# Patient Record
Sex: Male | Born: 1935 | Race: White | Hispanic: No | Marital: Married | Smoking: Former smoker
Health system: Southern US, Community
[De-identification: ages and names within clinical notes are randomized; demographics above are authoritative.]

## PROBLEM LIST (undated history)

## (undated) DIAGNOSIS — N189 Chronic kidney disease, unspecified: Secondary | ICD-10-CM

## (undated) DIAGNOSIS — F17201 Nicotine dependence, unspecified, in remission: Secondary | ICD-10-CM

## (undated) DIAGNOSIS — Z794 Long term (current) use of insulin: Secondary | ICD-10-CM

## (undated) DIAGNOSIS — I10 Essential (primary) hypertension: Secondary | ICD-10-CM

## (undated) DIAGNOSIS — E785 Hyperlipidemia, unspecified: Secondary | ICD-10-CM

## (undated) DIAGNOSIS — E119 Type 2 diabetes mellitus without complications: Secondary | ICD-10-CM

## (undated) DIAGNOSIS — I2510 Atherosclerotic heart disease of native coronary artery without angina pectoris: Secondary | ICD-10-CM

## (undated) DIAGNOSIS — N400 Enlarged prostate without lower urinary tract symptoms: Secondary | ICD-10-CM

## (undated) DIAGNOSIS — I480 Paroxysmal atrial fibrillation: Secondary | ICD-10-CM

## (undated) DIAGNOSIS — N200 Calculus of kidney: Secondary | ICD-10-CM

## (undated) HISTORY — DX: Paroxysmal atrial fibrillation: I48.0

## (undated) HISTORY — DX: Hyperlipidemia, unspecified: E78.5

## (undated) HISTORY — DX: Type 2 diabetes mellitus without complications: E11.9

## (undated) HISTORY — PX: OTHER SURGICAL HISTORY: SHX169

## (undated) HISTORY — DX: Essential (primary) hypertension: I10

## (undated) HISTORY — DX: Type 2 diabetes mellitus without complications: Z79.4

## (undated) HISTORY — DX: Nicotine dependence, unspecified, in remission: F17.201

## (undated) HISTORY — DX: Benign prostatic hyperplasia without lower urinary tract symptoms: N40.0

## (undated) HISTORY — DX: Atherosclerotic heart disease of native coronary artery without angina pectoris: I25.10

## (undated) HISTORY — DX: Calculus of kidney: N20.0

## (undated) HISTORY — DX: Chronic kidney disease, unspecified: N18.9

## (undated) HISTORY — PX: TRANSURETHRAL RESECTION OF PROSTATE: SHX73

---

## 2001-04-01 ENCOUNTER — Encounter: Payer: Self-pay | Admitting: Cardiology

## 2001-05-13 ENCOUNTER — Ambulatory Visit (HOSPITAL_COMMUNITY): Admission: RE | Admit: 2001-05-13 | Discharge: 2001-05-13 | Payer: Self-pay | Admitting: Cardiology

## 2002-01-20 ENCOUNTER — Ambulatory Visit (HOSPITAL_COMMUNITY): Admission: RE | Admit: 2002-01-20 | Discharge: 2002-01-20 | Payer: Self-pay | Admitting: Cardiology

## 2004-02-03 ENCOUNTER — Ambulatory Visit: Payer: Self-pay | Admitting: Cardiology

## 2004-02-04 ENCOUNTER — Ambulatory Visit (HOSPITAL_COMMUNITY): Admission: RE | Admit: 2004-02-04 | Discharge: 2004-02-04 | Payer: Self-pay | Admitting: Cardiology

## 2004-03-24 ENCOUNTER — Inpatient Hospital Stay (HOSPITAL_COMMUNITY): Admission: AD | Admit: 2004-03-24 | Discharge: 2004-03-30 | Payer: Self-pay | Admitting: Family Medicine

## 2004-04-20 ENCOUNTER — Ambulatory Visit (HOSPITAL_COMMUNITY): Admission: RE | Admit: 2004-04-20 | Discharge: 2004-04-20 | Payer: Self-pay | Admitting: Family Medicine

## 2004-05-01 ENCOUNTER — Ambulatory Visit (HOSPITAL_COMMUNITY): Admission: RE | Admit: 2004-05-01 | Discharge: 2004-05-01 | Payer: Self-pay | Admitting: Family Medicine

## 2005-02-08 ENCOUNTER — Ambulatory Visit: Payer: Self-pay | Admitting: Cardiology

## 2005-08-12 IMAGING — CT CT CHEST W/ CM
1 of 2 series · 14 of 30 positions shown, 18 images · IV contrast (CONTRAST)
Comparison: Prior chest radiograph on 03/27/2004.

CLINICAL DATA: Recent pneumonia.  Right hilar mass seen on recent chest radiograph. 
 CHEST CT WITH CONTRAST:
TECHNIQUE: Multidetector CT imaging of the chest was performed during administration of 100 cc Omnipaque 300 intravenous contrast.

[Series 298: — · axial · 0.76mm/px · z∈[+1638,+1893]mm · 14 of 61 slices shown, 18 images]
[im 5/61  mediastinal]
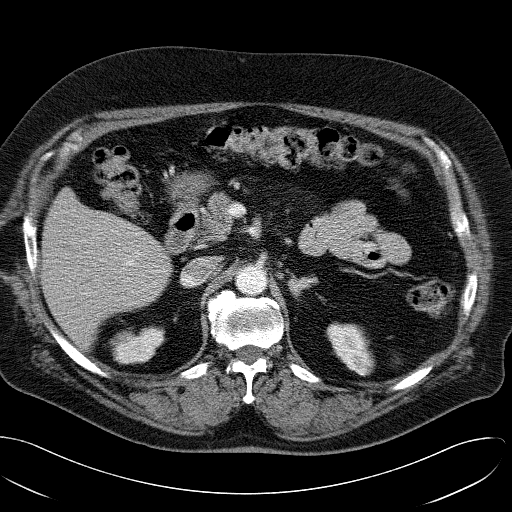
[im 5/61  lung]
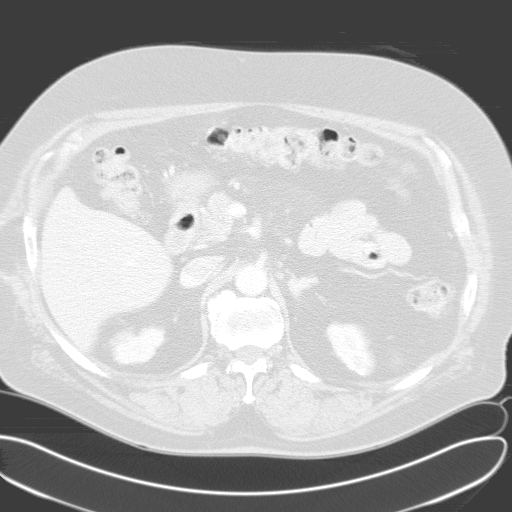
[im 9/61  lung]
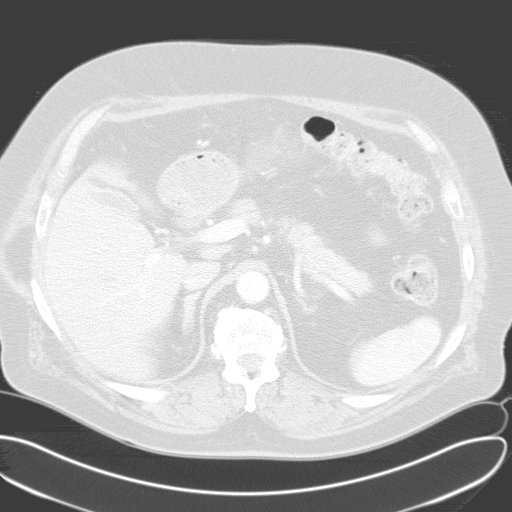
[im 13/61  lung]
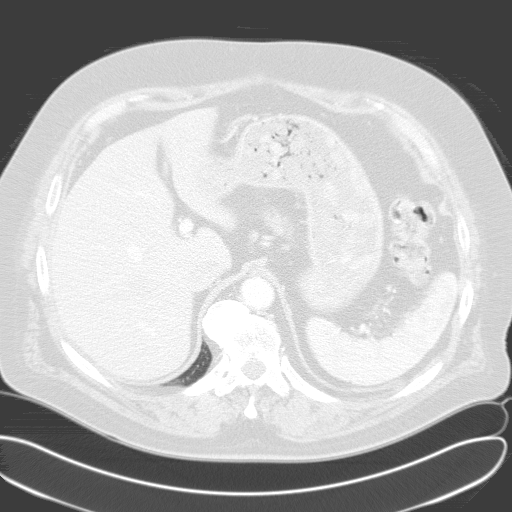
[im 18/61  lung]
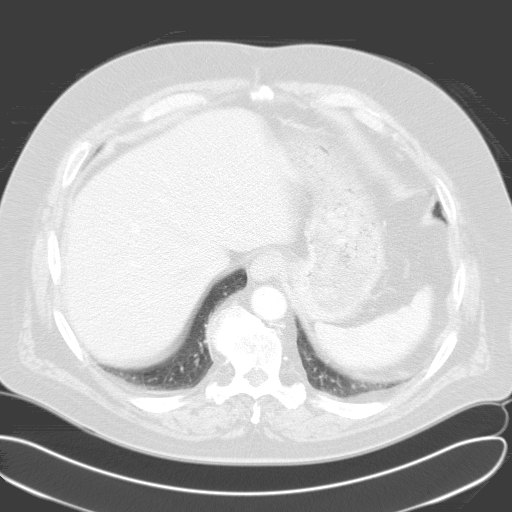
[im 22/61  mediastinal]
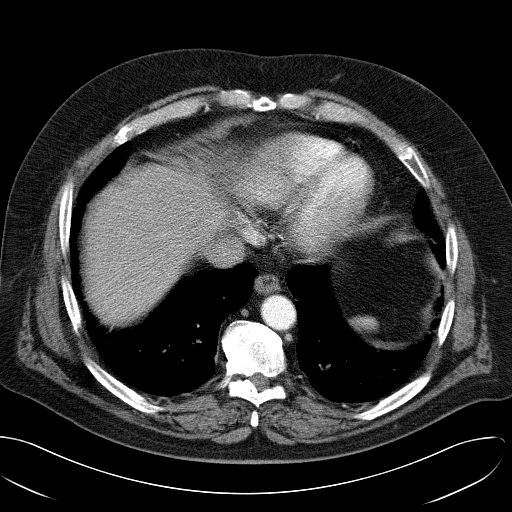
[im 22/61  lung]
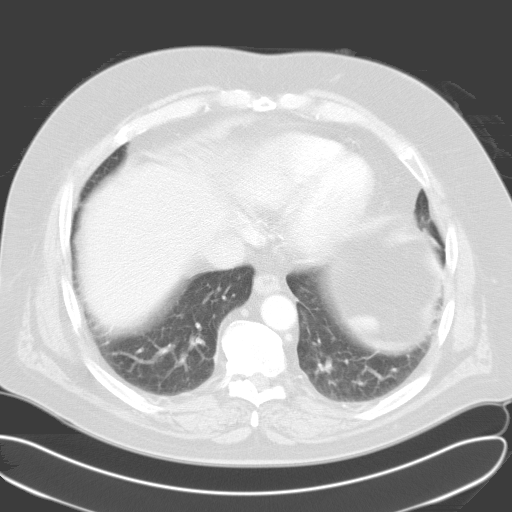
[im 26/61  lung]
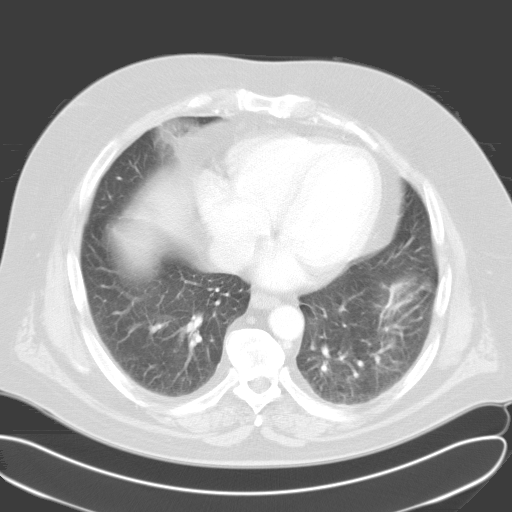
[im 29/61  lung]
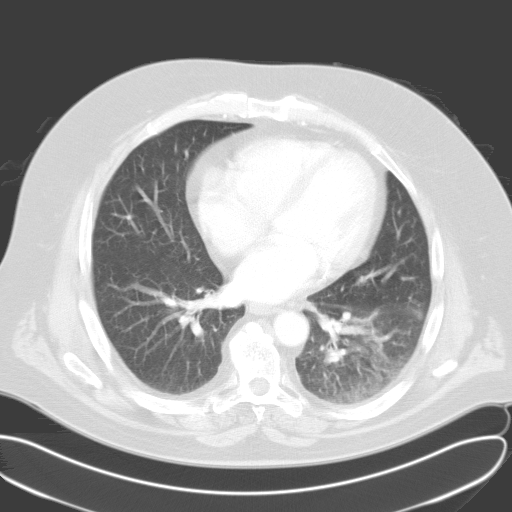
[im 31/61  lung]
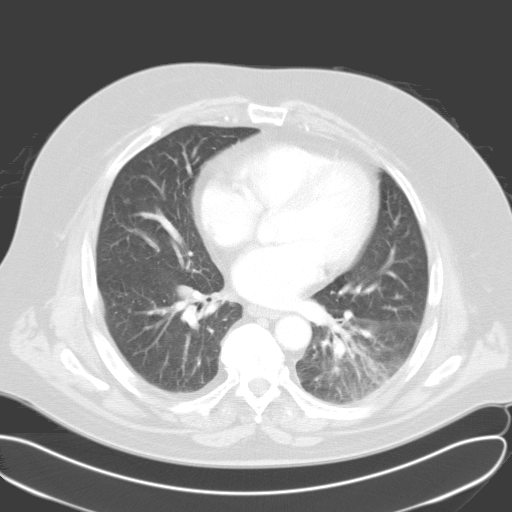
[im 35/61  mediastinal]
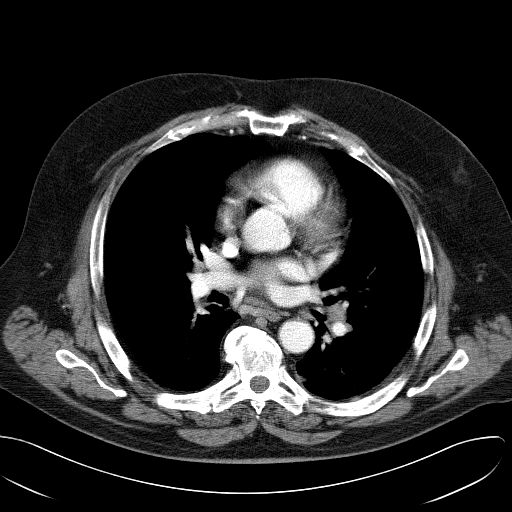
[im 35/61  lung]
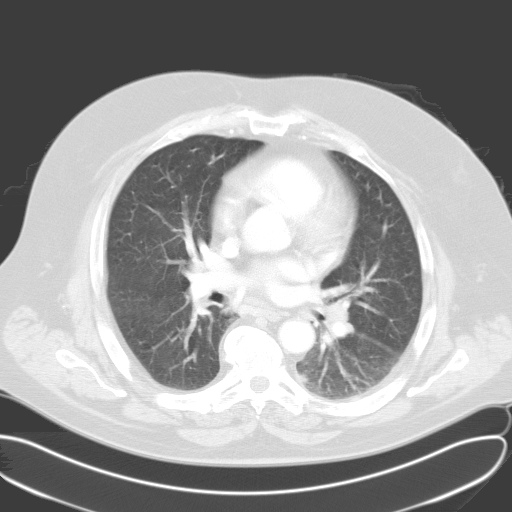
[im 39/61  lung]
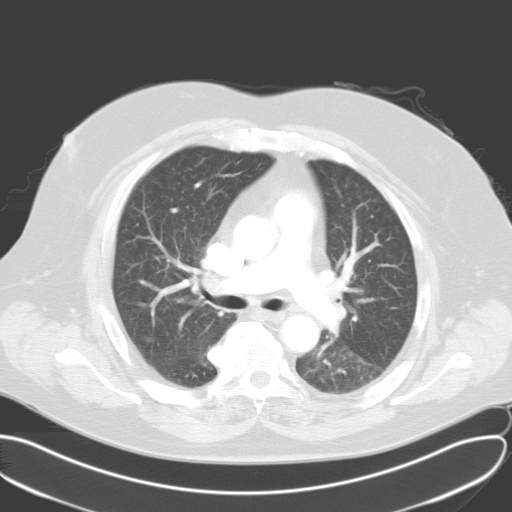
[im 43/61  lung]
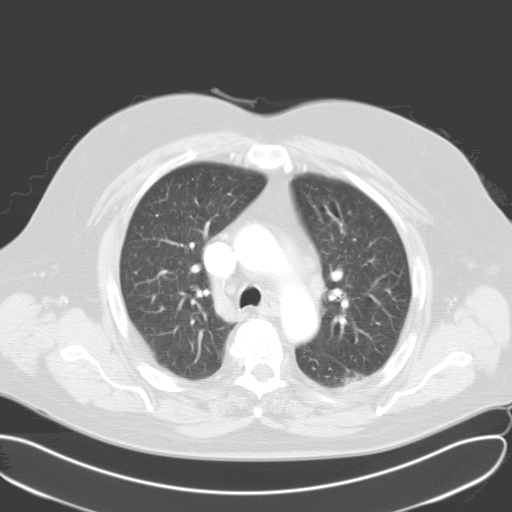
[im 48/61  lung]
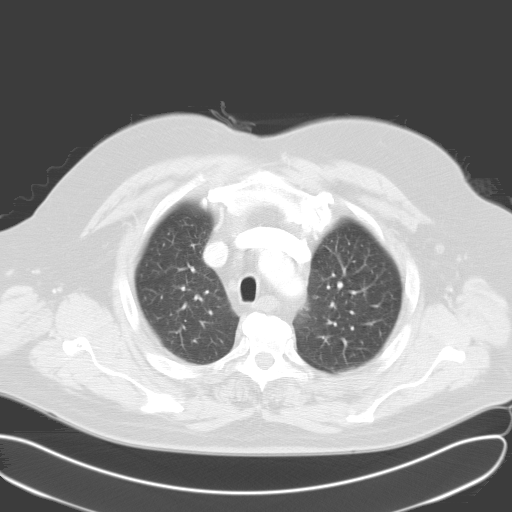
[im 52/61  mediastinal]
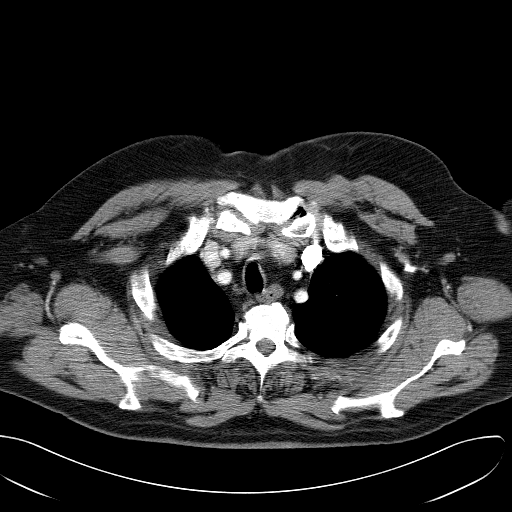
[im 52/61  lung]
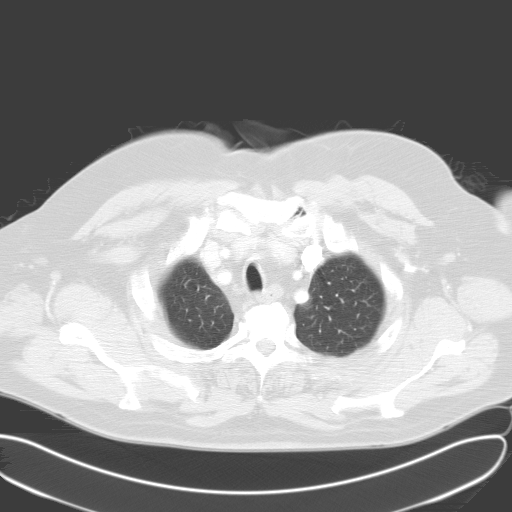
[im 56/61  lung]
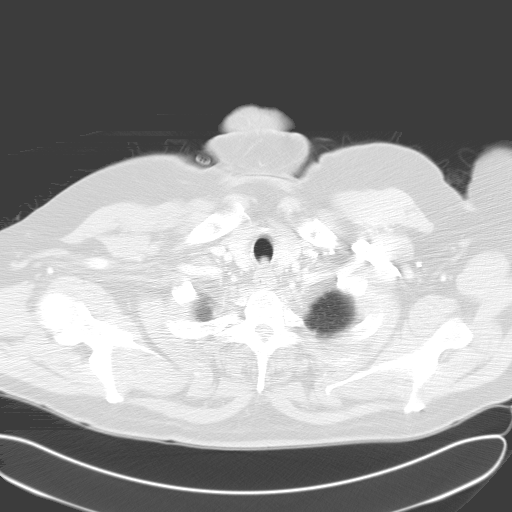

[14 of 30 positions shown; findings below may reference images not displayed]

Small hilar lymph nodes are seen bilaterally measuring up to 1 cm in maximum diameter.  Shotty small less than 1 cm mediastinal lymph nodes are also seen.  No abnormal soft tissue mass is seen in the right paratracheal or hilar region.  The right hilar soft tissue prominence seen on the previous chest radiograph is due to a large osteophyte at a right mid-thoracic costovertebral junction, superimposed over the right hilum on this prior chest radiograph.   
 Mild infiltrate is seen in the left lower lobe, likely due to pneumonia. Central tracheobronchial airways are patent and there is no evidence of obstructing mass.  There is no evidence of pleural effusion.
IMPRESSION: 1.  No evidence of mass or adenopathy.  Right hilar soft tissue prominence noted on prior chest radiograph was due to a large osteophyte at a mid thoracic costovertebral junction.  
 2.  Mild left lower lobe infiltrate, suspicious for pneumonia.  Chest radiographic followup is recommended to confirm resolution.

## 2006-02-06 ENCOUNTER — Ambulatory Visit: Payer: Self-pay | Admitting: Cardiology

## 2006-10-01 ENCOUNTER — Encounter (INDEPENDENT_AMBULATORY_CARE_PROVIDER_SITE_OTHER): Payer: Self-pay | Admitting: *Deleted

## 2006-10-01 LAB — CONVERTED CEMR LAB
Albumin: 4.5 g/dL
Alkaline Phosphatase: 66 units/L
BUN: 45 mg/dL
CO2: 24 meq/L
Calcium: 9.7 mg/dL
Cholesterol: 152 mg/dL
Glucose, Bld: 85 mg/dL
Potassium: 4.5 meq/L
Sodium: 143 meq/L
Total Protein: 7.6 g/dL
Triglycerides: 136 mg/dL

## 2007-01-14 ENCOUNTER — Ambulatory Visit (HOSPITAL_COMMUNITY): Admission: RE | Admit: 2007-01-14 | Discharge: 2007-01-14 | Payer: Self-pay | Admitting: Family Medicine

## 2007-04-03 ENCOUNTER — Ambulatory Visit: Payer: Self-pay | Admitting: Cardiology

## 2008-03-26 ENCOUNTER — Ambulatory Visit: Payer: Self-pay | Admitting: Cardiology

## 2009-03-21 ENCOUNTER — Encounter (INDEPENDENT_AMBULATORY_CARE_PROVIDER_SITE_OTHER): Payer: Self-pay | Admitting: *Deleted

## 2009-03-24 ENCOUNTER — Ambulatory Visit: Payer: Self-pay | Admitting: Cardiology

## 2009-03-28 ENCOUNTER — Encounter (INDEPENDENT_AMBULATORY_CARE_PROVIDER_SITE_OTHER): Payer: Self-pay | Admitting: *Deleted

## 2009-03-28 ENCOUNTER — Encounter: Payer: Self-pay | Admitting: Cardiology

## 2009-03-28 LAB — CONVERTED CEMR LAB
ALT: 21 units/L (ref 0–53)
AST: 22 units/L
Albumin: 4.9 g/dL (ref 3.5–5.2)
Alkaline Phosphatase: 49 units/L
Basophils Absolute: 0 10*3/uL
Basophils Relative: 0 % (ref 0–1)
CO2: 25 meq/L (ref 19–32)
Chloride: 105 meq/L
Chloride: 105 meq/L (ref 96–112)
Cholesterol: 171 mg/dL (ref 0–200)
Eosinophils Absolute: 0.1 10*3/uL
Eosinophils Relative: 2 %
Glucose, Bld: 118 mg/dL — ABNORMAL HIGH (ref 70–99)
HDL: 76 mg/dL
Hemoglobin: 15.3 g/dL
Hemoglobin: 15.3 g/dL (ref 13.0–17.0)
LDL Cholesterol: 52 mg/dL
Lymphocytes Relative: 29 %
Lymphocytes Relative: 29 % (ref 12–46)
Lymphs Abs: 1.8 10*3/uL
Lymphs Abs: 1.8 10*3/uL (ref 0.7–4.0)
MCV: 101.8 fL
Monocytes Absolute: 0.6 10*3/uL
Monocytes Absolute: 0.6 10*3/uL (ref 0.1–1.0)
Monocytes Relative: 9 % (ref 3–12)
Neutro Abs: 3.8 10*3/uL (ref 1.7–7.7)
Neutrophils Relative %: 61 % (ref 43–77)
Potassium: 4.7 meq/L
Potassium: 4.7 meq/L (ref 3.5–5.3)
RBC: 4.49 M/uL
RBC: 4.49 M/uL (ref 4.22–5.81)
Sodium: 141 meq/L (ref 135–145)
Total Bilirubin: 0.6 mg/dL (ref 0.3–1.2)
Total Protein: 8.1 g/dL (ref 6.0–8.3)
VLDL: 43 mg/dL — ABNORMAL HIGH (ref 0–40)
WBC: 6.4 10*3/uL
WBC: 6.4 10*3/uL (ref 4.0–10.5)

## 2009-03-29 ENCOUNTER — Encounter: Payer: Self-pay | Admitting: Cardiology

## 2009-10-03 DIAGNOSIS — I480 Paroxysmal atrial fibrillation: Secondary | ICD-10-CM

## 2009-10-03 HISTORY — DX: Paroxysmal atrial fibrillation: I48.0

## 2009-11-17 ENCOUNTER — Ambulatory Visit: Payer: Self-pay | Admitting: Cardiology

## 2009-11-18 ENCOUNTER — Encounter: Payer: Self-pay | Admitting: Adult Health

## 2009-11-18 ENCOUNTER — Encounter: Payer: Self-pay | Admitting: Cardiology

## 2009-11-23 ENCOUNTER — Encounter: Payer: Self-pay | Admitting: Cardiology

## 2009-11-23 ENCOUNTER — Ambulatory Visit: Payer: Self-pay | Admitting: Cardiology

## 2009-11-23 ENCOUNTER — Ambulatory Visit (HOSPITAL_COMMUNITY): Admission: RE | Admit: 2009-11-23 | Discharge: 2009-11-23 | Payer: Self-pay | Admitting: Cardiology

## 2009-11-28 ENCOUNTER — Ambulatory Visit: Payer: Self-pay | Admitting: Cardiology

## 2009-11-29 ENCOUNTER — Encounter (INDEPENDENT_AMBULATORY_CARE_PROVIDER_SITE_OTHER): Payer: Self-pay | Admitting: *Deleted

## 2009-12-02 ENCOUNTER — Ambulatory Visit: Payer: Self-pay | Admitting: Cardiology

## 2009-12-02 ENCOUNTER — Encounter (INDEPENDENT_AMBULATORY_CARE_PROVIDER_SITE_OTHER): Payer: Self-pay | Admitting: *Deleted

## 2009-12-05 ENCOUNTER — Encounter (INDEPENDENT_AMBULATORY_CARE_PROVIDER_SITE_OTHER): Payer: Self-pay | Admitting: *Deleted

## 2009-12-05 LAB — CONVERTED CEMR LAB
INR: 1.18 (ref <1.50)
Prothrombin Time: 15.2 s
TSH: 0.483 microintl units/mL
TSH: 0.483 microintl units/mL (ref 0.350–4.500)

## 2010-04-04 NOTE — Assessment & Plan Note (Signed)
Summary: 1 YR F/U PER CKOUT 03/26/08-DSF  Medications Added ASPIR-LOW 81 MG TBEC (ASPIRIN) TAKE 1 TAB DAILY LOPRESSOR 50 MG TABS (METOPROLOL TARTRATE) TAKE 1 TAB DAILY RAMIPRIL 10 MG CAPS (RAMIPRIL) TAKE 1 TAB DAILY NIASPAN 1000 MG CR-TABS (NIACIN (ANTIHYPERLIPIDEMIC)) TAKE 1 TAB DAILY CRESTOR 20 MG TABS (ROSUVASTATIN CALCIUM) TAKE 1 TAB DAILY CHLORTHALIDONE 25 MG TABS (CHLORTHALIDONE) TAKE 1 TAB DAILY LANTUS 100 UNIT/ML SOLN (INSULIN GLARGINE) TAKE 32 UNITS DAILY MELOXICAM 15 MG TABS (MELOXICAM) TAKE 1 TAB DAILY      Allergies Added: NKDA  Visit Type:  Follow-up Primary Provider:  DR.ANGUS MCINNIS   History of Present Illness: 16th annual return visit for this very nice gentleman with probable coronary artery disease based upon noninvasive studies and multiple cardiovascular risk factors.  She remains asymptomatic, never having had chest discomfort.  She reports no dyspnea on exetion, no orthopnea, PND nor pedal edema.  Blood pressure control is in good.  Diabetic control has been good with CBGs all well less than 150.  He has not had recent lab work and apparently he did not return for the lab tests ordered last year.  He has had no serious medical problems over the past 12 months.    EKG  Procedure date:  03/24/2009  Findings:      Normal sinus rhythm with fairly frequent PVCs Delayed R-wave progression. Nonspecific T wave abnormality, which is new since the previous tracing of 03/26/08   Current Medications (verified): 1)  Aspir-Low 81 Mg Tbec (Aspirin) .... Take 1 Tab Daily 2)  Lopressor 50 Mg Tabs (Metoprolol Tartrate) .... Take 1 Tab Daily 3)  Ramipril 10 Mg Caps (Ramipril) .... Take 1 Tab Daily 4)  Niaspan 1000 Mg Cr-Tabs (Niacin (Antihyperlipidemic)) .... Take 1 Tab Daily 5)  Crestor 20 Mg Tabs (Rosuvastatin Calcium) .... Take 1 Tab Daily 6)  Chlorthalidone 25 Mg Tabs (Chlorthalidone) .... Take 1 Tab Daily 7)  Lantus 100 Unit/ml Soln (Insulin Glargine) .... Take 32  Units Daily 8)  Meloxicam 15 Mg Tabs (Meloxicam) .... Take 1 Tab Daily  Allergies (verified): No Known Drug Allergies  Past History:  PMH, FH, and Social History reviewed and updated.  Past Medical History: ASCVD-evidence for previous myocardial infarction by echocardiography; stress nuclear study suggested      inferior scarring; has never undergone cardiac catheterization HYPERTENSION, UNSPECIFIED (ICD-401.9) HYPERLIPIDEMIA-MIXED (ICD-272.4) DIABETES MELLITUS (ICD-250.00)-insulin requiring Nephrolithiasis Chronic kidney disease: Creatinine of 1.5 in 11/03 and 1.8 in 10/08 Tobacco abuse: 25 pack years, discontinued in 1990     Past Surgical History: Transurethral resection of the prostate  Family History: Mother died at a young age of cause unknown Father died in an advanced stage of cause unknown Siblings: Sister developed coronary disease in her 61s  Social History: Retired  Married with 2 adult children Tobacco Use - 25 pack years; discontinued in 1990 Alcohol Use - excessive use in the 1990s  Review of Systems       The patient complains of weight loss.  The patient denies vision loss, decreased hearing, hoarseness, chest pain, syncope, dyspnea on exertion, peripheral edema, prolonged cough, headaches, abdominal pain, and melena.    Vital Signs:  Patient profile:   77 year old male Height:      69 inches Weight:      218 pounds BMI:     32.31 Pulse rate:   75 / minute BP sitting:   138 / 68  (right arm)  Vitals Entered By: Dreama Saa, CNA (March 24, 2009 1:41 PM)  Physical Exam  General:   General-Somewhat overweight; no acute distress:   Neck-No JVD; no carotid bruits: Lungs-No tachypnea, no rales; no rhonchi; no wheezes: Cardiovascular-normal PMI; normal S1 and S2: Abdomen-BS normal; soft and non-tender without masses or organomegaly:  Musculoskeletal-No deformities, no cyanosis or clubbing: Neurologic-Normal cranial nerves; symmetric strength and  tone:  Skin-Warm, ichthyosis over the lower extremities Extremities-Nl distal pulses; 1/2+  pretibial edema     Impression & Recommendations:  Problem # 1:  DIABETES MELLITUS (ICD-250.00) Control is good according to the patient's measurements at home.  He is congratulated on a 12 pound weight loss over the past year and encouraged to continue to be as active as possible and to restrict calories and concentrated sweets.  A hemoglobin A1c level will be obtained.  Problem # 2:  HYPERTENSION, UNSPECIFIED (ICD-401.9) Control is good on current medications, which will be continued.  Basic laboratory studies will be obtained to monitor his therapy, particularly with diuretic.  Problem # 3:  HYPERLIPIDEMIA-MIXED (ICD-272.4) A repeat lipid profile will be obtained.  We have provided Mr. Clearman with samples of rosuvastatin and Niaspan at his request.  I will plan to see this nice to him again in one year.  Other Orders: T-Comprehensive Metabolic Panel [phone]) T-Lipid Profile [phone]) T-CBC w/Diff [phone]) T-Hgb A1C ([id number])  Patient Instructions: 1)  Your physician recommends that you schedule a follow-up appointment in: 1 YEAR 2)  Your physician recommends that you return for lab work [id number]

## 2010-04-04 NOTE — Letter (Signed)
Summary: [place] [facility] [employer], agricultural at [employer]  [address] [place] [address]   Phone: [phone]  Fax: [phone]     December 02, 2009 MRN: [id number]   Matthew Boyd [address] [place], [place]  [address]      YOUR LAB WORK IS DUE   MONDAY    December 05, 2009  Please go to [facility], located across the street from [facility] on the second floor.  Hours are Monday - Friday 7am until 7:30pm         Saturday 8am until 12noon    __  DO NOT EAT OR DRINK AFTER MIDNIGHT EVENING PRIOR TO LABWORK  _X_ YOUR LABWORK IS NOT FASTING --YOU MAY EAT PRIOR TO LABWORK

## 2010-04-04 NOTE — Miscellaneous (Signed)
Summary: labs bmp,lipid,liver a1c,10/01/2006  Clinical Lists Changes  Observations: Added new observation of CALCIUM: 9.7 mg/dL (16/12/9602 54:09) Added new observation of ALBUMIN: 4.5 g/dL (81/19/1478 29:56) Added new observation of PROTEIN, TOT: 7.6 g/dL (21/30/8657 84:69) Added new observation of SGPT (ALT): 35 units/L (10/01/2006 15:42) Added new observation of SGOT (AST): 26 units/L (10/01/2006 15:42) Added new observation of ALK PHOS: 66 units/L (10/01/2006 15:42) Added new observation of BILI DIRECT: 0.2 mg/dL (62/95/2841 32:44) Added new observation of CREATININE: 2.05 mg/dL (03/07/7251 66:44) Added new observation of BUN: 45 mg/dL (03/47/4259 56:38) Added new observation of BG RANDOM: 85 mg/dL (75/64/3329 51:88) Added new observation of CO2 PLSM/SER: 24 meq/L (10/01/2006 15:42) Added new observation of CL SERUM: 103 meq/L (10/01/2006 15:42) Added new observation of K SERUM: 4.5 meq/L (10/01/2006 15:42) Added new observation of NA: 143 meq/L (10/01/2006 15:42) Added new observation of LDL: 62 mg/dL (41/66/0630 16:01) Added new observation of HDL: 63 mg/dL (09/32/3557 32:20) Added new observation of TRIGLYC TOT: 136 mg/dL (25/42/7062 37:62) Added new observation of CHOLESTEROL: 152 mg/dL (83/15/1761 60:73) Added new observation of HGBA1C: 6.6 % (10/01/2006 15:42)

## 2010-04-04 NOTE — Assessment & Plan Note (Signed)
Summary: rov afib will fax ekg/tmj  Medications Added LOPRESSOR 50 MG TABS (METOPROLOL TARTRATE) TAKE 1 TAB two times a day KETOROLAC TROMETHAMINE 0.4 % SOLN (KETOROLAC TROMETHAMINE) apply 1 drop each eye qid LOTEMAX 0.5 % SUSP (LOTEPREDNOL ETABONATE) apply 1 drop each eye qid      Allergies Added: NKDA  Visit Type:  Follow-up Primary Provider:  Lawrence Marseilles  CC:  no cardiology complaints.  History of Present Illness: Mr. Gracie is a very pleasant 77 y/o CM who we are seeing on follow-up at the request of Dr. Renard Matter for tachycardia.  He was seen by Dr. Renard Matter last week and was found to have rapid HR.   Rhythum strips sent over from his office demonstrate PAF.  The patient has a history of "probable" ASCVD per Dr. Marvel Plan note with multiple CVRFs to include DM, HTN, Hypercholesterolemia.  He states that he could not tell that he was in irregular rhythum when seen by Dr. Renard Matter.  He had a very stress week with the death of his niece at age 82 from cancer and had attended the funeral the day prior to the appt. with Dr. Renard Matter.  He denies chest pain, SOB, dizziness or presyncope.  Current Medications (verified): 1)  Aspir-Low 81 Mg Tbec (Aspirin) .... Take 1 Tab Daily 2)  Lopressor 50 Mg Tabs (Metoprolol Tartrate) .... Take 1 Tab Two Times A Day 3)  Ramipril 10 Mg Caps (Ramipril) .... Take 1 Tab Daily 4)  Niaspan 1000 Mg Cr-Tabs (Niacin (Antihyperlipidemic)) .... Take 1 Tab Daily 5)  Crestor 20 Mg Tabs (Rosuvastatin Calcium) .... Take 1 Tab Daily 6)  Chlorthalidone 25 Mg Tabs (Chlorthalidone) .... Take 1 Tab Daily 7)  Lantus 100 Unit/ml Soln (Insulin Glargine) .... Take 32 Units Daily 8)  Meloxicam 15 Mg Tabs (Meloxicam) .... Take 1 Tab Daily 9)  Ketorolac Tromethamine 0.4 % Soln (Ketorolac Tromethamine) .... Apply 1 Drop Each Eye Qid 10)  Lotemax 0.5 % Susp (Loteprednol Etabonate) .... Apply 1 Drop Each Eye Qid  Allergies (verified): No Known Drug Allergies  Past  History:  Past medical, surgical, family and social histories (including risk factors) reviewed, and no changes noted (except as noted below).  Past Medical History: Reviewed history from 03/24/2009 and no changes required. ASCVD-evidence for previous myocardial infarction by echocardiography; stress nuclear study suggested      inferior scarring; has never undergone cardiac catheterization HYPERTENSION, UNSPECIFIED (ICD-401.9) HYPERLIPIDEMIA-MIXED (ICD-272.4) DIABETES MELLITUS (ICD-250.00)-insulin requiring Nephrolithiasis Chronic kidney disease: Creatinine of 1.5 in 11/03 and 1.8 in 10/08 Tobacco abuse: 25 pack years, discontinued in 1990     Past Surgical History: Reviewed history from 03/24/2009 and no changes required. Transurethral resection of the prostate  Family History: Reviewed history from 03/24/2009 and no changes required. Mother died at a young age of cause unknown Father died in an advanced stage of cause unknown Siblings: Sister developed coronary disease in her 108s  Social History: Reviewed history from 03/24/2009 and no changes required. Retired  Married with 2 adult children Tobacco Use - 25 pack years; discontinued in 1990 Alcohol Use - excessive use in the 1990s  Review of Systems       All other systems have been reviewed and are negative unless stated above.   Vital Signs:  Patient profile:   77 year old male Weight:      218 pounds BMI:     32.31 Pulse rate:   68 / minute BP sitting:   141 / 59  (right arm)  Vitals Entered By: Dreama Saa, CNA (November 17, 2009 1:01 PM)  Physical Exam  General:  Well developed, well nourished, in no acute distress. Head:  normocephalic and atraumatic Eyes:  PERRLA/EOM intact; conjunctiva and lids normal. Neck:  Neck supple, no JVD. No masses, thyromegaly or abnormal cervical nodes. Lungs:  Clear bilaterally to auscultation and percussion. Heart:  Occasional irregularity, extra systole but no  persistant irregularity. Abdomen:  Bowel sounds positive; abdomen soft and non-tender without masses, organomegaly, or hernias noted. No hepatosplenomegaly. Msk:  Back normal, normal gait. Muscle strength and tone normal. Pulses:  pulses normal in all 4 extremities Extremities:  No clubbing or cyanosis. Neurologic:  Alert and oriented x 3. Psych:  Normal affect.   EKG  Procedure date:  11/17/2009  Findings:      Normal sinus rhythm with rate of:  67 bpm  Impression & Recommendations:  Problem # 1:  ATRIAL FIBRILLATION (ICD-427.31) Mr. Zwiefelhofer rhythum strip demonstrates evidence of atrial fibrillation at a controlled rate.  However today no evidence of this.  Will plan on Holter monitor as his insurance will not cover 21 day cardionet monitor.  It is hoped that if he does of PAF again, we will be able to have this documented.  He will also be planned for an ECHO  to assess LV fx and L atrial size.  He will continue his medications as directed.  Follow-up with Dr Dietrich Pates post tests for further discussion and recommendation. His updated medication list for this problem includes:    Aspir-low 81 Mg Tbec (Aspirin) .Marland Kitchen... Take 1 tab daily    Lopressor 50 Mg Tabs (Metoprolol tartrate) .Marland Kitchen... Take 1 tab two times a day  Orders: Holter Monitor (Holter Monitor)  Problem # 2:  HYPERTENSION, UNSPECIFIED (ICD-401.9) Mildy elevated on this visit. Previously 130's systolic.  Will make no changes unless he is persistantly elevated. His updated medication list for this problem includes:    Aspir-low 81 Mg Tbec (Aspirin) .Marland Kitchen... Take 1 tab daily    Lopressor 50 Mg Tabs (Metoprolol tartrate) .Marland Kitchen... Take 1 tab two times a day    Ramipril 10 Mg Caps (Ramipril) .Marland Kitchen... Take 1 tab daily    Chlorthalidone 25 Mg Tabs (Chlorthalidone) .Marland Kitchen... Take 1 tab daily  Orders: 2-D Echocardiogram (2D Echo)  Problem # 3:  HYPERLIPIDEMIA-MIXED (ICD-272.4) Being followed by Dr. Renard Matter with labs. His updated medication  list for this problem includes:    Niaspan 1000 Mg Cr-tabs (Niacin (antihyperlipidemic)) .Marland Kitchen... Take 1 tab daily    Crestor 20 Mg Tabs (Rosuvastatin calcium) .Marland Kitchen... Take 1 tab daily  Patient Instructions: 1)  Your physician recommends that you schedule a follow-up appointment in: post test 2)  Your physician recommends that you continue on your current medications as directed. Please refer to the Current Medication list given to you today. 3)  Your physician has requested that you have an echocardiogram.  Echocardiography is a painless test that uses sound waves to create images of your heart. It provides your doctor with information about the size and shape of your heart and how well your heart's chambers and valves are working.  This procedure takes approximately one hour. There are no restrictions for this procedure. 4)  Your physician has recommended that you wear a holter monitor.  Holter monitors are medical devices that record the heart's electrical activity. Doctors most often use these monitors to diagnose arrhythmias. Arrhythmias are problems with the speed or rhythm of the heartbeat. The monitor is a small, portable  device. You can wear one while you do your normal daily activities. This is usually used to diagnose what is causing palpitations/syncope (passing out).

## 2010-04-04 NOTE — Assessment & Plan Note (Signed)
Summary: 2 wk f/u per checkout on 11/17/09/tg  Medications Added AMLODIPINE BESYLATE 5 MG TABS (AMLODIPINE BESYLATE) Take one tablet by mouth daily WARFARIN SODIUM 5 MG TABS (WARFARIN SODIUM) take 1/2 tablet on M-W-F- 1 tablet all other days or as directed by anticoagulation clinic      Allergies Added: NKDA  Visit Type:  Follow-up Primary Shuronda Santino:  Lawrence Marseilles   History of Present Illness: [name] returns to the office for continuing assessment and treatment of asymptomatic atrial fibrillation.  Symptomatically, he continues to do well.  Current Medications (verified): 1)  Aspir-Low 81 Mg Tbec (Aspirin) .... Take 1 Tab Daily 2)  Lopressor 50 Mg Tabs (Metoprolol Tartrate) .... Take 1 Tab Two Times A Day 3)  Ramipril 10 Mg Caps (Ramipril) .... Take 1 Tab Daily 4)  Niaspan 1000 Mg Cr-Tabs (Niacin (Antihyperlipidemic)) .... Take 1 Tab Daily 5)  Crestor 20 Mg Tabs (Rosuvastatin Calcium) .... Take 1 Tab Daily 6)  Chlorthalidone 25 Mg Tabs (Chlorthalidone) .... Take 1 Tab Daily 7)  Lantus 100 Unit/ml Soln (Insulin Glargine) .... Take 32 Units Daily 8)  Meloxicam 15 Mg Tabs (Meloxicam) .... Take 1 Tab Daily 9)  Ketorolac Tromethamine 0.4 % Soln (Ketorolac Tromethamine) .... Apply 1 Drop Each Eye Qid 10)  Amlodipine Besylate 5 Mg Tabs (Amlodipine Besylate) .... Take One Tablet By Mouth Daily 11)  Warfarin Sodium 5 Mg Tabs (Warfarin Sodium) .... Take 1/2 Tablet On M-W-F- 1 Tablet All Other Days or As Directed By Anticoagulation Clinic  Allergies (verified): No Known Drug Allergies  Past History:  PMH, FH, and Social History reviewed and updated.  Past Medical History: ASCVD-evidence for previous myocardial infarction by echocardiography; stress nuclear study suggested      inferior scarring; has never undergone cardiac catheterization Paroxysmal atrial fibrillation-onset in 10/2009; anticoagulation HYPERTENSION, UNSPECIFIED (ICD-401.9) HYPERLIPIDEMIA-MIXED  (ICD-272.4) DIABETES MELLITUS (ICD-250.00)-insulin requiring Nephrolithiasis Chronic kidney disease: Creatinine of 1.5 in 11/03 and 1.8 in 10/08; 1.75 in 09/2009 Tobacco abuse: 25 pack years, discontinued in 1990     Review of Systems  The patient denies weight loss, weight gain, hoarseness, chest pain, syncope, dyspnea on exertion, peripheral edema, prolonged cough, headaches, and abdominal pain.    Vital Signs:  Patient profile:   77 year old male Weight:      219 pounds BMI:     32.46 Pulse rate:   62 / minute BP sitting:   150 / 70  (right arm)  Vitals Entered By: Dreama Saa, CNA (December 02, 2009 2:02 PM)  Physical Exam  General:  Overweight; well developed; no acute distress:   Neck-No JVD; no carotid bruits: Lungs-No tachypnea, no rales; no rhonchi; no wheezes: Cardiovascular-normal PMI; normal S1 and S2; prominent S4 Abdomen-BS normal; soft and non-tender without masses or organomegaly:  Musculoskeletal-No deformities, no cyanosis or clubbing: Neurologic-Normal cranial nerves; symmetric strength and tone:  Skin-Warm, no significant lesions: Extremities-Nl distal pulses; no edema:     Impression & Recommendations:  Problem # 1:  ATRIAL FIBRILLATION-PAROXYSMAL (ICD-427.31) His arrhythmia is paroxysmal, initially documented on an EKG performed routinely during a primary care visit, but subsequently not present at his initial cardiology evaluation, at this followup and on a 48 hour Holter monitor examination.  The fact that AF was captured at a routine visit suggests that he spends a substantial amount of time in that rhythm.  He has a CHADS score of 2 indicating the need for anticoagulation, which has been started.  As the result of issues regarding his co-pay,  anticoagulation will be managed from Dr. Renard Matter' office.  We will obtain his first INR on Monday after 3 days of treatment with 5 mg of Coumadin per day.  Subsequent to that, he will start a regimen of 5 mg 4  days per week and 2.5 mg 3 days per week.  Problem # 2:  HYPERTENSION (ICD-401.9) Blood pressure is suboptimal, especially for a diabetic, as was the case during his first visit here.  Amlodipine 5 mg q.d. will be added to his antihypertensive medications.  Other Orders: Future Orders: T-TSH ([id number]) ... 12/05/2009 T-Protime, Auto ([id number]) ... 12/05/2009  Patient Instructions: 1)  Your physician recommends that you schedule a follow-up appointment in: 4 months 2)  Your physician recommends that you return for lab work in: Monday 3)  Your physician has recommended you make the following change in your medication: warfarin 5mg    take 1/2 tablet on MWF and 1 tablet all other days, amlodipine 5mg  daily Prescriptions: WARFARIN SODIUM 5 MG TABS (WARFARIN SODIUM) take 1/2 tablet on M-W-F- 1 tablet all other days or as directed by anticoagulation clinic  #60 x 1   Entered by:   Teressa Lower RN   Authorized by:   Kathlen Brunswick, MD, [facility]   Signed by:   Teressa Lower RN on 12/02/2009   Method used:   Electronically to        [facility] [phone][facility] (retail)       [address]       [place], [place]  [address]       Ph: [phone] or [phone]       Fax: [phone]   RxID:   [id number] AMLODIPINE BESYLATE 5 MG TABS (AMLODIPINE BESYLATE) Take one tablet by mouth daily  #30 x 3   Entered by:   Teressa Lower RN   Authorized by:   Kathlen Brunswick, MD, [facility]   Signed by:   Teressa Lower RN on 12/02/2009   Method used:   Electronically to        [facility] [phone]* (retail)       [address]       [place], [place]  [address]       Ph: [phone] or [phone]       Fax: [phone]   RxID:   [id number]

## 2010-04-04 NOTE — Miscellaneous (Signed)
Summary: LABS CBCD,CMP,LIPIDS,A1C,03/28/2009  Clinical Lists Changes  Observations: Added new observation of CALCIUM: 10.2 mg/dL (16/12/9602 54:09) Added new observation of ALBUMIN: 4.9 g/dL (81/19/1478 29:56) Added new observation of PROTEIN, TOT: 8.1 g/dL (21/30/8657 84:69) Added new observation of SGPT (ALT): 21 units/L (03/28/2009 11:39) Added new observation of SGOT (AST): 22 units/L (03/28/2009 11:39) Added new observation of ALK PHOS: 49 units/L (03/28/2009 11:39) Added new observation of CREATININE: 1.75 mg/dL (62/95/2841 32:44) Added new observation of BUN: 46 mg/dL (03/07/7251 66:44) Added new observation of BG RANDOM: 118 mg/dL (03/47/4259 56:38) Added new observation of CO2 PLSM/SER: 25 meq/L (03/28/2009 11:39) Added new observation of CL SERUM: 105 meq/L (03/28/2009 11:39) Added new observation of K SERUM: 4.7 meq/L (03/28/2009 11:39) Added new observation of NA: 141 meq/L (03/28/2009 11:39) Added new observation of LDL: 52 mg/dL (75/64/3329 51:88) Added new observation of HDL: 76 mg/dL (41/66/0630 16:01) Added new observation of TRIGLYC TOT: 215 mg/dL (09/32/3557 32:20) Added new observation of CHOLESTEROL: 171 mg/dL (25/42/7062 37:62) Added new observation of HGBA1C: 7.3 % (03/28/2009 11:39) Added new observation of ABSOLUTE BAS: 0.0 K/uL (03/28/2009 11:39) Added new observation of BASOPHIL %: 0 % (03/28/2009 11:39) Added new observation of EOS ABSLT: 0.1 K/uL (03/28/2009 11:39) Added new observation of % EOS AUTO: 2 % (03/28/2009 11:39) Added new observation of ABSOLUTE MON: 0.6 K/uL (03/28/2009 11:39) Added new observation of MONOCYTE %: 9 % (03/28/2009 11:39) Added new observation of ABS LYMPHOCY: 1.8 K/uL (03/28/2009 11:39) Added new observation of LYMPHS %: 29 % (03/28/2009 11:39) Added new observation of PLATELETK/UL: 176 K/uL (03/28/2009 11:39) Added new observation of RDW: 14.4 % (03/28/2009 11:39) Added new observation of MCHC RBC: 33.5 g/dL (83/15/1761  60:73) Added new observation of MCV: 101.8 fL (03/28/2009 11:39) Added new observation of HCT: 45.7 % (03/28/2009 11:39) Added new observation of HGB: 15.3 g/dL (71/08/2692 85:46) Added new observation of RBC M/UL: 4.49 M/uL (03/28/2009 11:39) Added new observation of WBC COUNT: 6.4 10*3/microliter (03/28/2009 11:39)

## 2010-04-04 NOTE — Letter (Signed)
Summary: [place] Results Engineer, agricultural at [facility]  [address] [place] [address]   Phone: [phone]  Fax: [phone]      March 29, 2009 MRN: [id number]   Matthew Boyd [address] [place], [place]  [address]   Dear Mr. Rhodus,  Your test ordered by Selena Batten has been reviewed by your physician (or physician assistant) and was found to be normal or stable. Your physician (or physician assistant) felt no changes were needed at this time.  ____ Echocardiogram  ____ Cardiac Stress Test  __X__ Lab Work  ____ Peripheral vascular study of arms, legs or neck  ____ CT scan or X-ray  ____ Lung or Breathing test  ____ Other:   Thank you.   Corcoran Bing, MD, F.A.C.C

## 2010-04-12 ENCOUNTER — Encounter (INDEPENDENT_AMBULATORY_CARE_PROVIDER_SITE_OTHER): Payer: Self-pay | Admitting: *Deleted

## 2010-04-14 ENCOUNTER — Encounter: Payer: Self-pay | Admitting: Cardiology

## 2010-04-14 ENCOUNTER — Encounter (INDEPENDENT_AMBULATORY_CARE_PROVIDER_SITE_OTHER): Payer: Self-pay | Admitting: *Deleted

## 2010-04-14 ENCOUNTER — Ambulatory Visit (INDEPENDENT_AMBULATORY_CARE_PROVIDER_SITE_OTHER): Payer: MEDICARE | Admitting: Cardiology

## 2010-04-14 DIAGNOSIS — I10 Essential (primary) hypertension: Secondary | ICD-10-CM

## 2010-04-14 DIAGNOSIS — I4891 Unspecified atrial fibrillation: Secondary | ICD-10-CM

## 2010-04-14 DIAGNOSIS — I2510 Atherosclerotic heart disease of native coronary artery without angina pectoris: Secondary | ICD-10-CM

## 2010-04-17 ENCOUNTER — Ambulatory Visit (INDEPENDENT_AMBULATORY_CARE_PROVIDER_SITE_OTHER): Payer: MEDICARE

## 2010-04-17 DIAGNOSIS — R0989 Other specified symptoms and signs involving the circulatory and respiratory systems: Secondary | ICD-10-CM

## 2010-04-17 LAB — CONVERTED CEMR LAB
AST: 24 units/L (ref 0–37)
Albumin: 5.2 g/dL (ref 3.5–5.2)
Alkaline Phosphatase: 58 units/L (ref 39–117)
BUN: 44 mg/dL — ABNORMAL HIGH (ref 6–23)
Calcium: 9.9 mg/dL (ref 8.4–10.5)
Chloride: 104 meq/L (ref 96–112)
Eosinophils Absolute: 0.1 10*3/uL (ref 0.0–0.7)
Glucose, Bld: 169 mg/dL — ABNORMAL HIGH (ref 70–99)
HDL: 50 mg/dL (ref >39)
LDL Cholesterol: 82 mg/dL (ref 0–99)
Lymphs Abs: 1.9 10*3/uL (ref 0.7–4.0)
Monocytes Relative: 7 % (ref 3–12)
Neutro Abs: 5.7 10*3/uL (ref 1.7–7.7)
Neutrophils Relative %: 69 % (ref 43–77)
Platelets: 188 10*3/uL (ref 150–400)
Potassium: 5.1 meq/L (ref 3.5–5.3)
RBC: 4.79 M/uL (ref 4.22–5.81)
Sodium: 138 meq/L (ref 135–145)
Total Protein: 7.8 g/dL (ref 6.0–8.3)
Triglycerides: 104 mg/dL (ref <150)
WBC: 8.3 10*3/uL (ref 4.0–10.5)

## 2010-04-18 ENCOUNTER — Encounter (INDEPENDENT_AMBULATORY_CARE_PROVIDER_SITE_OTHER): Payer: Self-pay | Admitting: *Deleted

## 2010-04-18 LAB — CONVERTED CEMR LAB: OCCULT 1: NEGATIVE

## 2010-04-20 ENCOUNTER — Encounter (INDEPENDENT_AMBULATORY_CARE_PROVIDER_SITE_OTHER): Payer: Self-pay | Admitting: *Deleted

## 2010-04-20 NOTE — Miscellaneous (Signed)
Summary: labs tsh,pt,12/05/2009  Clinical Lists Changes  Observations: Added new observation of TSH: 0.483 microintl units/mL (12/05/2009 9:08) Added new observation of PT PATIENT: 15.2 s (12/05/2009 9:08)

## 2010-04-20 NOTE — Letter (Signed)
Summary: [place] [employer] Lab Work Engineer, agricultural at [facility]  [address] [place] [address]   Phone: [phone]  Fax: [phone]     April 14, 2010 MRN: [id number]   Matthew Boyd [address] [place], [place]  [address]      YOUR LAB WORK IS DUE   MONDAY,  April 17, 2010  Please go to [facility], located across the street from [facility] on the second floor.  Hours are Monday - Friday 7am until 7:30pm         Saturday 8am until 12noon    X__  DO NOT EAT OR DRINK AFTER MIDNIGHT EVENING PRIOR TO LABWORK

## 2010-04-26 NOTE — Assessment & Plan Note (Signed)
Summary: f45m per pt. checkout 12/02/09/tmj sch per tp wife call/tmj/lv  Medications Added LOPRESSOR 50 MG TABS (METOPROLOL TARTRATE) take 1 1/2 tablets by mouth two times a day LOPRESSOR 50 MG TABS (METOPROLOL TARTRATE) take 1 tablets by mouth two times a day AMLODIPINE BESYLATE 5 MG TABS (AMLODIPINE BESYLATE) take 2 tablets by mouth daily AMLODIPINE BESYLATE 10 MG TABS (AMLODIPINE BESYLATE) take 1 tablet by mouth once daily      Allergies Added: NKDA  Visit Type:  Follow-up Primary Provider:  Dr. Butch Boyd   History of Present Illness: [name] returns to the office for continued assessment and treatment of paroxysmal atrial fibrillation and presumed coronary artery disease.  Since last visit, he has done fine.  He denies chest discomfort, dyspnea, orthopnea, PND, pedal edema, lightheadedness or syncope with his usual sedentary lifestyle.  He does not assess his blood pressures outside of physicians offices.  A recent determination by Dr. Renard Boyd was similar to today's reading.  Preventive Screening-Counseling & Management  Alcohol-Tobacco     Smoking Status: quit  Current Medications (verified): 1)  Lopressor 50 Mg Tabs (Metoprolol Tartrate) .... Take 1 Tablets By Mouth Two Times A Day 2)  Ramipril 10 Mg Caps (Ramipril) .... Take 1 Tab Daily 3)  Niaspan 1000 Mg Cr-Tabs (Niacin (Antihyperlipidemic)) .... Take 1 Tab Daily 4)  Crestor 20 Mg Tabs (Rosuvastatin Calcium) .... Take 1 Tab Daily 5)  Chlorthalidone 25 Mg Tabs (Chlorthalidone) .... Take 1 Tab Daily 6)  Lantus 100 Unit/ml Soln (Insulin Glargine) .... Take 32 Units Daily 7)  Meloxicam 15 Mg Tabs (Meloxicam) .... Take 1 Tab Daily 8)  Amlodipine Besylate 10 Mg Tabs (Amlodipine Besylate) .... Take 1 Tablet By Mouth Once Daily 9)  Warfarin Sodium 5 Mg Tabs (Warfarin Sodium) .... Take 1/2 Tablet On M-W-F- 1 Tablet All Other Days or As Directed By Anticoagulation Clinic  Allergies (verified): No Known Drug  Allergies  Comments:  Nurse/Medical Assistant: The patient's medications and allergies were reviewed with the patient and were updated in the Medication and Allergy Lists. Matthew Boyd CMA (April 14, 2010 1:39 PM)  Past History:  PMH, FH, and Social History reviewed and updated.  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  Vital Signs:  Patient profile:   77 year old male Height:      69 inches Weight:      225 pounds O2 Sat:      98 % on Room air Pulse rate:   72 / minute BP sitting:   152 / 70  (left arm)  Vitals Entered By: Matthew Lower RN (April 14, 2010 1:34 PM)  O2 Flow:  Room air  Physical Exam  General:  Overweight; well developed; no acute distress:   Neck-No JVD; no carotid bruits: Lungs-No tachypnea, no rales; no rhonchi; no wheezes: Cardiovascular-normal PMI; normal S1 and S2; Abdomen-BS normal; soft and non-tender without masses or organomegaly:  Musculoskeletal-No deformities, no cyanosis or clubbing: Neurologic-Normal cranial nerves; symmetric strength and tone:  Skin-Warm, no significant lesions: Extremities-bounding distal pulses; no edema:     Impression & Recommendations:  Problem # 1:  ANTICOAGULATION (ICD-V58.61) Patient is doing well  with Coumadin with no evidence for blood loss.  CBC and stool for Hemoccult testing will be obtained to verify this.  Patient has never undergone colonoscopy, and Dr. Renard Boyd may wish to refer him for a screening procedure.  Problem # 2:  ATRIAL FIBRILLATION-PAROXYSMAL (ICD-427.31) Rhythm is regular at this visit.   Patient continues to deny palpitations, but likely is experiencing asymptomatic episodes.  Problem # 3:  HYPERTENSION (ICD-401.9) Blood pressure control remains suboptimal.  Dose of amlodipine will be increased to 10 mg q.d. and metoprolol to 75 mg b.i.d.  Matthew Boyd will return in one month for reassessment by the cardiology nurses.  Problem # 4:  HYPERLIPIDEMIA (ICD-272.4) Lipids were adequately controlled when assessed one year ago.  A repeat study will be obtained.  CHOL: 171 (03/28/2009)   LDL: 52 (03/28/2009)   HDL: 76 (03/28/2009)   TG: 215 (03/28/2009)  Other Orders: Future Orders: T-Comprehensive Metabolic Panel ([id number]) ... 04/17/2010 T-CBC w/Diff ([id number]) ... 04/17/2010 T-Lipid Profile [phone]) ... 04/17/2010  Patient Instructions: 1)  Your physician recommends that you schedule a follow-up appointment in: 10 MONTHS 2)  Your physician recommends that you return for lab work in: NEXT WEEK 3)  Your physician has asked that you test your stool for blood. It is necessary to test 3 different stool specimens for accuracy. You will be given 3 hemoccult cards for specimen collection. For each stool specimen, place a small portion of stool sample (from 2 different areas of the stool) into the 2 squares on the card. Close card. Repeat with 2 more stool specimens. Bring the cards back to the office for testing. 4)  Your physician has recommended you make the following change in your medication: increase lopressor to 1 1/2 tablets by mouth two times a day,increase amlodipine to 2 tablets daily Prescriptions: AMLODIPINE BESYLATE 10 MG TABS (AMLODIPINE BESYLATE) take 1 tablet by mouth once daily  #90 x 3   Entered by:   Matthew Boyd   Authorized by:   Matthew Brunswick, MD, Matthew Boyd   Signed by:   Matthew Boyd on 57/84/6962   Method used:   Electronically to        [facility] [phone]* (retail)       [address]       [place], [place]  [address]       Ph:  [phone]       Fax: [phone]   RxID:   [id number] LOPRESSOR 50 MG TABS (METOPROLOL TARTRATE) take 1 1/2 tablets by mouth two times a day  #90 x 3   Entered by:   Matthew Lower RN   Authorized by:   Matthew Brunswick, MD, [facility]   Signed by:   Matthew Lower RN on 04/14/2010   Method used:   Electronically to        [facility] [phone]* (retail)       [address]       [place], [place]  [address]       Ph: [phone]       Fax: [phone]   RxID:   [id number]

## 2010-04-26 NOTE — Miscellaneous (Signed)
Summary: HEMOCCULT CARDS 04/18/2010  Clinical Lists Changes  Observations: Added new observation of HEMOCCULT 2: NEG (04/18/2010 11:40) Added new observation of HEMOCCULT 1: NEG (04/18/2010 11:40)

## 2010-07-18 NOTE — Letter (Signed)
April 03, 2007    Angus G. Renard Matter, MD  [address]  [place], [place] [address]   RE:  BENECIO, KLUGER  MRN:  [id number]  /  DOB:  1933-11-28   Dear Matthew Boyd:   Mr. Jaime returns to the office for continued assessment and treatment  of probable coronary disease and multiple cardiovascular risk factors  plus stage III chronic renal insufficiency.  Since his last visit, he  has done quite well.  He reports no major illnesses over the past 12  months and no requirement for urgent medical care.  Diabetic control has  been good; in fact, he is using only extended-release insulin where  previously he required short-acting insulin as well.  Control of  hypertension has apparently been good.  I have laboratory studies from  late October.  These were generally good except that the creatinine was  somewhat further elevated to 1.79.  Lipid profile was excellent.   MEDICATIONS:  1. Aspirin 81 mg daily.  2. Atorvastatin 40 mg daily.  3. Metoprolol 50 mg b.i.d.  4. Ezetimibe 10 mg daily.  5. Lantus insulin 25 units daily.  6. Chlorthalidone 25 mg daily.   EXAM:  A very pleasant gentleman in no acute distress.  The weight is 230, 4 pounds less than last year.  Blood pressure 125/65  heart rate 62 and regular, respirations 18.  NECK:  No jugular venous distention; normal carotid upstrokes without  bruits.  LUNGS:  Clear.  CARDIAC:  Normal first and second heart sounds.  ABDOMEN:  Soft and nontender; no organomegaly.  EXTREMITIES:  Trace edema; distal pulses intact.   IMPRESSION:  Mr. Hoskin is doing extremely well from a symptomatic  standpoint.  Cardiovascular risk factors are under good control.  I  would recommend resuming Altace even though he has had mild renal  insufficiency.  This will tend to preserve renal function over the long  term, although there may be some short-term deterioration.  I will plan  to see at this nice gentleman again in 1 year.  Vaccinations are  up-to-  date.    Sincerely,      Gerrit Friends. Dietrich Pates, MD, [facility]  Electronically Signed    RMR/MedQ  DD: 04/03/2007  DT: 04/04/2007  Job #: [id number]

## 2010-07-18 NOTE — Letter (Signed)
March 26, 2008    Angus G. Renard Matter, MD  [address]  [place], [place] [address]   RE:  Matthew Boyd, Matthew Boyd  MRN:  [id number]  /  DOB:  1933-04-05   Dear Matthew Boyd,   Matthew Boyd returns to the office for his fifteenth annual visit.  Over  that interval, he has done superbly.  He is relatively sedentary now  that it is cold outside, but was walking this fall without any  cardiopulmonary symptoms.  Blood pressure control has been good.  He has  not had recent laboratory studies.  Diabetic control has been good as  far as he knows.  He has had chronic renal disease in the past with a  last measured creatinine of 1.79 in October 2008.   Current medications include:  1. Aspirin 81 mg daily.  2. Lantus insulin 25 units daily.  3. Metoprolol 50 mg b.i.d.  4. Chlorthalidone 25 mg daily.  5. Rosuvastatin 20 mg daily.  6. Niaspan ER 500 mg nightly.   Matthew Boyd has recently had symptoms of an URI.  He has not noted any  wheezing or significant dyspnea.  Symptoms are improving.  He has some  residual nonproductive cough.   PHYSICAL EXAMINATION:  GENERAL:  Pleasant overweight gentleman in no  acute distress.  VITAL SIGNS:  The blood pressure is 135/75, heart rate 75 and regular,  respirations 12 and unlabored.  NECK:  No jugular venous distention; no carotid bruits.  LUNGS:  No rales; prolonged expiratory phase with minor expiratory  wheezing.  CARDIAC:  Normal first and second heart sounds; normal PMI.  ABDOMEN:  Soft and nontender; no organomegaly.  EXTREMITIES:  Distal pulses intact; no edema.   EKG:  Normal sinus rhythm; within normal limits.  No significant change  when compared to April 01, 2001.   IMPRESSION:  Matthew Boyd is doing generally well.  He has not had a  history of chronic lung disease or asthma.  The findings on exam  probably reflect his upper respiratory illness.  Blood pressure control  is excellent.  Lipids and diabetes have been well controlled in the  past.  He will require repeat laboratories.  He is congratulated on  taking excellent care of himself, advised to attempt to lose some  weight, and asked to return in 1 year for repeat evaluation.    Sincerely,      Gerrit Friends. Dietrich Pates, MD, [facility]  Electronically Signed    RMR/MedQ  DD: 03/26/2008  DT: 03/27/2008  Job #: [id number]

## 2010-07-21 NOTE — Group Therapy Note (Signed)
NAME:  Matthew Boyd, Matthew Boyd              ACCOUNT NO.:  [id number]   MEDICAL RECORD NO.:  [id number]          PATIENT TYPE:  INP   LOCATION:  A211                          FACILITY:  [facility]   PHYSICIAN:  Angus G. Renard Matter, MD   DATE OF BIRTH:  13-Feb-1934   DATE OF PROCEDURE:  DATE OF DISCHARGE:                                   PROGRESS NOTE   This patient was admitted with hyperglycemia, impending ketoacidosis, upper  respiratory infection and possible pneumonitis.  She had a decrease in sugar  level yesterday, but then the sugars became elevated again and was placed  back on an insulin drip, and then subsequently this was changed to sliding-  scale insulin with Accu-Cheks a.c. and h.s.  The patient did have an episode  of sinus tachycardia through the night and required Lopressor.   OBJECTIVE:  VITAL SIGNS:  Blood pressure 115/61, respirations 18, pulse 75,  temp 97.7.  Blood sugars have ranged from 421 down to 275.  HEART:  Regular rhythm.  LUNGS:  Clear to P&A.  ABDOMEN:  No palpable organs or masses.   ASSESSMENT:  The patient was admitted with hyperglycemia and does have a  history of non-insulin-dependent diabetes.  Did have an upper respiratory  infection, possible pneumonia and more recent sinus tachycardia.   PLAN:  To continue current regimen.  We will attempt to get the patient on  long-acting insulin today.  We will increase dosage of Lopressor.  Obtain  EKG, cardiac enzymes, etc.     Angu   AGM/MEDQ  D:  03/26/2004  T:  03/26/2004  Job:  [id number]

## 2010-07-21 NOTE — Group Therapy Note (Signed)
NAME:  Matthew Boyd, Matthew Boyd              ACCOUNT NO.:  [id number]   MEDICAL RECORD NO.:  [id number]          PATIENT TYPE:  INP   LOCATION:  A211                          FACILITY:  [facility]   PHYSICIAN:  Angus G. Renard Matter, MD   DATE OF BIRTH:  1933-05-14   DATE OF PROCEDURE:  DATE OF DISCHARGE:                                   PROGRESS NOTE   This patient was admitted with hyperglycemia, impending ketoacidosis, upper  respiratory infection, pneumonitis and laryngitis.  He has improved and is  feeling better.  His blood sugars range from 243-375.  He was placed on long-  acting insulin.  Still is coughing intermittently.  Is afebrile.   OBJECTIVE:  VITAL SIGNS:  Blood pressure 108/53, respirations 20, pulse 82,  temp 98.  LUNGS:  Clear to P&A.  HEART:  Regular rhythm.  ABDOMEN:  No palpable organs or masses.   ASSESSMENT:  The patient was admitted with poorly controlled diabetes,  hyperglycemia and impending ketoacidosis, bronchitis and possible  pneumonitis.   PLAN:  To continue current regimen and continue Lantus insulin.  Will  gradually increase the dose of this.  Continue sliding-scale Humalog  insulin.  Repeat chest x-ray.      AGM/MEDQ  D:  03/27/2004  T:  03/27/2004  Job:  [id number]

## 2010-07-21 NOTE — Group Therapy Note (Signed)
NAME:  Matthew Boyd, Matthew Boyd              ACCOUNT NO.:  [id number]   MEDICAL RECORD NO.:  [id number]          PATIENT TYPE:  INP   LOCATION:  A211                          FACILITY:  [facility]   PHYSICIAN:  Angus G. Renard Matter, MD   DATE OF BIRTH:  04-May-1933   DATE OF PROCEDURE:  DATE OF DISCHARGE:                                   PROGRESS NOTE   This patient was admitted with hyperglycemia, impending ketoacidosis, upper  respiratory infection, possible pneumonitis, laryngitis.  He has been on an  insulin drip.  His sugars have come down in the 300 range.  He is feeling  some better.  His chemistries on admission showed a sodium of 130, potassium  5, chloride 94, CO2 23.   OBJECTIVE:  VITAL SIGNS:  Blood pressure 120/77, respirations 18, pulse 75,  temperature 97.6.  LUNGS:  Clear to P&A.  HEART:  Regular rhythm.  ABDOMEN:  No palpable organs or masses.   ASSESSMENT:  1.  The patient was admitted with hyperglycemia and impending ketoacidosis.  2.  Diabetes mellitus.  3.  Bronchitis.  4.  Possible pneumonitis.   PLAN:  1.  To continue current regimen.  2.  Continue intravenous fluids.  3.  Repeat Bmet today.     Angu   AGM/MEDQ  D:  03/25/2004  T:  03/25/2004  Job:  [id number]

## 2010-07-21 NOTE — Group Therapy Note (Signed)
NAME:  NAZIAH, WECKERLY              ACCOUNT NO.:  [id number]   MEDICAL RECORD NO.:  [id number]          PATIENT TYPE:  INP   LOCATION:  A211                          FACILITY:  [facility]   PHYSICIAN:  Angus G. Renard Matter, MD   DATE OF BIRTH:  1933/08/20   DATE OF PROCEDURE:  03/28/2004  DATE OF DISCHARGE:                                   PROGRESS NOTE   SUBJECTIVE:  This patient entered the hospital with hyperglycemia.  He does  have upper respiratory infection and is being treated for pneumonia.  He  appears to be improved.   PHYSICAL EXAMINATION:  VITAL SIGNS:  Blood pressure 111/58, respirations 20,  pulse 71, temperature 98.1.  Blood sugars have ranged from 268-354.  LUNGS:  Clear.  HEART:  Regular rhythm.  ABDOMEN:  No palpable organs or masses.   ASSESSMENT:  The patient admitted with hyperglycemia, bronchitis, possible  pneumonitis.  Plan to continue to monitor blood sugars.  Continue to advance  Lantus insulin.  A recent chest x-ray showed cardiomegaly, a questionable  hilar mass, no adenopathy.  Plan is to proceed with CT of the chest today.      AGM/MEDQ  D:  03/28/2004  T:  03/28/2004  Job:  [id number]

## 2010-07-21 NOTE — Discharge Summary (Signed)
NAME:  Matthew Boyd, Matthew Boyd              ACCOUNT NO.:  [id number]   MEDICAL RECORD NO.:  [id number]          PATIENT TYPE:  INP   LOCATION:  A211                          FACILITY:  [facility]   PHYSICIAN:  Angus G. Renard Matter, MD   DATE OF BIRTH:  [birth year]   DATE OF ADMISSION:  03/24/2004  DATE OF DISCHARGE:  01/26/2006LH                                 DISCHARGE SUMMARY   DIAGNOSES:  1.  Diabetes mellitus type 1.  2.  Hyperglycemia.  3.  Ketoacidosis.  4.  Upper respiratory infection.  5.  Bronchitis.  6.  Laryngitis.  7.  Pneumonitis.   CONDITION:  Stable and improved at the time of discharge.   HISTORY OF PRESENT ILLNESS:  This 77 year old white male was seen in the  office on the day of admission with the chief complaint being continued  hoarseness and cough, an extremely dry mouth, and listlessness.  This  patient had been seen previously and started on Levaquin 500 mg daily for an  upper respiratory infection and bronchitis.  He was checked in the office  and was found to have an extremely elevated blood sugar.  His glucose was  614.  A B-MET done showed a sodium of 128, potassium of 5.8, chloride 91,  CO2 of 24.  With these problems present we felt the patient should be  admitted to the hospital for control of his diabetes and for treatment of  his dehydration and respiratory infection.   PHYSICAL EXAMINATION:  GENERAL:  A weak appearing white male.  VITAL SIGNS:  Blood pressure 130/80, pulse 60, respirations 18, temperature  98.  HEENT:  PERRLA.  TMs negative.  Oropharynx benign.  The patient has dry  mucous membranes.  Slight nasal congestion.  NECK:  Supple.  No JVD or thyroid abnormalities.  HEART:  Regular rhythm.  No murmurs or cardiomegaly.  LUNGS:  Clear to P&A.  ABDOMEN:  No palpable organomegaly or masses.  No hepatosplenomegaly.  EXTREMITIES:  Free of edema.   LABORATORY DATA:  Admission CBC WBC 7700, hemoglobin 15.2, hematocrit 43.3.  Chemistries sodium 130,  potassium 5, chloride 94, CO2 of 23, glucose 622,  BUN 86, creatinine 2.5, calcium 9.8, total protein 9.0.  Second set of  chemistries on March 27, 2004 sodium 131, potassium 4.4, chloride 102, CO2  of 23, glucose 384, BUN 53, creatinine 2.  Creatinine on March 30, 2004  was 1.8.  CK on admission 121.  CK-MB 2.5.  Troponin 0.02.  Subsequent  cardiac markers on March 27, 2004 CK 104, CK-MB 2.  Troponin 0.01.  Urinalysis negative.  X-rays with increased markings at the left base.  Subsequent x-rays showed evidence of cardiomegaly.  Questionable right hilar  mass or adenopathy.  An electrocardiogram is with a normal sinus rhythm,  normal EKG.   HOSPITAL COURSE:  The patient, at the time of his admission, was placed on  IV normal saline at 150 cc/hour.  His blood sugars were monitored every  hour.  He was placed on an insulin drip 5 units/hour.  He was given IV  Levaquin 500 mg daily, Codiclear  liquid 1 tsp. q.4h. p.r.n. for cough, and  Tylenol 650 mg q.4h. for a temperature over 101.  He was continued on  Lipitor 40 mg daily and Humibid L.A. 1 b.i.d.  The patient was continued on  insulin drip until March 25, 2004 and was started on a sliding scale at  this point in time, Humalog insulin.  The patient was subsequently placed on  Lopressor 50 mg b.i.d. and Lantus insulin 20 units daily.  This was  gradually increased to 25 units daily.  The sliding scale was changed.  The  patient could not have a CT of the chest done to evaluate hilar adenopathy  because of elevated creatinine.  It was felt this should be done as an  outpatient later.  The patient showed progressive improvement throughout the  remainder of his hospital stay and was able to be discharged on March 30, 2004 after six days hospitalization.   DISCHARGE MEDICATIONS:  1.  Levaquin 500 mg daily.  2.  Codiclear liquid 1 tsp. q.4h. p.r.n. for cough.  3.  Lipitor 40 mg daily.  4.  Altace 10 mg daily.  5.  Lopressor 50 mg  twice a day.  6.  Lantus insulin 25 units daily.      AGM/MEDQ  D:  05/01/2004  T:  05/01/2004  Job:  [id number]

## 2010-07-21 NOTE — H&P (Signed)
NAME:  Matthew Boyd, Matthew Boyd              ACCOUNT NO.:  [id number]   MEDICAL RECORD NO.:  [id number]          PATIENT TYPE:  INP   LOCATION:  A211                          FACILITY:  [facility]   PHYSICIAN:  Angus G. Renard Matter, MD   DATE OF BIRTH:  1933/04/12   DATE OF ADMISSION:  03/24/2004  DATE OF DISCHARGE:  LH                                HISTORY & PHYSICAL   A 77 year old white male was seen in the office on the day of admission with  a chief complaint being continued hoarseness and cough, extremely dry mouth  and listlessness.  This patient had been seen previously and started on  Levaquin 500 mg daily for upper respiratory infection and bronchitis.  The  patient checked in the office and was found to have extremely elevated blood  sugar.  Subsequent Bmet done through the hospital showed a sodium of 128, a  potassium of 5.8, chloride 91, CO2 24, glucose 614, BUN 85, creatinine 2.4,  calcium 10.3.  With these problems, present, it was felt the patient should  be admitted to the hospital for control of his diabetes and for hydration  secondary to a dehydrated state.   SOCIAL HISTORY:  The patient was a former cigarette smoker.  He does not  smoke now.  He does drink alcohol on occasion.   FAMILY HISTORY:  No known diabetes or coronary artery disease.   PAST MEDICAL/SURGICAL HISTORY:  The patient has known non-insulin-dependent  diabetes with coronary artery disease with previous MI.  The patient had two  surgeries for kidney stones several years ago.  He has no known allergies.   MEDICATIONS:  1.  Levaquin 500 mg daily.  2.  Avandia 4 mg daily.  3.  Altace 10 mg daily.  4.  Aspirin 81 mg daily.  5.  Lipitor 40 mg daily.   REVIEW OF SYSTEMS:  HEENT:  Negative.  CARDIOPULMONARY:  The patient has had  some cough, dyspnea and hoarseness over the past few days.  GI:  No bowel  irregularity or bleeding.  GU:  Frequency of urination.   PHYSICAL EXAMINATION:  VITAL SIGNS:  A weak-appearing  white male with blood  pressure 130/80, pulse 60, respirations 18, temperature 98.  Weight 215.  HEENT:  Eyes PERRLA.  Tms negative.  Oropharynx:  The patient has dry mucous  membranes.  Slight nasal congestion.  NECK:  Supple.  No JVD or thyroid abnormalities.  HEART:  Regular rhythm.  No murmurs but cardiomegaly.  LUNGS:  Clear to P&A.  ABDOMEN:  No palpable organs or masses.  No hepatosplenomegaly.  EXTREMITIES:  Free of edema.  SKIN:  Warm and dry.  NEUROLOGICAL:  No focal deficits.   DIAGNOSES:  1.  Hyperglycemia with ketoacidosis.  2.  Upper respiratory infection.  3.  Bronchitis.  4.  Laryngitis.  5.  Pneumonitis.     Angu   AGM/MEDQ  D:  03/24/2004  T:  03/24/2004  Job:  [id number]

## 2010-07-21 NOTE — Letter (Signed)
February 06, 2006    Angus G. Renard Matter, MD  [address]  [place], [place] [address]   RE:  ACEYN, KATHOL  MRN:  [id number]  /  DOB:  Dec 30, 1933   Dear Thalia Party,   Mr. Riolo returns to the office for continued management of  cardiovascular risk factors.  As you know, diabetic control has been  excellent.  Blood pressure control is a little bit less clear.  His  values are typically somewhat elevated in my office.  He has had no  major medical issues over the past 12 months.  Vaccinations are up to  date.   CURRENT MEDICATIONS:  1. Aspirin 81 mg daily.  2. Ramipril 10 mg daily.  3. Atorvastatin 40 mg daily.  4. Chlorthalidone 12.5 mg daily.  5. Metoprolol 50 mg b.i.d.  6. Ezetimibe 10 mg daily.  7. Lantus insulin 25 units daily.   PHYSICAL EXAMINATION:  GENERAL:  Vigorous, pleasant gentleman in no  acute distress.  VITAL SIGNS:  Weight is 234, 6 pounds more than last year.  Blood  pressure 160/80 with a small cuff, and 140/80 with a larger cuff.  Heart  rate 76 and regular, respirations 16.  NECK:  No jugular venous distention.  Normal carotid upstrokes, without  bruits.  LUNGS:  Clear.  CARDIAC:  First and second heart sounds are normal.  Fourth heart sound  present.  Normal PMI.  ABDOMEN:  Aortic pulsation not palpable.  No masses, no organomegaly.  EXTREMITIES:  Trace edema.  Distal pulses intact.   IMPRESSION:  Mr. Zufall is doing quite well.  Control of his  hypertension is marginal.  I will ask him to increase chlorthalidone to  25 mg daily.  We will ask him to monitor blood pressure and return if  values are elevated.   Control of diabetes is excellent.  He was previously treated with  Avandia, which has been appropriately discontinued.   A recent lipid profile from your office was superb, meeting AHA  guidelines for stringent control.   I will plan to see this nice gentleman again in one year.    Sincerely,      Gerrit Friends. Dietrich Pates, MD, [facility]  Electronically Signed    RMR/MedQ  DD: 02/06/2006  DT: 02/06/2006  Job #: [id number]

## 2010-07-21 NOTE — Procedures (Signed)
   NAME:  Matthew Boyd, Matthew Boyd                        ACCOUNT NO.:  [id number]   MEDICAL RECORD NO.:  [id number]                   PATIENT TYPE:  OUT   LOCATION:  RESP                                 FACILITY:  [facility]   PHYSICIAN:  [place] Bing, M.D. [facility]           DATE OF BIRTH:  [birth year]   DATE OF PROCEDURE:  01/20/2002  DATE OF DISCHARGE:                                  ECHOCARDIOGRAM   CLINICAL DATA:  A 77 year old gentleman with cardiomyopathy by Cardiolite  imaging; history of hypertension.   1. Technically suboptimal but adequate echocardiographic study.  2. Left atrial size at the upper limit of normal to very mildly increased.     Normal right atrium and right ventricle.  3. Mild aortic sclerosis with mild annular calcification.  4. Slight mitral valve thickening with minimal annular calcification.  Mild     mitral regurgitation.  5. Normal tricuspid valve with physiologic regurgitation; estimated RV     systolic pressure at the upper limit of normal.  6. Normal pulmonic valve.  7. Normal internal dimension of the left ventricle; borderline LVH.  There     is a small segment at the base of the inferior wall that is hypokinetic     to akinetic.  Overall LV systolic function is normal.                                               Industry Bing, M.D. [facility]    RR/MEDQ  D:  01/20/2002  T:  01/21/2002  Job:  [id number]

## 2010-07-21 NOTE — Group Therapy Note (Signed)
NAME:  Matthew Boyd, Matthew Boyd              ACCOUNT NO.:  [id number]   MEDICAL RECORD NO.:  [id number]          PATIENT TYPE:  INP   LOCATION:  A211                          FACILITY:  [facility]   PHYSICIAN:  Angus G. Renard Matter, MD   DATE OF BIRTH:  August 12, 1933   DATE OF PROCEDURE:  DATE OF DISCHARGE:                                   PROGRESS NOTE   This patient was admitted with hyperglycemia, impending ketoacidosis,  urinary tract infection, pneumonitis and laryngitis.  He has improved.  His  sugars have improved and range from a high of 450-242 with most recent 284.  Patient feels much better.   OBJECTIVE:  VITAL SIGNS:  Blood pressure 108/57, respirations 20, pulse 70,  temp 97.4.  LUNGS:  Clear to P&A.  HEART:  Regular rhythm.  ABDOMEN:  No palpable organs or masses.   ASSESSMENT:  Patient was admitted with elevated blood sugars and pending  ketoacidosis, pneumonitis, possible right hilar mass or adenopathy.   PLAN:  To continue current regimen.  Will continue to advance Lantus insulin  dosage and will obtain CT of the chest whenever creatinine is lower.      AGM/MEDQ  D:  03/29/2004  T:  03/29/2004  Job:  [id number]

## 2010-12-08 ENCOUNTER — Encounter: Payer: Self-pay | Admitting: Cardiology

## 2011-02-06 ENCOUNTER — Encounter: Payer: Self-pay | Admitting: Cardiology

## 2011-02-08 ENCOUNTER — Encounter: Payer: Self-pay | Admitting: Cardiology

## 2011-02-08 ENCOUNTER — Ambulatory Visit (INDEPENDENT_AMBULATORY_CARE_PROVIDER_SITE_OTHER): Payer: Medicare Other | Admitting: Cardiology

## 2011-02-08 DIAGNOSIS — E785 Hyperlipidemia, unspecified: Secondary | ICD-10-CM

## 2011-02-08 DIAGNOSIS — I10 Essential (primary) hypertension: Secondary | ICD-10-CM | POA: Insufficient documentation

## 2011-02-08 DIAGNOSIS — E119 Type 2 diabetes mellitus without complications: Secondary | ICD-10-CM | POA: Insufficient documentation

## 2011-02-08 DIAGNOSIS — F17201 Nicotine dependence, unspecified, in remission: Secondary | ICD-10-CM | POA: Insufficient documentation

## 2011-02-08 DIAGNOSIS — N189 Chronic kidney disease, unspecified: Secondary | ICD-10-CM | POA: Insufficient documentation

## 2011-02-08 DIAGNOSIS — E782 Mixed hyperlipidemia: Secondary | ICD-10-CM

## 2011-02-08 DIAGNOSIS — I4891 Unspecified atrial fibrillation: Secondary | ICD-10-CM

## 2011-02-08 DIAGNOSIS — Z7901 Long term (current) use of anticoagulants: Secondary | ICD-10-CM

## 2011-02-08 DIAGNOSIS — I2510 Atherosclerotic heart disease of native coronary artery without angina pectoris: Secondary | ICD-10-CM | POA: Insufficient documentation

## 2011-02-08 DIAGNOSIS — Z794 Long term (current) use of insulin: Secondary | ICD-10-CM

## 2011-02-08 DIAGNOSIS — N200 Calculus of kidney: Secondary | ICD-10-CM | POA: Insufficient documentation

## 2011-02-08 DIAGNOSIS — I480 Paroxysmal atrial fibrillation: Secondary | ICD-10-CM

## 2011-02-08 NOTE — Assessment & Plan Note (Signed)
Patient does not now nor has he ever had symptoms directly related to ischemic heart disease.  We will continue to treat other cardiac issues actively and to control cardiovascular risk factors to the extent possible.

## 2011-02-08 NOTE — Patient Instructions (Signed)
Your physician recommends that you schedule a follow-up appointment in: 12 months  Your physician recommends that you return for lab work in: Within the week  Stool Cards x 3 and return to office asap

## 2011-02-08 NOTE — Progress Notes (Signed)
Patient ID: Matthew Boyd, male   DOB: Apr 17, 1933, 77 y.o.   MRN: [id number] HPI: Matthew Boyd returns to the office as scheduled for continuing assessment and treatment of coronary artery disease and paroxysmal atrial fibrillation.  I have followed this nice gentleman for nearly 20 years since he initial presented with cardiomyopathy and presumed underlying coronary artery disease.  Left ventricular systolic function subsequently recovered, but atrial fibrillation was diagnosed in 2003 prompting cardiac catheterization, discovery of a critical circumflex marginal lesion and subsequent percutaneous intervention with stenting.  He has done superbly since then, but was found to have recurrent atrial fibrillation last year at which time anticoagulation with warfarin was initiated.  He's had some bruising, but no bleeding or other complications.  Prior to Admission medications   Medication Sig Start Date End Date Taking? Authorizing Provider  amLODipine (NORVASC) 10 MG tablet Take 10 mg by mouth daily.     Yes Historical Provider, MD  chlorthalidone (HYGROTON) 25 MG tablet Take 25 mg by mouth daily.     Yes Historical Provider, MD  insulin glargine (LANTUS) 100 UNIT/ML injection Inject 32 Units into the skin daily.     Yes Historical Provider, MD  meloxicam (MOBIC) 15 MG tablet Take 15 mg by mouth daily.     Yes Historical Provider, MD  metoprolol (LOPRESSOR) 50 MG tablet Take 50 mg by mouth 2 (two) times daily.     Yes Historical Provider, MD  niacin (NIASPAN) 1000 MG CR tablet Take 1,000 mg by mouth daily.     Yes Historical Provider, MD  ramipril (ALTACE) 10 MG capsule Take 10 mg by mouth daily.     Yes Historical Provider, MD  rosuvastatin (CRESTOR) 20 MG tablet Take 20 mg by mouth daily.     Yes Historical Provider, MD  warfarin (COUMADIN) 6 MG tablet Take 6 mg by mouth daily. Managed by [employer]    Yes Historical Provider, MD    No Known Allergies    Past medical history, social history, and  family history reviewed and updated.  ROS: Denies orthopnea, PND, exertional dyspnea, palpitations, lightheadedness or syncope.  He has not required hospitalization nor care in the emergency department in recent years.  PHYSICAL EXAM: BP 131/70  Pulse 79  Ht 5\' 9"  (1.753 m)  Wt 102.059 kg (225 lb)  BMI 33.23 kg/m2  General-Well developed; no acute distress Body habitus-overweight Neck-No JVD; no carotid bruits Lungs-clear lung fields; resonant to percussion Cardiovascular-normal PMI; normal S1 and S2; irregular fairly rapid rhythm Abdomen-normal bowel sounds; soft and non-tender without masses or organomegaly Musculoskeletal-No deformities, no cyanosis or clubbing Neurologic-Normal cranial nerves; symmetric strength and tone Skin-Warm, no significant lesions Extremities-distal pulses intact; no edema  Rhythm Strip: Atrial fibrillation with controlled ventricular response.  Heart rate is 78 at rest and 120 after walking for a few minutes.  ASSESSMENT AND PLAN:  Marion Bing, MD 02/08/2011 2:55 PM

## 2011-02-08 NOTE — Assessment & Plan Note (Signed)
Blood pressure control is adequate with current medication, which will be continued. 

## 2011-02-08 NOTE — Assessment & Plan Note (Signed)
Repeat chemistry profile will be obtained to reassess renal function.

## 2011-02-08 NOTE — Assessment & Plan Note (Signed)
Chronic anticoagulation is clearly indicated and has been well tolerated since initiated one year ago.  A CBC and stool for Hemoccult testing will be obtained to exclude occult GI blood loss.

## 2011-02-08 NOTE — Assessment & Plan Note (Signed)
Heart rate at rest was 80 bpm, which increased to 120 after a few 100 feet of walking.  Her rate control is excellent with current medication, which will be continued.

## 2011-02-08 NOTE — Assessment & Plan Note (Signed)
Patient reports good CBGs at home.  Most recent A1c from 09/2010 was 6.9.

## 2011-02-08 NOTE — Assessment & Plan Note (Signed)
Excellent control of hyperlipidemia demonstrated within the past few months.  Current therapy will be continued.

## 2011-02-13 ENCOUNTER — Encounter (INDEPENDENT_AMBULATORY_CARE_PROVIDER_SITE_OTHER): Payer: Medicare Other

## 2011-02-13 DIAGNOSIS — Z7901 Long term (current) use of anticoagulants: Secondary | ICD-10-CM

## 2011-02-14 ENCOUNTER — Encounter: Payer: Self-pay | Admitting: *Deleted

## 2011-02-14 ENCOUNTER — Other Ambulatory Visit: Payer: Self-pay | Admitting: *Deleted

## 2011-02-14 DIAGNOSIS — I10 Essential (primary) hypertension: Secondary | ICD-10-CM

## 2011-02-14 LAB — COMPREHENSIVE METABOLIC PANEL
ALT: 18 U/L (ref 0–53)
Albumin: 4.9 g/dL (ref 3.5–5.2)
CO2: 26 mEq/L (ref 19–32)
Calcium: 9.5 mg/dL (ref 8.4–10.5)
Chloride: 104 mEq/L (ref 96–112)
Glucose, Bld: 73 mg/dL (ref 70–99)
Sodium: 140 mEq/L (ref 135–145)
Total Protein: 7.5 g/dL (ref 6.0–8.3)

## 2011-02-14 LAB — CBC
Hemoglobin: 15.6 g/dL (ref 13.0–17.0)
Platelets: 174 10*3/uL (ref 150–400)
RBC: 4.97 MIL/uL (ref 4.22–5.81)
WBC: 7.6 10*3/uL (ref 4.0–10.5)

## 2011-02-14 LAB — LIPID PANEL
Cholesterol: 145 mg/dL (ref 0–200)
Triglycerides: 121 mg/dL (ref <150)

## 2011-02-20 ENCOUNTER — Other Ambulatory Visit: Payer: Self-pay

## 2011-02-20 DIAGNOSIS — Z7901 Long term (current) use of anticoagulants: Secondary | ICD-10-CM

## 2011-03-13 ENCOUNTER — Other Ambulatory Visit: Payer: Self-pay | Admitting: Cardiology

## 2011-03-14 LAB — BASIC METABOLIC PANEL
BUN: 54 mg/dL — ABNORMAL HIGH (ref 6–23)
CO2: 22 mEq/L (ref 19–32)
Chloride: 101 mEq/L (ref 96–112)
Creat: 2.27 mg/dL — ABNORMAL HIGH (ref 0.50–1.35)
Glucose, Bld: 170 mg/dL — ABNORMAL HIGH (ref 70–99)
Potassium: 4.7 mEq/L (ref 3.5–5.3)

## 2011-03-20 ENCOUNTER — Other Ambulatory Visit: Payer: Self-pay | Admitting: *Deleted

## 2011-03-20 ENCOUNTER — Encounter: Payer: Self-pay | Admitting: Cardiology

## 2011-03-20 ENCOUNTER — Encounter: Payer: Self-pay | Admitting: *Deleted

## 2011-03-20 DIAGNOSIS — N289 Disorder of kidney and ureter, unspecified: Secondary | ICD-10-CM

## 2011-04-13 ENCOUNTER — Encounter: Payer: Self-pay | Admitting: Cardiology

## 2011-04-20 ENCOUNTER — Ambulatory Visit (INDEPENDENT_AMBULATORY_CARE_PROVIDER_SITE_OTHER): Payer: Medicare Other | Admitting: *Deleted

## 2011-04-20 ENCOUNTER — Encounter: Payer: Self-pay | Admitting: *Deleted

## 2011-04-20 VITALS — BP 129/79 | HR 70 | Ht 69.0 in | Wt 228.0 lb

## 2011-04-20 DIAGNOSIS — I10 Essential (primary) hypertension: Secondary | ICD-10-CM

## 2011-04-20 NOTE — Progress Notes (Signed)
Presents for blood pressure check.  Home BP optimal.  No complaints noted today.

## 2011-04-20 NOTE — Progress Notes (Signed)
Patient ID: Matthew Boyd, male   DOB: [birth year], 78 y.o.   MRN: [id number] Continue current Rx.

## 2011-04-30 ENCOUNTER — Encounter: Payer: Self-pay | Admitting: Cardiology

## 2011-05-02 ENCOUNTER — Other Ambulatory Visit: Payer: Self-pay | Admitting: Cardiology

## 2011-05-02 ENCOUNTER — Encounter: Payer: Self-pay | Admitting: Cardiology

## 2012-02-13 ENCOUNTER — Ambulatory Visit: Payer: Medicare Other | Admitting: Cardiology

## 2012-02-28 ENCOUNTER — Ambulatory Visit (INDEPENDENT_AMBULATORY_CARE_PROVIDER_SITE_OTHER): Payer: Medicare Other | Admitting: Cardiology

## 2012-02-28 ENCOUNTER — Encounter: Payer: Self-pay | Admitting: Cardiology

## 2012-02-28 VITALS — BP 135/78 | HR 79 | Ht 69.0 in | Wt 227.0 lb

## 2012-02-28 DIAGNOSIS — E785 Hyperlipidemia, unspecified: Secondary | ICD-10-CM

## 2012-02-28 DIAGNOSIS — I4891 Unspecified atrial fibrillation: Secondary | ICD-10-CM

## 2012-02-28 DIAGNOSIS — I2510 Atherosclerotic heart disease of native coronary artery without angina pectoris: Secondary | ICD-10-CM

## 2012-02-28 DIAGNOSIS — I10 Essential (primary) hypertension: Secondary | ICD-10-CM

## 2012-02-28 DIAGNOSIS — I480 Paroxysmal atrial fibrillation: Secondary | ICD-10-CM

## 2012-02-28 DIAGNOSIS — M1990 Unspecified osteoarthritis, unspecified site: Secondary | ICD-10-CM

## 2012-02-28 DIAGNOSIS — I7090 Unspecified atherosclerosis: Secondary | ICD-10-CM

## 2012-02-28 DIAGNOSIS — Z7901 Long term (current) use of anticoagulants: Secondary | ICD-10-CM

## 2012-02-28 NOTE — Assessment & Plan Note (Signed)
No abnormal systolic or diastolic pressures recorded during the past 2 years.  Current medication appears to be affording excellent hypertension control.

## 2012-02-28 NOTE — Assessment & Plan Note (Signed)
Excellent control of hyperlipidemia with current medical therapy, which will be continued.

## 2012-02-28 NOTE — Assessment & Plan Note (Signed)
No symptoms at present suggest myocardial ischemia.  We will continue to focus on optimal control of cardiovascular risk factors.

## 2012-02-28 NOTE — Patient Instructions (Addendum)
Your physician recommends that you schedule a follow-up appointment in: 1 year  Your physician has recommended you make the following change in your medication:  1 - STOP Mobic and use Tylenol as needed.  Call if more than 3 doses a day are needed.  Stools x 3 and return to the office

## 2012-02-28 NOTE — Assessment & Plan Note (Signed)
Patient has been stable while treated with warfarin for the past few years.  We will continue to monitor for occult GI blood loss-stool testing is pending.  Patient has never undergone colonoscopy.  I asked him to discuss the possibility of the screening study with Dr. Renard Matter at his next office visit.

## 2012-02-28 NOTE — Progress Notes (Signed)
Patient ID: Matthew Boyd, male   DOB: [birth year], 78 y.o.   MRN: [id number]  HPI: Scheduled return visit for this very nice gentleman with long-standing coronary disease, but a very stable course and no recent problems.  Over the past year he has not been hospitalized, been seen in the emergency department nor developed any new medical problems.  He remains active for his age without any cardiopulmonary symptoms.  He has tolerated warfarin without difficulty.  Dosage is adjusted by Dr. Renard Matter.  Prior to Admission medications   Medication Sig Start Date End Date Taking? Authorizing Provider  amLODipine (NORVASC) 10 MG tablet TAKE 1 TABLET BY MOUTH ONCE DAILY 05/02/11  Yes Kathlen Brunswick, MD  insulin glargine (LANTUS) 100 UNIT/ML injection Inject 32 Units into the skin daily.     Yes Historical Provider, MD  meloxicam (MOBIC) 15 MG tablet Take 15 mg by mouth daily.     Yes Historical Provider, MD  metoprolol (LOPRESSOR) 50 MG tablet Take 50 mg by mouth 2 (two) times daily.     Yes Historical Provider, MD  niacin (NIASPAN) 1000 MG CR tablet Take 1,000 mg by mouth daily.     Yes Historical Provider, MD  ramipril (ALTACE) 10 MG capsule Take 10 mg by mouth daily.     Yes Historical Provider, MD  rosuvastatin (CRESTOR) 20 MG tablet Take 20 mg by mouth daily.     Yes Historical Provider, MD  warfarin (COUMADIN) 6 MG tablet Take 6 mg by mouth daily. Managed by [employer]    Yes Historical Provider, MD  No Known Allergies    Past medical history, social history, and family history reviewed and updated.  ROS: Denies chest pain, orthopnea, PND, outpatient, lightheadedness or syncope.  Notes mild intermittent pedal edema.  Does not recall recent arthritic discomfort.  No history of GI bleeding, peptic ulcer disease or other significant GI pathology.  All other systems reviewed and are negative.  PHYSICAL EXAM: BP 135/78  Pulse 79  Ht 5\' 9"  (1.753 m)  Wt 102.967 kg (227 lb)  BMI 33.52 kg/m2 ;  stable weight General-Well developed; no acute distress; pleasant Body habitus-Moderately overweight Neck-No JVD; no carotid bruits Lungs-clear lung fields; resonant to percussion Cardiovascular-normal PMI; normal S1 and S2; irregular rhythm Abdomen-normal bowel sounds; soft and non-tender without masses or organomegaly Musculoskeletal-No deformities, no cyanosis or clubbing Neurologic-Normal cranial nerves; symmetric strength and tone Skin-Warm, no significant lesions Extremities-distal pulses intact; 1/2+ pretibial edema  EKG: Atrial fibrillation with controlled ventricular response; ventricular rate of 79 bpm; delayed R-wave progression.  ASSESSMENT AND PLAN:  Williams Bing, MD 02/28/2012 1:35 PM

## 2012-02-28 NOTE — Progress Notes (Deleted)
Name: Matthew Boyd    DOB: 1933-09-10  Age: 78 y.o.  MR#: [id number]       PCP:  Alice Reichert, MD      Insurance: @PAYORNAME @   CC:   No chief complaint on file.  MEDICATION BOTTLES NO RECENT LABS COUMADIN MANAGED BY PCP STOOL CARDS NEGATIVE 02/20/11  VS BP 135/78  Pulse 79  Ht 5\' 9"  (1.753 m)  Wt 227 lb (102.967 kg)  BMI 33.52 kg/m2  Weights Current Weight  02/28/12 227 lb (102.967 kg)  04/20/11 228 lb (103.42 kg)  02/08/11 225 lb (102.059 kg)    Blood Pressure  BP Readings from Last 3 Encounters:  02/28/12 135/78  04/20/11 129/79  02/08/11 131/70     Admit date:  (Not on file) Last encounter with RMR:  Visit date not found   Allergy No Known Allergies  Current Outpatient Prescriptions  Medication Sig Dispense Refill  . amLODipine (NORVASC) 10 MG tablet TAKE 1 TABLET BY MOUTH ONCE DAILY  90 tablet  3  . insulin glargine (LANTUS) 100 UNIT/ML injection Inject 32 Units into the skin daily.        . meloxicam (MOBIC) 15 MG tablet Take 15 mg by mouth daily.        . metoprolol (LOPRESSOR) 50 MG tablet Take 50 mg by mouth 2 (two) times daily.        . niacin (NIASPAN) 1000 MG CR tablet Take 1,000 mg by mouth daily.        . ramipril (ALTACE) 10 MG capsule Take 10 mg by mouth daily.        . rosuvastatin (CRESTOR) 20 MG tablet Take 20 mg by mouth daily.        Marland Kitchen warfarin (COUMADIN) 6 MG tablet Take 6 mg by mouth daily. Managed by [employer]         Discontinued Meds:   There are no discontinued medications.  Patient Active Problem List  Diagnosis  . Arteriosclerotic cardiovascular disease (ASCVD)  . Paroxysmal atrial fibrillation  . Hypertension  . Hyperlipidemia  . Diabetes mellitus type 2, insulin dependent  . Nephrolithiasis  . Chronic kidney disease  . Tobacco abuse, in remission  . Chronic anticoagulation    LABS No visits with results within 3 Month(s) from this visit. Latest known visit with results is:  Orders Only on 03/13/2011    Component Date Value  . Sodium 03/13/2011 134*  . Potassium 03/13/2011 4.7   . Chloride 03/13/2011 101   . CO2 03/13/2011 22   . Glucose, Bld 03/13/2011 170*  . BUN 03/13/2011 54*  . Creat 03/13/2011 2.27*  . Calcium 03/13/2011 9.9      Results for this Opt Visit:     Results for orders placed in visit on 03/13/11  BASIC METABOLIC PANEL      Component Value Range   Sodium 134 (*) 135 - 145 mEq/L   Potassium 4.7  3.5 - 5.3 mEq/L   Chloride 101  96 - 112 mEq/L   CO2 22  19 - 32 mEq/L   Glucose, Bld 170 (*) 70 - 99 mg/dL   BUN 54 (*) 6 - 23 mg/dL   Creat 7.84 (*) 6.96 - 1.35 mg/dL   Calcium 9.9  8.4 - 29.5 mg/dL    EKG Orders placed in visit on 02/28/12  . EKG 12-LEAD     Prior Assessment and Plan Problem List as of 02/28/2012  Cardiology Problems   Arteriosclerotic cardiovascular disease (ASCVD)   Last Assessment & Plan Note   02/08/2011 Office Visit Signed 02/08/2011  5:54 PM by Kathlen Brunswick, MD    Patient does not now nor has he ever had symptoms directly related to ischemic heart disease.  We will continue to treat other cardiac issues actively and to control cardiovascular risk factors to the extent possible.    Paroxysmal atrial fibrillation   Last Assessment & Plan Note   02/08/2011 Office Visit Signed 02/08/2011  5:59 PM by Kathlen Brunswick, MD    Heart rate at rest was 80 bpm, which increased to 120 after a few 100 feet of walking.  Her rate control is excellent with current medication, which will be continued.    Hypertension   Last Assessment & Plan Note   02/08/2011 Office Visit Signed 02/08/2011  5:57 PM by Kathlen Brunswick, MD    Blood pressure control is adequate with current medication, which will be continued.    Hyperlipidemia   Last Assessment & Plan Note   02/08/2011 Office Visit Signed 02/08/2011  5:56 PM by Kathlen Brunswick, MD    Excellent control of hyperlipidemia demonstrated within the past few months.  Current therapy will be  continued.      Other   Diabetes mellitus type 2, insulin dependent   Last Assessment & Plan Note   02/08/2011 Office Visit Signed 02/08/2011  5:56 PM by Kathlen Brunswick, MD    Patient reports good CBGs at home.  Most recent A1c from 09/2010 was 6.9.    Nephrolithiasis   Chronic kidney disease   Last Assessment & Plan Note   02/08/2011 Office Visit Signed 02/08/2011  5:55 PM by Kathlen Brunswick, MD    Repeat chemistry profile will be obtained to reassess renal function.    Tobacco abuse, in remission   Chronic anticoagulation   Last Assessment & Plan Note   02/08/2011 Office Visit Signed 02/08/2011  6:02 PM by Kathlen Brunswick, MD    Chronic anticoagulation is clearly indicated and has been well tolerated since initiated one year ago.  A CBC and stool for Hemoccult testing will be obtained to exclude occult GI blood loss.        Imaging: No results found.   FRS Calculation: Score not calculated. Missing: Total Cholesterol

## 2012-03-20 ENCOUNTER — Ambulatory Visit (INDEPENDENT_AMBULATORY_CARE_PROVIDER_SITE_OTHER): Payer: Medicare Other | Admitting: *Deleted

## 2012-03-20 ENCOUNTER — Other Ambulatory Visit (INDEPENDENT_AMBULATORY_CARE_PROVIDER_SITE_OTHER): Payer: Medicare Other | Admitting: *Deleted

## 2012-03-20 DIAGNOSIS — Z7901 Long term (current) use of anticoagulants: Secondary | ICD-10-CM

## 2012-03-20 DIAGNOSIS — R0989 Other specified symptoms and signs involving the circulatory and respiratory systems: Secondary | ICD-10-CM

## 2012-03-20 LAB — POC HEMOCCULT BLD/STL (HOME/3-CARD/SCREEN): Card #2 Fecal Occult Blod, POC: NEGATIVE

## 2013-03-24 ENCOUNTER — Ambulatory Visit (INDEPENDENT_AMBULATORY_CARE_PROVIDER_SITE_OTHER): Payer: Medicare Other | Admitting: Cardiology

## 2013-03-24 VITALS — BP 122/68 | HR 63 | Ht 69.0 in | Wt 217.0 lb

## 2013-03-24 DIAGNOSIS — I10 Essential (primary) hypertension: Secondary | ICD-10-CM

## 2013-03-24 DIAGNOSIS — I2510 Atherosclerotic heart disease of native coronary artery without angina pectoris: Secondary | ICD-10-CM

## 2013-03-24 DIAGNOSIS — I4891 Unspecified atrial fibrillation: Secondary | ICD-10-CM

## 2013-03-24 DIAGNOSIS — E785 Hyperlipidemia, unspecified: Secondary | ICD-10-CM

## 2013-03-24 NOTE — Patient Instructions (Addendum)
Your physician wants you to follow-up in: 6 MONTHS You will receive a reminder letter in the mail two months in advance. If you don't receive a letter, please call our office to schedule the follow-up appointment. 

## 2013-03-24 NOTE — Progress Notes (Signed)
Clinical Summary [name] is a 80 y.o.male former patient of Dr Dietrich Pates, this is our first visit together. He is seen for the following medical problems.  1. CAD - evidence of prior infarct from imaging (echo and MPI), never had an acute clinical event. - LVEF 60-65% by echo 11/2009 - no recent chest pain. Denies any SOB or DOE though lives sedentary lifestyle - compliant with medications. He is not on ASA b/c of coumadin.   2. Paroxysmal afib - denies any palpitations - INR is followed by Dr Renard Matter - on coumadin, discussed alternatives but he is happy on current therapy.   3. HTN - does not check at home - compliant with meds  4. Hyperlipidemia - compliant with statin - reports recent labs checked by Dr Renard Matter, we do not have a recent panel in our system  5. CKD - followed by Dr Renard Matter, compliant with ACE-I  Past Medical History  Diagnosis Date  . Arteriosclerotic cardiovascular disease (ASCVD)     prior MI by echocardiography; stress nuclear -inferior scar; no prior cath  . Paroxysmal atrial fibrillation  10/2009    onset in 10/2009; anticoagulation  . Hypertension   . Hyperlipidemia   . Diabetes mellitus type 2, insulin dependent   . Nephrolithiasis     surgical stone extractionx2  . Chronic kidney disease     Creatinine of 1.5 in 11/03 and 1.8 in 10/08; 1.75 in 09/2009  . 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 Benign prostatic hypertrophy     s/p transurethral resection of the prostate     No Known Allergies   Current Outpatient Prescriptions  Medication Sig Dispense Refill  . amLODipine (NORVASC) 10 MG tablet TAKE 1 TABLET BY MOUTH ONCE DAILY  90 tablet  3  . insulin glargine (LANTUS) 100 UNIT/ML injection Inject 32 Units into the skin daily.        . metoprolol (LOPRESSOR) 50 MG tablet Take 50 mg by mouth 2 (two) times daily.        . niacin (NIASPAN) 1000 MG CR tablet Take 1,000 mg by mouth daily.        .  ramipril (ALTACE) 10 MG capsule Take 10 mg by mouth daily.        . rosuvastatin (CRESTOR) 20 MG tablet Take 20 mg by mouth daily.        Marland Kitchen warfarin (COUMADIN) 6 MG tablet Take 6 mg by mouth daily. Managed by Angus Mccinis        No current facility-administered medications for this visit.     Past Surgical History  Procedure Laterality Date  . Transurethral resection of prostate       No Known Allergies    Family History  Problem Relation Age of Onset  . Heart disease Sister      Social History Mr. Winterhalter reports that he quit smoking about 25 years ago. His smoking use included Cigarettes. He has a 20 pack-year smoking history. He has never used smokeless tobacco. Mr. Decandia reports that he drinks alcohol.   Review of Systems CONSTITUTIONAL: No weight loss, fever, chills, weakness or fatigue.  HEENT: Eyes: No visual loss, blurred vision, double vision or yellow sclerae.No hearing loss, sneezing, congestion, runny nose or sore throat.  SKIN: No rash or itching.  CARDIOVASCULAR: per HPI RESPIRATORY: No shortness of breath, cough or sputum.  GASTROINTESTINAL: No anorexia, nausea, vomiting or diarrhea. No abdominal pain or  blood.  GENITOURINARY: No burning on urination, no polyuria NEUROLOGICAL: No headache, dizziness, syncope, paralysis, ataxia, numbness or tingling in the extremities. No change in bowel or bladder control.  MUSCULOSKELETAL: No muscle, back pain, joint pain or stiffness.  LYMPHATICS: No enlarged nodes. No history of splenectomy.  PSYCHIATRIC: No history of depression or anxiety.  ENDOCRINOLOGIC: No reports of sweating, cold or heat intolerance. No polyuria or polydipsia.  Marland Kitchen.   Physical Examination p 63 bp 122/68 Wt 217 BMI 32 Gen: resting comfortably, no acute distress HEENT: no scleral icterus, pupils equal round and reactive, no palptable cervical adenopathy,  CV: irreg, rate 60, no m/r/g, no carotid bruits Resp: Clear to auscultation  bilaterally GI: abdomen is soft, non-tender, non-distended, normal bowel sounds, no hepatosplenomegaly MSK: extremities are warm, no edema.  Skin: warm, no rash Neuro:  no focal deficits Psych: appropriate affect   Diagnostic Studies  05/2001 MPI THE LEFT VENTRICLE WAS MILDLY DILATED. ON TOMOGRAPHIC IMAGES RECONSTRUCTED IN  STANDARD PLANES, THERE WAS A MILD DEFECT INVOLVING THE INFERIOR AND INFEROLATERAL MYOCARDIUM THAT  DEMONSTRATED NO REVERSIBILITY WHEN COMPARED TO REST IMAGES. THE GATED 3-D RECONSTRUCTION REVEALED  GLOBAL HYPOKINESIS WITH AN ESTIMATED EJECTION FRACTION OF .38. ON THE GATED TOMOGRAMS, THERE WAS  DECREASED SYSTOLIC ACCENTUATION OF ACTIVITY IN THE INFERIOR WALL. IMPRESSION ABNORMAL STRESS CARDIOLITE STUDY WITH IMPAIRED EXERCISE CAPACITY, A  NEGATIVE STRESS EKG, MILD LEFT VENTRICULAR DILATATION WITH MILD TO MODERATE GLOBAL SYSTOLIC DYSFUNCTION;  SCINTIGRAPHIC ABNORMALITIES MAY BE ARTIFACTUAL DUE TO DIAPHRAGMATIC ATTENUATION. NO  MYOCARDIAL ISCHEMIA IDENTIFIED. OTHER FINDINGS AS NOTED.  11/2009 Echo LVEF 60-65%, cannot evaluate diastolic function.   03/24/13 Clinic EKG Afib rate 70  Assessment and Plan  1. CAD - presumed CAD based on evidence of infarct by imaging, no prior clinical event - denies any current symptoms - continue risk factor modfication  2. Afib - no current symptoms - he is on coumadin with no bleeding issues. Discussed newer oral anticoagulants however he is happy on coumadin and will continue  3. HTN - at goal, based on history of DM goal <130/80.  - continue currrent meds  4. Hyperlipidemia - will request most recent lipid panel from Dr Renard MatterMcInnis office    Follow up 6 months  Antoine PocheJonathan F. Shaleigh Laubscher, M.D., F.A.C.C.

## 2013-03-27 ENCOUNTER — Encounter: Payer: Self-pay | Admitting: Cardiology

## 2013-09-08 ENCOUNTER — Encounter: Payer: Self-pay | Admitting: Cardiology

## 2013-09-29 ENCOUNTER — Ambulatory Visit: Payer: Medicare Other | Admitting: Cardiology

## 2013-10-05 ENCOUNTER — Other Ambulatory Visit: Payer: Self-pay

## 2013-10-05 ENCOUNTER — Encounter: Payer: Self-pay | Admitting: Cardiology

## 2013-10-05 ENCOUNTER — Ambulatory Visit (INDEPENDENT_AMBULATORY_CARE_PROVIDER_SITE_OTHER): Payer: Medicare Other | Admitting: Cardiology

## 2013-10-05 ENCOUNTER — Telehealth: Payer: Self-pay | Admitting: Cardiology

## 2013-10-05 VITALS — BP 118/72 | HR 61 | Ht 69.0 in | Wt 208.0 lb

## 2013-10-05 DIAGNOSIS — I2510 Atherosclerotic heart disease of native coronary artery without angina pectoris: Secondary | ICD-10-CM

## 2013-10-05 DIAGNOSIS — I4891 Unspecified atrial fibrillation: Secondary | ICD-10-CM

## 2013-10-05 DIAGNOSIS — I10 Essential (primary) hypertension: Secondary | ICD-10-CM

## 2013-10-05 MED ORDER — APIXABAN 5 MG PO TABS: 5.0000 mg | ORAL_TABLET | Freq: Two times a day (BID) | ORAL | Status: DC

## 2013-10-05 MED ORDER — WARFARIN SODIUM 5 MG PO TABS: ORAL_TABLET | ORAL | Status: DC

## 2013-10-05 MED ORDER — WARFARIN SODIUM 4 MG PO TABS: ORAL_TABLET | ORAL | Status: DC

## 2013-10-05 NOTE — Patient Instructions (Addendum)
Your physician wants you to follow-up in: 6 months You will receive a reminder letter in the mail two months in advance. If you don't receive a letter, please call our office to schedule the follow-up appointment.    Your physician has recommended you make the following change in your medication:     STOP Coumadin  START Eliquis 5 mg twice a day TOMORROW    Thank you for choosing [facility] !

## 2013-10-05 NOTE — Telephone Encounter (Signed)
Pt has to pay $45/month for Eliquis,declined and wants to be put back on Coumadin.Called [facility] (pharmacist Shaun)to make them aware   INR done today by Vashti HeyLisa Reid Coumadin nurse was 2.7.I spoke with Paulette in Dr.McInnis office to let thn know that pt will not be starting Eliquis.She will call pt when next INR apt

## 2013-10-05 NOTE — Progress Notes (Signed)
Clinical Summary [name] is a 80 y.o.male seen today for follow up of the following medical problems.  1. CAD  - evidence of prior infarct from imaging (echo and MPI), never had an acute clinical event.  - LVEF 60-65% by echo 11/2009  - no recent chest pain. Denies any SOB or DOE though lives sedentary lifestyle  - compliant with medications. He is not on ASA b/c of coumadin.   2. Paroxysmal afib  - denies any palpitations  - INR is followed by Matthew Boyd  - on coumadin, describes some easy bruising at times but no bleeding.   3. HTN  - does not check at home  - compliant with meds   4. Hyperlipidemia  - compliant with statin  - reports recent labs checked by Matthew Boyd, we do not have a recent panel in our system    5. CKD  - followed by Matthew Boyd, compliant with ACE-I  Past Medical History  Diagnosis Date  . Arteriosclerotic cardiovascular disease (ASCVD)     prior MI by echocardiography; stress nuclear -inferior scar; no prior cath  . Paroxysmal atrial fibrillation  10/2009    onset in 10/2009; anticoagulation  . Hypertension   . Hyperlipidemia   . Diabetes mellitus type 2, insulin dependent   . Nephrolithiasis     surgical stone extractionx2  . Chronic kidney disease     Creatinine of 1.5 in 11/03 and 1.8 in 10/08; 1.75 in 09/2009  . 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 Benign prostatic hypertrophy     s/p transurethral resection of the prostate     No Known Allergies   Current Outpatient Prescriptions  Medication Sig Dispense Refill  . amLODipine (NORVASC) 10 MG tablet TAKE 1 TABLET BY MOUTH ONCE DAILY  90 tablet  3  . chlorthalidone (HYGROTON) 25 MG tablet Take 25 mg by mouth daily.      . insulin glargine (LANTUS) 100 UNIT/ML injection Inject 32 Units into the skin daily.        . metoprolol (LOPRESSOR) 50 MG tablet Take 50 mg by mouth 2 (two) times daily.        . niacin (NIASPAN) 1000 MG CR tablet Take 1,000 mg  by mouth daily.        . ramipril (ALTACE) 10 MG capsule Take 10 mg by mouth daily.        . rosuvastatin (CRESTOR) 20 MG tablet Take 20 mg by mouth daily.        Marland Kitchen. warfarin (COUMADIN) 6 MG tablet Take 6 mg by mouth daily. Managed by Matthew Boyd        No current facility-administered medications for this visit.     Past Surgical History  Procedure Laterality Date  . Transurethral resection of prostate       No Known Allergies    Family History  Problem Relation Age of Onset  . Heart disease Sister      Social History [name] reports that he quit smoking about 25 years ago. His smoking use included Cigarettes. He has a 20 pack-year smoking history. He has never used smokeless tobacco. [name] reports that he drinks alcohol.   Review of Systems CONSTITUTIONAL: No weight loss, fever, chills, weakness or fatigue.  HEENT: Eyes: No visual loss, blurred vision, double vision or yellow sclerae.No hearing loss, sneezing, congestion, runny nose or sore throat.  SKIN: No rash or itching.  CARDIOVASCULAR: per HPI RESPIRATORY: No shortness of breath, cough or sputum.  GASTROINTESTINAL: No anorexia, nausea, vomiting or diarrhea. No abdominal pain or blood.  GENITOURINARY: No burning on urination, no polyuria NEUROLOGICAL: No headache, dizziness, syncope, paralysis, ataxia, numbness or tingling in the extremities. No change in bowel or bladder control.  MUSCULOSKELETAL: No muscle, back pain, joint pain or stiffness.  LYMPHATICS: No enlarged nodes. No history of splenectomy.  PSYCHIATRIC: No history of depression or anxiety.  ENDOCRINOLOGIC: No reports of sweating, cold or heat intolerance. No polyuria or polydipsia.  Marland Kitchen   Physical Examination p 61 bp 118/72 Wt 208 lbs BMI 31 Gen: resting comfortably, no acute distress HEENT: no scleral icterus, pupils equal round and reactive, no palptable cervical adenopathy,  CV: irreg, no m/r/g, no JVD, no carotid bruits Resp: Clear  to auscultation bilaterally GI: abdomen is soft, non-tender, non-distended, normal bowel sounds, no hepatosplenomegaly MSK: extremities are warm, no edema.  Skin: warm, no rash Neuro:  no focal deficits Psych: appropriate affect   Diagnostic Studies 05/2001 MPI  THE LEFT VENTRICLE WAS MILDLY DILATED. ON TOMOGRAPHIC IMAGES RECONSTRUCTED IN  STANDARD PLANES, THERE WAS A MILD DEFECT INVOLVING THE INFERIOR AND INFEROLATERAL MYOCARDIUM THAT  DEMONSTRATED NO REVERSIBILITY WHEN COMPARED TO REST IMAGES. THE GATED 3-D RECONSTRUCTION REVEALED  GLOBAL HYPOKINESIS WITH AN ESTIMATED EJECTION FRACTION OF .38. ON THE GATED TOMOGRAMS, THERE WAS  DECREASED SYSTOLIC ACCENTUATION OF ACTIVITY IN THE INFERIOR WALL. IMPRESSION ABNORMAL STRESS CARDIOLITE STUDY WITH IMPAIRED EXERCISE CAPACITY, A  NEGATIVE STRESS EKG, MILD LEFT VENTRICULAR DILATATION WITH MILD TO MODERATE GLOBAL SYSTOLIC DYSFUNCTION;  SCINTIGRAPHIC ABNORMALITIES MAY BE ARTIFACTUAL DUE TO DIAPHRAGMATIC ATTENUATION. NO  MYOCARDIAL ISCHEMIA IDENTIFIED. OTHER FINDINGS AS NOTED.   11/2009 Echo  LVEF 60-65%, cannot evaluate diastolic function.   03/24/13 Clinic EKG  Afib rate 70     Assessment and Plan  1. CAD  - presumed CAD based on evidence of infarct by imaging, no prior clinical event  - denies any current symptoms  - continue risk factor modfication   2. Afib  - no current symptoms  - he is on coumadin with no bleeding issues. He is interested in changing to NOAC, will change to eliquis 5mg  bid. We will check INR here in office and decide how long to be off coumadin prior to starting.    3. HTN  - at goal, based on history of DM goal <130/80.  - continue currrent meds   4. Hyperlipidemia  - this has been followed by his pcp with no recent panels in our system - defer to pcp   F/u 6 months    Matthew Boyd, M.D., F.A.C.C.

## 2013-10-05 NOTE — Telephone Encounter (Signed)
Questions regarding medication Dr.Branch wanted him to start on. / tgs

## 2014-04-08 ENCOUNTER — Encounter: Payer: Self-pay | Admitting: Cardiology

## 2014-04-08 ENCOUNTER — Ambulatory Visit (INDEPENDENT_AMBULATORY_CARE_PROVIDER_SITE_OTHER): Payer: Medicare Other | Admitting: Cardiology

## 2014-04-08 VITALS — BP 130/72 | HR 58 | Ht 69.0 in | Wt 209.0 lb

## 2014-04-08 DIAGNOSIS — I10 Essential (primary) hypertension: Secondary | ICD-10-CM

## 2014-04-08 DIAGNOSIS — I2510 Atherosclerotic heart disease of native coronary artery without angina pectoris: Secondary | ICD-10-CM

## 2014-04-08 DIAGNOSIS — I480 Paroxysmal atrial fibrillation: Secondary | ICD-10-CM

## 2014-04-08 DIAGNOSIS — E785 Hyperlipidemia, unspecified: Secondary | ICD-10-CM

## 2014-04-08 NOTE — Patient Instructions (Signed)
Your physician wants you to follow-up in: 6 months with Dr.Branch You will receive a reminder letter in the mail two months in advance. If you don't receive a letter, please call our office to schedule the follow-up appointment.   Your physician recommends that you continue on your current medications as directed. Please refer to the Current Medication list given to you today.    Thank you for choosing [facility] !        

## 2014-04-08 NOTE — Progress Notes (Signed)
Clinical Summary [name] is a 81 y.o.male seen today for follow up of the following medical problems.   1. CAD  - evidence of prior infarct from imaging (echo and MPI), never had an acute clinical event.  - LVEF 60-65% by echo 11/2009   - no recent chest pain. - compliant with medications. He is not on ASA b/c of coumadin.   2. Paroxysmal afib  - denies any palpitations  - INR is followed by Dr Renard MatterMcInnis. Eliquis too expensive for him, back on coumadin. Denies any bleeding issues  3. HTN  - does not check at home  - compliant with meds   4. Hyperlipidemia  - compliant with statin  - reports recent labs checked by Dr Renard MatterMcInnis  5. CKD  - followed by Dr Renard MatterMcInnis, compliant with ACE-I Past Medical History  Diagnosis Date  . Arteriosclerotic cardiovascular disease (ASCVD)     prior MI by echocardiography; stress nuclear -inferior scar; no prior cath  . Paroxysmal atrial fibrillation  10/2009    onset in 10/2009; anticoagulation  . Hypertension   . Hyperlipidemia   . Diabetes mellitus type 2, insulin dependent   . Nephrolithiasis     surgical stone extractionx2  . Chronic kidney disease     Creatinine of 1.5 in 11/03 and 1.8 in 10/08; 1.75 in 09/2009  . 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 Benign prostatic hypertrophy     s/p transurethral resection of the prostate     No Known Allergies   Current Outpatient Prescriptions  Medication Sig Dispense Refill  . acetaminophen (TYLENOL) 500 MG tablet Take 500 mg by mouth every 6 (six) hours as needed.    Marland Kitchen. amLODipine (NORVASC) 10 MG tablet TAKE 1 TABLET BY MOUTH ONCE DAILY 90 tablet 3  . chlorthalidone (HYGROTON) 25 MG tablet Take 25 mg by mouth daily.    Marland Kitchen. docusate sodium (COLACE) 50 MG capsule Take 50 mg by mouth as needed for mild constipation.    . insulin glargine (LANTUS) 100 UNIT/ML injection Inject 32 Units into the skin daily.      . metoprolol (LOPRESSOR) 50 MG tablet  Take 50 mg by mouth 2 (two) times daily.      . niacin (NIASPAN) 1000 MG CR tablet Take 500 mg by mouth daily.     . ramipril (ALTACE) 10 MG capsule Take 10 mg by mouth daily.      . rosuvastatin (CRESTOR) 20 MG tablet Take 20 mg by mouth daily.      Marland Kitchen. warfarin (COUMADIN) 4 MG tablet Take as directed by dr Renard Mattermcinnis 30 tablet 3  . warfarin (COUMADIN) 5 MG tablet Take as directed by dr Renard Mattermcinnis 30 tablet 6   No current facility-administered medications for this visit.     Past Surgical History  Procedure Laterality Date  . Transurethral resection of prostate       No Known Allergies    Family History  Problem Relation Age of Onset  . Heart disease Sister      Social History [name] reports that he quit smoking about 26 years ago. His smoking use included Cigarettes. He has a 20 pack-year smoking history. He has never used smokeless tobacco. [name] reports that he drinks alcohol.   Review of Systems CONSTITUTIONAL: No weight loss, fever, chills, weakness or fatigue.  HEENT: Eyes: No visual loss, blurred vision, double vision or yellow sclerae.No hearing loss, sneezing, congestion,  runny nose or sore throat.  SKIN: No rash or itching.  CARDIOVASCULAR: per HPI RESPIRATORY: No shortness of breath, cough or sputum.  GASTROINTESTINAL: No anorexia, nausea, vomiting or diarrhea. No abdominal pain or blood.  GENITOURINARY: No burning on urination, no polyuria NEUROLOGICAL: No headache, dizziness, syncope, paralysis, ataxia, numbness or tingling in the extremities. No change in bowel or bladder control.  MUSCULOSKELETAL: No muscle, back pain, joint pain or stiffness.  LYMPHATICS: No enlarged nodes. No history of splenectomy.  PSYCHIATRIC: No history of depression or anxiety.  ENDOCRINOLOGIC: No reports of sweating, cold or heat intolerance. No polyuria or polydipsia.  Marland Kitchen   Physical Examination p 58 bp 130/72 Wt 209 lbs BMI 31 Gen: resting comfortably, no acute  distress HEENT: no scleral icterus, pupils equal round and reactive, no palptable cervical adenopathy,  CV: irreg, rate 60, no m/r/g, no jvd Resp: Clear to auscultation bilaterally GI: abdomen is soft, non-tender, non-distended, normal bowel sounds, no hepatosplenomegaly MSK: extremities are warm, no edema.  Skin: warm, no rash Neuro:  no focal deficits Psych: appropriate affect   Diagnostic Studies 05/2001 MPI  THE LEFT VENTRICLE WAS MILDLY DILATED. ON TOMOGRAPHIC IMAGES RECONSTRUCTED IN  STANDARD PLANES, THERE WAS A MILD DEFECT INVOLVING THE INFERIOR AND INFEROLATERAL MYOCARDIUM THAT  DEMONSTRATED NO REVERSIBILITY WHEN COMPARED TO REST IMAGES. THE GATED 3-D RECONSTRUCTION REVEALED  GLOBAL HYPOKINESIS WITH AN ESTIMATED EJECTION FRACTION OF .38. ON THE GATED TOMOGRAMS, THERE WAS  DECREASED SYSTOLIC ACCENTUATION OF ACTIVITY IN THE INFERIOR WALL. IMPRESSION ABNORMAL STRESS CARDIOLITE STUDY WITH IMPAIRED EXERCISE CAPACITY, A  NEGATIVE STRESS EKG, MILD LEFT VENTRICULAR DILATATION WITH MILD TO MODERATE GLOBAL SYSTOLIC DYSFUNCTION;  SCINTIGRAPHIC ABNORMALITIES MAY BE ARTIFACTUAL DUE TO DIAPHRAGMATIC ATTENUATION. NO  MYOCARDIAL ISCHEMIA IDENTIFIED. OTHER FINDINGS AS NOTED.   11/2009 Echo  LVEF 60-65%, cannot evaluate diastolic function.     Assessment and Plan  1. CAD  - presumed CAD based on evidence of infarct by imaging, no prior clinical event  - denies any current symptoms  - continue risk factor modfication   2. Afib  - no current symptoms  - continue current meds  3. HTN  - at goal, continue current meds   4. Hyperlipidemia  - this has been followed by his pcp with no recent panels in our system - defer to pcp     F/u 6 months   Antoine Poche, M.D.

## 2014-04-14 ENCOUNTER — Encounter: Payer: Self-pay | Admitting: Cardiology

## 2015-02-08 ENCOUNTER — Encounter (HOSPITAL_COMMUNITY): Payer: Self-pay | Admitting: *Deleted

## 2015-02-08 ENCOUNTER — Emergency Department (HOSPITAL_COMMUNITY)
Admission: EM | Admit: 2015-02-08 | Discharge: 2015-02-08 | Disposition: A | Discharge status: Home / Self Care | Payer: Medicare Other | Attending: Emergency Medicine | Admitting: Emergency Medicine

## 2015-02-08 DIAGNOSIS — N390 Urinary tract infection, site not specified: Secondary | ICD-10-CM

## 2015-02-08 DIAGNOSIS — Z7901 Long term (current) use of anticoagulants: Secondary | ICD-10-CM | POA: Insufficient documentation

## 2015-02-08 DIAGNOSIS — I129 Hypertensive chronic kidney disease with stage 1 through stage 4 chronic kidney disease, or unspecified chronic kidney disease: Secondary | ICD-10-CM | POA: Diagnosis not present

## 2015-02-08 DIAGNOSIS — E785 Hyperlipidemia, unspecified: Secondary | ICD-10-CM | POA: Diagnosis not present

## 2015-02-08 DIAGNOSIS — E119 Type 2 diabetes mellitus without complications: Secondary | ICD-10-CM | POA: Diagnosis not present

## 2015-02-08 DIAGNOSIS — Z79899 Other long term (current) drug therapy: Secondary | ICD-10-CM | POA: Insufficient documentation

## 2015-02-08 DIAGNOSIS — N189 Chronic kidney disease, unspecified: Secondary | ICD-10-CM | POA: Insufficient documentation

## 2015-02-08 DIAGNOSIS — I2510 Atherosclerotic heart disease of native coronary artery without angina pectoris: Secondary | ICD-10-CM | POA: Diagnosis not present

## 2015-02-08 DIAGNOSIS — Z87442 Personal history of urinary calculi: Secondary | ICD-10-CM | POA: Insufficient documentation

## 2015-02-08 DIAGNOSIS — Z87438 Personal history of other diseases of male genital organs: Secondary | ICD-10-CM | POA: Insufficient documentation

## 2015-02-08 DIAGNOSIS — Z794 Long term (current) use of insulin: Secondary | ICD-10-CM | POA: Insufficient documentation

## 2015-02-08 DIAGNOSIS — Z87891 Personal history of nicotine dependence: Secondary | ICD-10-CM | POA: Insufficient documentation

## 2015-02-08 DIAGNOSIS — R319 Hematuria, unspecified: Secondary | ICD-10-CM | POA: Insufficient documentation

## 2015-02-08 DIAGNOSIS — I480 Paroxysmal atrial fibrillation: Secondary | ICD-10-CM | POA: Diagnosis not present

## 2015-02-08 LAB — BASIC METABOLIC PANEL
Anion gap: 7 (ref 5–15)
BUN: 37 mg/dL — AB (ref 6–20)
CHLORIDE: 107 mmol/L (ref 101–111)
CO2: 25 mmol/L (ref 22–32)
Calcium: 9.3 mg/dL (ref 8.9–10.3)
Creatinine, Ser: 2.02 mg/dL — ABNORMAL HIGH (ref 0.61–1.24)
GFR calc Af Amer: 34 mL/min — ABNORMAL LOW (ref >60)
GFR calc non Af Amer: 30 mL/min — ABNORMAL LOW (ref >60)
GLUCOSE: 175 mg/dL — AB (ref 65–99)
Potassium: 4.2 mmol/L (ref 3.5–5.1)
Sodium: 139 mmol/L (ref 135–145)

## 2015-02-08 LAB — URINALYSIS, ROUTINE W REFLEX MICROSCOPIC
BILIRUBIN URINE: NEGATIVE
Glucose, UA: >1000 mg/dL — AB
Ketones, ur: 15 mg/dL — AB
NITRITE: POSITIVE — AB
Protein, ur: >300 mg/dL — AB
SPECIFIC GRAVITY, URINE: 1.015 (ref 1.005–1.030)
pH: 7 (ref 5.0–8.0)

## 2015-02-08 LAB — CBC WITH DIFFERENTIAL/PLATELET
Basophils Absolute: 0 10*3/uL (ref 0.0–0.1)
Basophils Relative: 0 %
EOS ABS: 0.2 10*3/uL (ref 0.0–0.7)
Eosinophils Relative: 2 %
HEMATOCRIT: 48.1 % (ref 39.0–52.0)
HEMOGLOBIN: 16.5 g/dL (ref 13.0–17.0)
LYMPHS ABS: 1.2 10*3/uL (ref 0.7–4.0)
Lymphocytes Relative: 12 %
MCH: 33.5 pg (ref 26.0–34.0)
MCHC: 34.3 g/dL (ref 30.0–36.0)
MCV: 97.6 fL (ref 78.0–100.0)
MONOS PCT: 8 %
Monocytes Absolute: 0.8 10*3/uL (ref 0.1–1.0)
NEUTROS PCT: 78 %
Neutro Abs: 7.5 10*3/uL (ref 1.7–7.7)
Platelets: 178 10*3/uL (ref 150–400)
RBC: 4.93 MIL/uL (ref 4.22–5.81)
RDW: 14 % (ref 11.5–15.5)
WBC: 9.7 10*3/uL (ref 4.0–10.5)

## 2015-02-08 LAB — URINE MICROSCOPIC-ADD ON: Bacteria, UA: NONE SEEN

## 2015-02-08 LAB — PROTIME-INR
INR: 4.81 — ABNORMAL HIGH (ref 0.00–1.49)
PROTHROMBIN TIME: 43.7 s — AB (ref 11.6–15.2)

## 2015-02-08 MED ORDER — DEXTROSE 5 % IV SOLN
1.0000 g | Freq: Once | INTRAVENOUS | Status: AC
  Administered 2015-02-08: 1 g via INTRAVENOUS

## 2015-02-08 MED ORDER — SODIUM CHLORIDE 0.9 % IV BOLUS (SEPSIS)
500.0000 mL | Freq: Once | INTRAVENOUS | Status: AC
  Administered 2015-02-08: 500 mL via INTRAVENOUS

## 2015-02-08 MED ORDER — CEPHALEXIN 500 MG PO CAPS: 500.0000 mg | ORAL_CAPSULE | Freq: Four times a day (QID) | ORAL | Status: DC

## 2015-02-08 MED ORDER — DEXTROSE 5 % IV SOLN
1.0000 g | Freq: Once | INTRAVENOUS | Status: DC
  Filled 2015-02-08: qty 10

## 2015-02-08 MED ORDER — CIPROFLOXACIN HCL 500 MG PO TABS: 500.0000 mg | ORAL_TABLET | Freq: Two times a day (BID) | ORAL | Status: DC

## 2015-02-08 NOTE — ED Provider Notes (Signed)
CSN: [id number]     Arrival date & time 02/08/15  1840 History   First MD Initiated Contact with Patient 02/08/15 1938     No chief complaint on file.    (Consider location/radiation/quality/duration/timing/severity/associated sxs/prior Treatment) HPI.... Patient presents with hematuria since earlier today with associated frequency, dysuria. No flank pain or fever. He is on Coumadin and his INR has been slightly elevated lately. He has been eating well and is ambulatory. Severity symptoms as mild to moderate. He does not appear toxic.  Past Medical History  Diagnosis Date  . Arteriosclerotic cardiovascular disease (ASCVD)     prior MI by echocardiography; stress nuclear -inferior scar; no prior cath  . Paroxysmal atrial fibrillation (HCC)  10/2009    onset in 10/2009; anticoagulation  . Hypertension   . Hyperlipidemia   . Diabetes mellitus type 2, insulin dependent (HCC)   . Nephrolithiasis     surgical stone extractionx2  . Chronic kidney disease     Creatinine of 1.5 in 11/03 and 1.8 in 10/08; 1.75 in 09/2009  . 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 Benign prostatic hypertrophy     s/p transurethral resection of the prostate   Past Surgical History  Procedure Laterality Date  . Transurethral resection of prostate     Family History  Problem Relation Age of Onset  . Heart disease Sister    Social History  Substance Use Topics  . Smoking status: Former Smoker -- 1.00 packs/day for 20 years    Types: Cigarettes    Start date: 08/29/1955    Quit date: 03/05/1988  . Smokeless tobacco: Never Used  . Alcohol Use: 0.0 oz/week    0 Standard drinks or equivalent per week     Comment: Excessive use in the 1990s 04/08/14- no longer drinks     Review of Systems  All other systems reviewed and are negative.     Allergies  Review of patient's allergies indicates no known allergies.  Home Medications   Prior to Admission medications    Medication Sig Start Date End Date Taking? Authorizing Provider  acetaminophen (TYLENOL) 500 MG tablet Take 1,000 mg by mouth every 6 (six) hours as needed for mild pain or moderate pain.    Yes Historical Provider, MD  amLODipine (NORVASC) 5 MG tablet Take 5 mg by mouth daily.  01/25/15  Yes Historical Provider, MD  chlorthalidone (HYGROTON) 25 MG tablet Take 25 mg by mouth daily.   Yes Historical Provider, MD  docusate sodium (COLACE) 100 MG capsule Take 200 mg by mouth daily.   Yes Historical Provider, MD  insulin glargine (LANTUS) 100 UNIT/ML injection Inject 20 Units into the skin every morning.    Yes Historical Provider, MD  metoprolol (LOPRESSOR) 50 MG tablet Take 50 mg by mouth 2 (two) times daily.     Yes Historical Provider, MD  niacin 500 MG tablet Take 500 mg by mouth daily.   Yes Historical Provider, MD  ramipril (ALTACE) 10 MG capsule Take 10 mg by mouth daily.     Yes Historical Provider, MD  simvastatin (ZOCOR) 20 MG tablet Take 20 mg by mouth daily. 01/25/15  Yes Historical Provider, MD  warfarin (COUMADIN) 4 MG tablet Take as directed by dr Renard Matter Patient taking differently: Take 4 mg by mouth daily. Take as directed by dr Renard Matter 10/05/13  Yes Butch Penny, MD  cephALEXin (KEFLEX) 500 MG capsule Take 1 capsule (500 mg total) by mouth  4 (four) times daily. 02/08/15   Donnetta HutchingBrian Akshay Spang, MD  warfarin (COUMADIN) 5 MG tablet Take as directed by dr Renard Mattermcinnis Patient not taking: Reported on 02/08/2015 10/05/13   Butch PennyAngus McInnis, MD   BP 98/67 mmHg  Pulse 88  Temp(Src) 97.6 F (36.4 C) (Oral)  Resp 14  Ht 5\' 9"  (1.753 m)  Wt 210 lb (95.255 kg)  BMI 31.00 kg/m2  SpO2 99% Physical Exam  Constitutional: He is oriented to person, place, and time. He appears well-developed and well-nourished.  HENT:  Head: Normocephalic and atraumatic.  Eyes: Conjunctivae and EOM are normal. Pupils are equal, round, and reactive to light.  Neck: Normal range of motion. Neck supple.  Cardiovascular: Normal  rate and regular rhythm.   Pulmonary/Chest: Effort normal and breath sounds normal.  Abdominal: Soft. Bowel sounds are normal.  No suprapubic tenderness  Genitourinary:  No flank tenderness  Musculoskeletal: Normal range of motion.  Neurological: He is alert and oriented to person, place, and time.  Skin: Skin is warm and dry.  Psychiatric: He has a normal mood and affect. His behavior is normal.  Nursing note and vitals reviewed.   ED Course  Procedures (including critical care time) Labs Review Labs Reviewed  URINALYSIS, ROUTINE W REFLEX MICROSCOPIC (NOT [facility]) - Abnormal; Notable for the following:    Color, Urine RED (*)    APPearance CLOUDY (*)    Glucose, UA >1000 (*)    Hgb urine dipstick LARGE (*)    Ketones, ur 15 (*)    Protein, ur >300 (*)    Nitrite POSITIVE (*)    Leukocytes, UA MODERATE (*)    All other components within normal limits  BASIC METABOLIC PANEL - Abnormal; Notable for the following:    Glucose, Bld 175 (*)    BUN 37 (*)    Creatinine, Ser 2.02 (*)    GFR calc non Af Amer 30 (*)    GFR calc Af Amer 34 (*)    All other components within normal limits  URINE MICROSCOPIC-ADD ON - Abnormal; Notable for the following:    Squamous Epithelial / LPF 0-5 (*)    All other components within normal limits  PROTIME-INR - Abnormal; Notable for the following:    Prothrombin Time 43.7 (*)    INR 4.81 (*)    All other components within normal limits  URINE CULTURE  CBC WITH DIFFERENTIAL/PLATELET    Imaging Review No results found. I have personally reviewed and evaluated these images and lab results as part of my medical decision-making.   EKG Interpretation None      MDM   Final diagnoses:  UTI (lower urinary tract infection)  Hematuria    Patient is nontoxic-appearing. He appears to have a urinary tract infection. INR is 4.81. Will hold Coumadin for now and start cephalexin for his infection. He has primary care follow-up.    Donnetta HutchingBrian Natassja Ollis,  MD 02/10/15 [phone]

## 2015-02-08 NOTE — Discharge Instructions (Signed)
Start antibiotic tomorrow. Hold Coumadin tomorrow. Follow-up your primary care doctor.

## 2015-02-08 NOTE — ED Notes (Signed)
Pt states he began having blood in his urine starting today. Pt has had trouble trying to urinate with painful urination. Pt denies any pain. He is currently taking coumadin.

## 2015-02-11 LAB — URINE CULTURE: Culture: 80000

## 2015-02-12 ENCOUNTER — Telehealth (HOSPITAL_BASED_OUTPATIENT_CLINIC_OR_DEPARTMENT_OTHER): Payer: Self-pay | Admitting: Emergency Medicine

## 2015-02-12 NOTE — Telephone Encounter (Signed)
Post ED Visit - Positive Culture Follow-up  Culture report reviewed by antimicrobial stewardship pharmacist:  []  Matthew Boyd, Pharm.D. []  Matthew Boyd, Pharm.D., BCPS []  Matthew Boyd, Pharm.D. []  Matthew Boyd, Pharm.D., BCPS []  Matthew Boyd, [address] BCPS, AAHIVP []  Matthew Boyd, Pharm.D., BCPS, AAHIVP [x]  Matthew Boyd, Pharm.D. []  Matthew Boyd, [address]  Positive urine culture Treated with cirpofloxacin and cephalexin, organism sensitive to the same and no further patient follow-up is required at this time.  Matthew Boyd, Matthew Boyd 02/12/2015, 9:04 AM

## 2015-03-10 DIAGNOSIS — E119 Type 2 diabetes mellitus without complications: Secondary | ICD-10-CM | POA: Diagnosis not present

## 2015-03-10 DIAGNOSIS — I10 Essential (primary) hypertension: Secondary | ICD-10-CM | POA: Diagnosis not present

## 2015-03-10 DIAGNOSIS — I480 Paroxysmal atrial fibrillation: Secondary | ICD-10-CM | POA: Diagnosis not present

## 2015-03-10 DIAGNOSIS — I482 Chronic atrial fibrillation: Secondary | ICD-10-CM | POA: Diagnosis not present

## 2015-04-01 DIAGNOSIS — I480 Paroxysmal atrial fibrillation: Secondary | ICD-10-CM | POA: Diagnosis not present

## 2015-04-21 DIAGNOSIS — I480 Paroxysmal atrial fibrillation: Secondary | ICD-10-CM | POA: Diagnosis not present

## 2015-04-22 DIAGNOSIS — E119 Type 2 diabetes mellitus without complications: Secondary | ICD-10-CM | POA: Diagnosis not present

## 2015-04-22 DIAGNOSIS — I10 Essential (primary) hypertension: Secondary | ICD-10-CM | POA: Diagnosis not present

## 2015-04-22 DIAGNOSIS — I480 Paroxysmal atrial fibrillation: Secondary | ICD-10-CM | POA: Diagnosis not present

## 2015-05-05 ENCOUNTER — Ambulatory Visit (INDEPENDENT_AMBULATORY_CARE_PROVIDER_SITE_OTHER): Payer: Medicare Other | Admitting: Cardiology

## 2015-05-05 ENCOUNTER — Encounter: Payer: Self-pay | Admitting: Cardiology

## 2015-05-05 VITALS — BP 118/60 | HR 75 | Ht 69.0 in | Wt 199.0 lb

## 2015-05-05 DIAGNOSIS — I2510 Atherosclerotic heart disease of native coronary artery without angina pectoris: Secondary | ICD-10-CM

## 2015-05-05 DIAGNOSIS — I10 Essential (primary) hypertension: Secondary | ICD-10-CM | POA: Diagnosis not present

## 2015-05-05 DIAGNOSIS — I480 Paroxysmal atrial fibrillation: Secondary | ICD-10-CM | POA: Diagnosis not present

## 2015-05-05 DIAGNOSIS — E785 Hyperlipidemia, unspecified: Secondary | ICD-10-CM | POA: Diagnosis not present

## 2015-05-05 NOTE — Patient Instructions (Signed)
Medication Instructions:  Start Flonase nasal spray AS NEEDED daily    Labwork: I will request lab work from you PCP  Testing/Procedures: none  Follow-Up: Your physician wants you to follow-up in: 6 months .  You will receive a reminder letter in the mail two months in advance. If you don't receive a letter, please call our office to schedule the follow-up appointment.   Any Other Special Instructions Will Be Listed Below (If Applicable). Thanks for choosing [facility]!!!       If you need a refill on your cardiac medications before your next appointment, please call your pharmacy.

## 2015-05-05 NOTE — Progress Notes (Signed)
Patient ID: Matthew Boyd, male   DOB: 04/11/1933, 82 y.o.   MRN: [id number]     Clinical Summary Matthew Boyd is a 82 y.o.male seen today for follow up of the following medical problems.   1. CAD  - evidence of prior infarct from imaging (echo and MPI), never had an acute clinical event.  - LVEF 60-65% by echo 11/2009    - denies any chest pain. Denies any SOB or DOE - compliant with meds  2. Paroxysmal afib  - denies any recent palpitations - had some hematuria in 02/2015, found to have UTI and INR of 4.8. Has since resolved.     3. HTN  - does not check at home  - compliant with meds   4. Hyperlipidemia  - compliant with statin  - reports recent labs checked by pcp  5. CKD  - followed by pcp, compliant with ACE-I Past Medical History  Diagnosis Date  . Arteriosclerotic cardiovascular disease (ASCVD)     prior MI by echocardiography; stress nuclear -inferior scar; no prior cath  . Paroxysmal atrial fibrillation (HCC)  10/2009    onset in 10/2009; anticoagulation  . Hypertension   . Hyperlipidemia   . Diabetes mellitus type 2, insulin dependent (HCC)   . Nephrolithiasis     surgical stone extractionx2  . Chronic kidney disease     Creatinine of 1.5 in 11/03 and 1.8 in 10/08; 1.75 in 09/2009  . 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 Benign prostatic hypertrophy     s/p transurethral resection of the prostate     No Known Allergies   Current Outpatient Prescriptions  Medication Sig Dispense Refill  . acetaminophen (TYLENOL) 500 MG tablet Take 1,000 mg by mouth every 6 (six) hours as needed for mild pain or moderate pain.     Marland Kitchen amLODipine (NORVASC) 5 MG tablet Take 5 mg by mouth daily.     . cephALEXin (KEFLEX) 500 MG capsule Take 1 capsule (500 mg total) by mouth 4 (four) times daily. 28 capsule 0  . chlorthalidone (HYGROTON) 25 MG tablet Take 25 mg by mouth daily.    Marland Kitchen docusate sodium (COLACE) 100 MG capsule Take 200 mg  by mouth daily.    . insulin glargine (LANTUS) 100 UNIT/ML injection Inject 20 Units into the skin every morning.     . metoprolol (LOPRESSOR) 50 MG tablet Take 50 mg by mouth 2 (two) times daily.      . niacin 500 MG tablet Take 500 mg by mouth daily.    . ramipril (ALTACE) 10 MG capsule Take 10 mg by mouth daily.      . simvastatin (ZOCOR) 20 MG tablet Take 20 mg by mouth daily.  3  . warfarin (COUMADIN) 4 MG tablet Take as directed by dr Renard Matter (Patient taking differently: Take 4 mg by mouth daily. Take as directed by dr Renard Matter) 30 tablet 3  . warfarin (COUMADIN) 5 MG tablet Take as directed by dr Renard Matter (Patient not taking: Reported on 02/08/2015) 30 tablet 6   No current facility-administered medications for this visit.     Past Surgical History  Procedure Laterality Date  . Transurethral resection of prostate       No Known Allergies    Family History  Problem Relation Age of Onset  . Heart disease Sister      Social History Mr. Keimig reports that he quit smoking about 27 years ago. His  smoking use included Cigarettes. He started smoking about 59 years ago. He has a 20 pack-year smoking history. He has never used smokeless tobacco. Mr. Nicol reports that he drinks alcohol.   Review of Systems CONSTITUTIONAL: No weight loss, fever, chills, weakness or fatigue.  HEENT: Eyes: No visual loss, blurred vision, double vision or yellow sclerae.No hearing loss, sneezing, congestion, runny nose or sore throat.  SKIN: No rash or itching.  CARDIOVASCULAR: per HPI RESPIRATORY: No shortness of breath, cough or sputum.  GASTROINTESTINAL: No anorexia, nausea, vomiting or diarrhea. No abdominal pain or blood.  GENITOURINARY: No burning on urination, no polyuria NEUROLOGICAL: No headache, dizziness, syncope, paralysis, ataxia, numbness or tingling in the extremities. No change in bowel or bladder control.  MUSCULOSKELETAL: No muscle, back pain, joint pain or stiffness.    LYMPHATICS: No enlarged nodes. No history of splenectomy.  PSYCHIATRIC: No history of depression or anxiety.  ENDOCRINOLOGIC: No reports of sweating, cold or heat intolerance. No polyuria or polydipsia.  Marland Kitchen   Physical Examination Filed Vitals:   05/05/15 0838  BP: 118/60  Pulse: 75   Filed Vitals:   05/05/15 0838  Height:  (1.753 m)  Weight: 199 lb (90.266 kg)    Gen: resting comfortably, no acute distress HEENT: no scleral icterus, pupils equal round and reactive, no palptable cervical adenopathy,  CV: irreg, no m/r/g, no jvd Resp: Clear to auscultation bilaterally GI: abdomen is soft, non-tender, non-distended, normal bowel sounds, no hepatosplenomegaly MSK: extremities are warm, no edema.  Skin: warm, no rash Neuro:  no focal deficits Psych: appropriate affect   Diagnostic Studies  05/2001 MPI  THE LEFT VENTRICLE WAS MILDLY DILATED. ON TOMOGRAPHIC IMAGES RECONSTRUCTED IN  STANDARD PLANES, THERE WAS A MILD DEFECT INVOLVING THE INFERIOR AND INFEROLATERAL MYOCARDIUM THAT  DEMONSTRATED NO REVERSIBILITY WHEN COMPARED TO REST IMAGES. THE GATED 3-D RECONSTRUCTION REVEALED  GLOBAL HYPOKINESIS WITH AN ESTIMATED EJECTION FRACTION OF .38. ON THE GATED TOMOGRAMS, THERE WAS  DECREASED SYSTOLIC ACCENTUATION OF ACTIVITY IN THE INFERIOR WALL. IMPRESSION ABNORMAL STRESS CARDIOLITE STUDY WITH IMPAIRED EXERCISE CAPACITY, A  NEGATIVE STRESS EKG, MILD LEFT VENTRICULAR DILATATION WITH MILD TO MODERATE GLOBAL SYSTOLIC DYSFUNCTION;  SCINTIGRAPHIC ABNORMALITIES MAY BE ARTIFACTUAL DUE TO DIAPHRAGMATIC ATTENUATION. NO  MYOCARDIAL ISCHEMIA IDENTIFIED. OTHER FINDINGS AS NOTED.   11/2009 Echo  LVEF 60-65%, cannot evaluate diastolic function.     Assessment and Plan   1. CAD  - presumed CAD based on evidence of infarct by imaging, no prior clinical event  -no recent symptoms - continue current meds  2. Afib  - no current symptoms  -we continue current meds including  coumadin for stroke prevention  3. HTN  - at goal, we will continue current meds   4. Hyperlipidemia  - request labs from pcp - continue current statin    Antoine Poche, M.D.

## 2015-05-23 DIAGNOSIS — I480 Paroxysmal atrial fibrillation: Secondary | ICD-10-CM | POA: Diagnosis not present

## 2015-05-23 DIAGNOSIS — I10 Essential (primary) hypertension: Secondary | ICD-10-CM | POA: Diagnosis not present

## 2015-05-23 DIAGNOSIS — R7309 Other abnormal glucose: Secondary | ICD-10-CM | POA: Diagnosis not present

## 2015-05-23 DIAGNOSIS — E782 Mixed hyperlipidemia: Secondary | ICD-10-CM | POA: Diagnosis not present

## 2015-05-23 DIAGNOSIS — E118 Type 2 diabetes mellitus with unspecified complications: Secondary | ICD-10-CM | POA: Diagnosis not present

## 2015-05-24 ENCOUNTER — Encounter (HOSPITAL_COMMUNITY): Payer: Self-pay

## 2015-05-24 ENCOUNTER — Inpatient Hospital Stay (HOSPITAL_COMMUNITY): Payer: Medicare Other

## 2015-05-24 ENCOUNTER — Inpatient Hospital Stay (HOSPITAL_COMMUNITY)
Admission: EM | Admit: 2015-05-24 | Discharge: 2015-05-30 | DRG: 813 | Disposition: A | Discharge status: Skilled Nursing Facility | Payer: Medicare Other | Attending: Internal Medicine | Admitting: Internal Medicine

## 2015-05-24 DIAGNOSIS — K219 Gastro-esophageal reflux disease without esophagitis: Secondary | ICD-10-CM | POA: Diagnosis not present

## 2015-05-24 DIAGNOSIS — N183 Chronic kidney disease, stage 3 unspecified: Secondary | ICD-10-CM | POA: Diagnosis present

## 2015-05-24 DIAGNOSIS — M19072 Primary osteoarthritis, left ankle and foot: Secondary | ICD-10-CM | POA: Diagnosis not present

## 2015-05-24 DIAGNOSIS — R0602 Shortness of breath: Secondary | ICD-10-CM

## 2015-05-24 DIAGNOSIS — M79672 Pain in left foot: Secondary | ICD-10-CM

## 2015-05-24 DIAGNOSIS — I2510 Atherosclerotic heart disease of native coronary artery without angina pectoris: Secondary | ICD-10-CM | POA: Diagnosis not present

## 2015-05-24 DIAGNOSIS — M1000 Idiopathic gout, unspecified site: Secondary | ICD-10-CM | POA: Diagnosis not present

## 2015-05-24 DIAGNOSIS — I129 Hypertensive chronic kidney disease with stage 1 through stage 4 chronic kidney disease, or unspecified chronic kidney disease: Secondary | ICD-10-CM | POA: Diagnosis not present

## 2015-05-24 DIAGNOSIS — R488 Other symbolic dysfunctions: Secondary | ICD-10-CM | POA: Diagnosis not present

## 2015-05-24 DIAGNOSIS — N189 Chronic kidney disease, unspecified: Secondary | ICD-10-CM

## 2015-05-24 DIAGNOSIS — Z87891 Personal history of nicotine dependence: Secondary | ICD-10-CM | POA: Diagnosis not present

## 2015-05-24 DIAGNOSIS — M109 Gout, unspecified: Secondary | ICD-10-CM | POA: Diagnosis not present

## 2015-05-24 DIAGNOSIS — E785 Hyperlipidemia, unspecified: Secondary | ICD-10-CM | POA: Diagnosis not present

## 2015-05-24 DIAGNOSIS — R042 Hemoptysis: Secondary | ICD-10-CM | POA: Diagnosis not present

## 2015-05-24 DIAGNOSIS — Z79899 Other long term (current) drug therapy: Secondary | ICD-10-CM

## 2015-05-24 DIAGNOSIS — D649 Anemia, unspecified: Secondary | ICD-10-CM | POA: Diagnosis not present

## 2015-05-24 DIAGNOSIS — Z6830 Body mass index (BMI) 30.0-30.9, adult: Secondary | ICD-10-CM | POA: Diagnosis not present

## 2015-05-24 DIAGNOSIS — E43 Unspecified severe protein-calorie malnutrition: Secondary | ICD-10-CM | POA: Diagnosis not present

## 2015-05-24 DIAGNOSIS — T45515A Adverse effect of anticoagulants, initial encounter: Secondary | ICD-10-CM | POA: Diagnosis not present

## 2015-05-24 DIAGNOSIS — Z7901 Long term (current) use of anticoagulants: Secondary | ICD-10-CM

## 2015-05-24 DIAGNOSIS — I4891 Unspecified atrial fibrillation: Secondary | ICD-10-CM | POA: Diagnosis present

## 2015-05-24 DIAGNOSIS — R233 Spontaneous ecchymoses: Secondary | ICD-10-CM | POA: Diagnosis not present

## 2015-05-24 DIAGNOSIS — R791 Abnormal coagulation profile: Secondary | ICD-10-CM | POA: Diagnosis present

## 2015-05-24 DIAGNOSIS — E119 Type 2 diabetes mellitus without complications: Secondary | ICD-10-CM

## 2015-05-24 DIAGNOSIS — E1122 Type 2 diabetes mellitus with diabetic chronic kidney disease: Secondary | ICD-10-CM | POA: Diagnosis present

## 2015-05-24 DIAGNOSIS — Z794 Long term (current) use of insulin: Secondary | ICD-10-CM | POA: Diagnosis not present

## 2015-05-24 DIAGNOSIS — N179 Acute kidney failure, unspecified: Secondary | ICD-10-CM | POA: Diagnosis present

## 2015-05-24 DIAGNOSIS — R262 Difficulty in walking, not elsewhere classified: Secondary | ICD-10-CM | POA: Diagnosis not present

## 2015-05-24 DIAGNOSIS — I10 Essential (primary) hypertension: Secondary | ICD-10-CM | POA: Diagnosis present

## 2015-05-24 DIAGNOSIS — I480 Paroxysmal atrial fibrillation: Secondary | ICD-10-CM | POA: Diagnosis present

## 2015-05-24 DIAGNOSIS — R278 Other lack of coordination: Secondary | ICD-10-CM | POA: Diagnosis not present

## 2015-05-24 DIAGNOSIS — M10372 Gout due to renal impairment, left ankle and foot: Secondary | ICD-10-CM | POA: Diagnosis not present

## 2015-05-24 DIAGNOSIS — M6281 Muscle weakness (generalized): Secondary | ICD-10-CM | POA: Diagnosis not present

## 2015-05-24 DIAGNOSIS — R05 Cough: Secondary | ICD-10-CM | POA: Diagnosis not present

## 2015-05-24 DIAGNOSIS — I482 Chronic atrial fibrillation: Secondary | ICD-10-CM | POA: Diagnosis not present

## 2015-05-24 LAB — GLUCOSE, CAPILLARY
GLUCOSE-CAPILLARY: 157 mg/dL — AB (ref 65–99)
Glucose-Capillary: 128 mg/dL — ABNORMAL HIGH (ref 65–99)

## 2015-05-24 LAB — BASIC METABOLIC PANEL
ANION GAP: 12 (ref 5–15)
BUN: 140 mg/dL — ABNORMAL HIGH (ref 6–20)
CALCIUM: 8.2 mg/dL — AB (ref 8.9–10.3)
CO2: 16 mmol/L — ABNORMAL LOW (ref 22–32)
Chloride: 114 mmol/L — ABNORMAL HIGH (ref 101–111)
Creatinine, Ser: 3.63 mg/dL — ABNORMAL HIGH (ref 0.61–1.24)
GFR, EST AFRICAN AMERICAN: 17 mL/min — AB (ref >60)
GFR, EST NON AFRICAN AMERICAN: 15 mL/min — AB (ref >60)
GLUCOSE: 173 mg/dL — AB (ref 65–99)
Potassium: 3.9 mmol/L (ref 3.5–5.1)
SODIUM: 142 mmol/L (ref 135–145)

## 2015-05-24 LAB — URINALYSIS, ROUTINE W REFLEX MICROSCOPIC
Glucose, UA: NEGATIVE mg/dL
Hgb urine dipstick: NEGATIVE
KETONES UR: NEGATIVE mg/dL
LEUKOCYTES UA: NEGATIVE
NITRITE: NEGATIVE
PROTEIN: NEGATIVE mg/dL
Specific Gravity, Urine: 1.015 (ref 1.005–1.030)
pH: 5 (ref 5.0–8.0)

## 2015-05-24 LAB — CBC WITH DIFFERENTIAL/PLATELET
BASOS ABS: 0 10*3/uL (ref 0.0–0.1)
BASOS PCT: 0 %
EOS ABS: 0.1 10*3/uL (ref 0.0–0.7)
EOS PCT: 1 %
HCT: 37.1 % — ABNORMAL LOW (ref 39.0–52.0)
Hemoglobin: 12.9 g/dL — ABNORMAL LOW (ref 13.0–17.0)
Lymphocytes Relative: 16 %
Lymphs Abs: 1.5 10*3/uL (ref 0.7–4.0)
MCH: 32.7 pg (ref 26.0–34.0)
MCHC: 34.8 g/dL (ref 30.0–36.0)
MCV: 94.2 fL (ref 78.0–100.0)
MONO ABS: 1 10*3/uL (ref 0.1–1.0)
Monocytes Relative: 10 %
Neutro Abs: 7.3 10*3/uL (ref 1.7–7.7)
Neutrophils Relative %: 73 %
PLATELETS: 174 10*3/uL (ref 150–400)
RBC: 3.94 MIL/uL — AB (ref 4.22–5.81)
RDW: 14.8 % (ref 11.5–15.5)
WBC: 9.9 10*3/uL (ref 4.0–10.5)

## 2015-05-24 LAB — TYPE AND SCREEN
ABO/RH(D): A POS
Antibody Screen: NEGATIVE

## 2015-05-24 LAB — MRSA PCR SCREENING: MRSA by PCR: NEGATIVE

## 2015-05-24 LAB — PROTIME-INR

## 2015-05-24 MED ORDER — SODIUM CHLORIDE 0.9 % IV BOLUS (SEPSIS)
1000.0000 mL | Freq: Once | INTRAVENOUS | Status: AC
  Administered 2015-05-24: 1000 mL via INTRAVENOUS

## 2015-05-24 MED ORDER — ONDANSETRON HCL 4 MG PO TABS: 4.0000 mg | ORAL_TABLET | Freq: Four times a day (QID) | ORAL | Status: DC | PRN

## 2015-05-24 MED ORDER — ACETAMINOPHEN 650 MG RE SUPP: 650.0000 mg | Freq: Four times a day (QID) | RECTAL | Status: DC | PRN

## 2015-05-24 MED ORDER — METOPROLOL TARTRATE 50 MG PO TABS
50.0000 mg | ORAL_TABLET | Freq: Two times a day (BID) | ORAL | Status: DC
  Administered 2015-05-25 – 2015-05-30 (×10): 50 mg via ORAL
  Filled 2015-05-24 (×11): qty 1

## 2015-05-24 MED ORDER — SODIUM CHLORIDE 0.9% FLUSH
3.0000 mL | Freq: Two times a day (BID) | INTRAVENOUS | Status: DC
  Administered 2015-05-24 – 2015-05-30 (×8): 3 mL via INTRAVENOUS

## 2015-05-24 MED ORDER — PANTOPRAZOLE SODIUM 40 MG IV SOLR
40.0000 mg | Freq: Two times a day (BID) | INTRAVENOUS | Status: DC
  Administered 2015-05-24 – 2015-05-27 (×6): 40 mg via INTRAVENOUS
  Filled 2015-05-24 (×6): qty 40

## 2015-05-24 MED ORDER — ACETAMINOPHEN 325 MG PO TABS
650.0000 mg | ORAL_TABLET | Freq: Four times a day (QID) | ORAL | Status: DC | PRN
Start: 2015-05-24 — End: 2015-05-30
  Administered 2015-05-26 – 2015-05-27 (×2): 650 mg via ORAL
  Filled 2015-05-24 (×2): qty 2

## 2015-05-24 MED ORDER — INSULIN ASPART 100 UNIT/ML ~~LOC~~ SOLN
0.0000 [IU] | Freq: Three times a day (TID) | SUBCUTANEOUS | Status: DC
  Administered 2015-05-24: 1 [IU] via SUBCUTANEOUS
  Administered 2015-05-25 (×2): 2 [IU] via SUBCUTANEOUS
  Administered 2015-05-26: 3 [IU] via SUBCUTANEOUS
  Administered 2015-05-26 – 2015-05-27 (×3): 2 [IU] via SUBCUTANEOUS
  Administered 2015-05-27: 9 [IU] via SUBCUTANEOUS
  Administered 2015-05-27 – 2015-05-28 (×2): 3 [IU] via SUBCUTANEOUS

## 2015-05-24 MED ORDER — IPRATROPIUM-ALBUTEROL 0.5-2.5 (3) MG/3ML IN SOLN: 3.0000 mL | RESPIRATORY_TRACT | Status: DC | PRN

## 2015-05-24 MED ORDER — AMLODIPINE BESYLATE 5 MG PO TABS
5.0000 mg | ORAL_TABLET | Freq: Every day | ORAL | Status: DC
  Administered 2015-05-25: 5 mg via ORAL
  Filled 2015-05-24: qty 1

## 2015-05-24 MED ORDER — VITAMIN K1 10 MG/ML IJ SOLN
10.0000 mg | Freq: Once | INTRAMUSCULAR | Status: AC
  Administered 2015-05-24: 10 mg via SUBCUTANEOUS
  Filled 2015-05-24: qty 1

## 2015-05-24 MED ORDER — INSULIN ASPART 100 UNIT/ML ~~LOC~~ SOLN: 0.0000 [IU] | Freq: Every day | SUBCUTANEOUS | Status: DC

## 2015-05-24 MED ORDER — SODIUM CHLORIDE 0.9 % IV SOLN
10.0000 mL/h | Freq: Once | INTRAVENOUS | Status: AC
  Administered 2015-05-24: 10 mL/h via INTRAVENOUS

## 2015-05-24 MED ORDER — SIMVASTATIN 20 MG PO TABS
20.0000 mg | ORAL_TABLET | Freq: Every day | ORAL | Status: DC
  Administered 2015-05-25 – 2015-05-30 (×6): 20 mg via ORAL
  Filled 2015-05-24 (×6): qty 1

## 2015-05-24 MED ORDER — ONDANSETRON HCL 4 MG/2ML IJ SOLN: 4.0000 mg | Freq: Four times a day (QID) | INTRAMUSCULAR | Status: DC | PRN

## 2015-05-24 MED ORDER — INSULIN GLARGINE 100 UNIT/ML ~~LOC~~ SOLN
20.0000 [IU] | Freq: Every day | SUBCUTANEOUS | Status: DC
  Administered 2015-05-25 – 2015-05-30 (×6): 20 [IU] via SUBCUTANEOUS
  Filled 2015-05-24 (×8): qty 0.2

## 2015-05-24 MED ORDER — ENSURE ENLIVE PO LIQD
237.0000 mL | Freq: Two times a day (BID) | ORAL | Status: DC
  Administered 2015-05-25 – 2015-05-30 (×9): 237 mL via ORAL

## 2015-05-24 NOTE — H&P (Addendum)
[facility] History and Physical  Matthew Boyd WUJ:[id number] DOB: 1933/10/22 DOA: 05/24/2015  Referring physician: ED PCP: Pearson GrippeJames Kim, MD   Chief Complaint: Abnormal lab work  HPI:  [name] is a 82 year old male with past medical history of PAF on chronic anticoagulation, HTN, HLD, DM type II, CKD stage 3; who presents at the request of his PCP Dr. Selena BattenKim after being found to have a supratherapeutic INR. Patient notes that he had routine blood work taken yesterday, and he had followed up with his primary care in the office this morning. He was sent back to lab core for repeat check to confirm the results. Later on this afternoon he was advised to come to the hospital to receive vitamin K as his INR was significantly high. His wife noticed that approximately 6 days ago he started to develop these large bruises on his arms, legs, and and sides. He has not taken his Coumadin for at least 2 days now, normally taking 4 mg of coumadin per day. The patient has had a difficult time with controlling his INR in the past for which they had discussed possibly switching him to Eliquis, but due to cost of this medication it  was never done. He complains of associated symptoms of small amount of hemoptysis, heartburn, and mild shortness of breath Upon admission patient was evaluated and his INR was found to be greater than 10. Lab work also revealed a rise in his creatinine to 3.63 and BUN to 140. Patient's has a baseline creatinine of around 2 per review of last hospitalization in 02/2015. During that same hospitalization where patient was noted to have a supratherapeutic INR 4.81 at the time.  Review of Systems  Constitutional: Positive for malaise/fatigue. Negative for chills.  HENT: Negative for hearing loss.   Eyes: Negative for photophobia and pain.  Respiratory: Positive for hemoptysis and shortness of breath.   Cardiovascular: Negative for chest pain and orthopnea.  Gastrointestinal: Positive for  heartburn. Negative for blood in stool.  Genitourinary: Negative for frequency and hematuria.  Musculoskeletal: Positive for joint pain. Negative for falls.  Skin: Negative for itching.  Neurological: Negative for sensory change, speech change and headaches.  Endo/Heme/Allergies: Negative for polydipsia. Bruises/bleeds easily.  Psychiatric/Behavioral: Negative for hallucinations and substance abuse.        Past Medical History  Diagnosis Date  . Arteriosclerotic cardiovascular disease (ASCVD)     prior MI by echocardiography; stress nuclear -inferior scar; no prior cath  . Paroxysmal atrial fibrillation (HCC)  10/2009    onset in 10/2009; anticoagulation  . Hypertension   . Hyperlipidemia   . Diabetes mellitus type 2, insulin dependent (HCC)   . Nephrolithiasis     surgical stone extractionx2  . Chronic kidney disease     Creatinine of 1.5 in 11/03 and 1.8 in 10/08; 1.75 in 09/2009  . 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 Benign prostatic hypertrophy     s/p transurethral resection of the prostate     Past Surgical History  Procedure Laterality Date  . Transurethral resection of prostate        Social History:  reports that he quit smoking about 27 years ago. His smoking use included Cigarettes. He started smoking about 59 years ago. He has a 20 pack-year smoking history. He has never used smokeless tobacco. He reports that he drinks alcohol. He reports that he does not use illicit drugs. Where does patient live--home  and with whom if at home? wife Can patient participate in ADLs? yes  No Known Allergies  Family History  Problem Relation Age of Onset  . Heart disease Sister        Prior to Admission medications   Medication Sig Start Date End Date Taking? Authorizing Provider  amLODipine (NORVASC) 5 MG tablet Take 5 mg by mouth daily.  01/25/15  Yes Historical Provider, MD  chlorthalidone (HYGROTON) 25 MG tablet Take 25 mg by  mouth daily.   Yes Historical Provider, MD  docusate sodium (COLACE) 100 MG capsule Take 200 mg by mouth daily.   Yes Historical Provider, MD  Insulin Glargine (LANTUS SOLOSTAR) 100 UNIT/ML Solostar Pen Inject 20 Units into the skin daily.   Yes Historical Provider, MD  metoprolol (LOPRESSOR) 50 MG tablet Take 50 mg by mouth 2 (two) times daily.     Yes Historical Provider, MD  niacin 500 MG tablet Take 500 mg by mouth daily.   Yes Historical Provider, MD  ramipril (ALTACE) 10 MG capsule Take 10 mg by mouth daily.     Yes Historical Provider, MD  simvastatin (ZOCOR) 20 MG tablet Take 20 mg by mouth daily. 01/25/15  Yes Historical Provider, MD  warfarin (COUMADIN) 4 MG tablet Take as directed by dr Matthew Boyd Patient taking differently: Take 4 mg by mouth daily. Take as directed by dr Matthew Boyd 10/05/13  Yes Matthew Penny, MD  acetaminophen (TYLENOL) 500 MG tablet Take 1,000 mg by mouth every 6 (six) hours as needed for mild pain or moderate pain.     Historical Provider, MD  warfarin (COUMADIN) 5 MG tablet Take as directed by dr Matthew Boyd Patient not taking: Reported on 05/24/2015 10/05/13   Matthew Penny, MD     Physical Exam: Filed Vitals:   05/24/15 1342 05/24/15 1430 05/24/15 1500  BP:  Pulse: 43 81 99  Temp: 98 F (36.7 C)    TempSrc: Temporal    Resp: Height:  (1.676 m)    Weight: 85.276 kg (188 lb)    SpO2: 94% 100% 92%     Constitutional: Vital signs reviewed. Patient is a elderly male who appears ill, but nontoxic. Alert and oriented 3. Head: Normocephalic and atraumatic  Ear: TM normal bilaterally  Mouth:  Few scattered mucosal petechiae noted. Eyes: PERRL, EOMI, conjunctivae normal, No scleral icterus.  Neck: Supple, Trachea midline normal ROM, No JVD, mass, thyromegaly, or carotid bruit present.  Cardiovascular: RRR, S1 normal, S2 normal, no MRG, pulses symmetric and intact bilaterally  Pulmonary/Chest: CTAB, no wheezes, rales, or rhonchi   Abdominal: Soft. Non-tender, non-distended, bowel sounds are normal, no masses, organomegaly, or guarding present.  GU: no CVA tenderness Musculoskeletal: No joint deformities, erythema, or stiffness, ROM full and no nontender Ext: no edema and no cyanosis, pulses palpable bilaterally (DP and PT)  Hematology: no cervical, inginal, or axillary adenopathy.  Neurological: A&O x3, Strenght is normal and symmetric bilaterally, cranial nerve II-XII are grossly intact, no focal motor deficit, sensory intact to light touch bilaterally.  Skin: Multiple ecchymoses to the arms, legs, and side. The largest is at least 4 x 6 inch diameter on the right axilla going down his right side.  Psychiatric: Normal mood and affect. speech and behavior is normal. Judgment and thought content normal. Cognition and memory are normal.      Data Review   Micro Results No results found for this or any previous visit (from the past 240 hour(s)).  Radiology Reports No results found.   CBC  Recent Labs Lab 05/24/15 1351  WBC 9.9  HGB 12.9*  HCT 37.1*  PLT 174  MCV 94.2  MCH 32.7  MCHC 34.8  RDW 14.8  LYMPHSABS 1.5  MONOABS 1.0  EOSABS 0.1  BASOSABS 0.0    Chemistries   Recent Labs Lab 05/24/15 1405  NA 142  K 3.9  CL 114*  CO2 16*  GLUCOSE 173*  BUN 140*  CREATININE 3.63*  CALCIUM 8.2*   ------------------------------------------------------------------------------------------------------------------ estimated creatinine clearance is 16.9 mL/min (by C-G formula based on Cr of 3.63). ------------------------------------------------------------------------------------------------------------------ No results for input(s): HGBA1C in the last 72 hours. ------------------------------------------------------------------------------------------------------------------ No results for input(s): CHOL, HDL, LDLCALC, TRIG, CHOLHDL, LDLDIRECT in the last 72  hours. ------------------------------------------------------------------------------------------------------------------ No results for input(s): TSH, T4TOTAL, T3FREE, THYROIDAB in the last 72 hours.  Invalid input(s): FREET3 ------------------------------------------------------------------------------------------------------------------ No results for input(s): VITAMINB12, FOLATE, FERRITIN, TIBC, IRON, RETICCTPCT in the last 72 hours.  Coagulation profile  Recent Labs Lab 05/24/15 1405  INR >10.00*    No results for input(s): DDIMER in the last 72 hours.  Cardiac Enzymes No results for input(s): CKMB, TROPONINI, MYOGLOBIN in the last 168 hours.  Invalid input(s): CK ------------------------------------------------------------------------------------------------------------------ Invalid input(s): POCBNP   CBG: No results for input(s): GLUCAP in the last 168 hours.     EKG: Independently reviewed. Atrial fibrillation rate of 88   Assessment/Plan Supratherapeutic INR: Acute. Patient with INR noted to be greater than 10 on admission patient was given 10 mg of vitamin K and type and screen to receive 4 units of fresh frozen plasma. - Admit to stepdown - Check INR at 8 PM and in am - recheck H&H at 8 PM - Holding Coumadin tonight. Coumadin per pharmacy when willing to restart  Chronic atrial fibrillation: Patient appears to be rate controlled at this time. - Continue rate controlling medications of metoprolol - Social work consult for patient's options for newer anticoagulation chest  Acute kidney injury on chronic kidney disease stage III: Patient's baseline creatinine appears to be approximately 2. Creatinine acutely elevated at 3.63 with a BUN significantly elevated at 140. Patient was given 1 L of normal saline IV fluids in the ED. - will continue to monitor this patient as also given fresh frozen plasma and not want to overload patient - Question urinary retention.  Checking postvoid residual if greater than 300 ml will place Foley - Avoid nephrotoxic agents  Gerd: Showed elevated BUN of 140 suspect some aspect of possible mucosal bleeding with patient supratherapeutic INR. - IV Protonix for elevated BUN and patient's complaints of the nausea suspect some GI irritation and possible bleeding  Diabetes mellitus type 2 - Carb modified diet as tolerated - CBGs every before meals with cystoscopy sliding-scale insulin - Continue patient's  home regimen of Lantus 20 units subcutaneous daily  Essential hypertension - Continue metoprolol - Held the chlorthalidone and Altace secondary to acute kidney injury  Hyperlipidemia - Continue simvastatin  Anemia: Hemoglobin stable at 12.9 upon admission. -Continue to monitor H&H and transfuse blood products if needed Code Status:   full Family Communication: bedside Disposition Plan: admit   Total time spent 55 minutes.Greater than 50% of this time was spent in counseling, explanation of diagnosis, planning of further management, and coordination of care  Clydie Braun [facility] Pager [phone]  If 7PM-7AM, please contact night-coverage [email or web address] Password [facility] 05/24/2015, 3:57 PM

## 2015-05-24 NOTE — ED Notes (Signed)
INR >10 reported to nurse from Lab.  Dr. Jeraldine LootsLockwood notified.

## 2015-05-24 NOTE — Progress Notes (Signed)
ANTICOAGULATION CONSULT NOTE - Initial Consult  Pharmacy Consult for Coumadin Indication: atrial fibrillation  No Known Allergies  Patient Measurements: Height: 5\' 7"  (170.2 cm) Weight: 188 lb 4.4 oz (85.4 kg) IBW/kg (Calculated) : 66.1  Vital Signs: Temp: 97.9 F (36.6 C) (03/21 2052) Temp Source: Oral (03/21 2052) BP: 93/54 mmHg (03/21 2100) Pulse Rate: 85 (03/21 2100)  Labs:  Recent Labs  05/24/15 1351 05/24/15 1405  HGB 12.9*  --   HCT 37.1*  --   PLT 174  --   LABPROT  --  >90.0*  INR  --  >10.00*  CREATININE  --  3.63*    Estimated Creatinine Clearance: 17.2 mL/min (by C-G formula based on Cr of 3.63).   Medical History: Past Medical History  Diagnosis Date  . Arteriosclerotic cardiovascular disease (ASCVD)     prior MI by echocardiography; stress nuclear -inferior scar; no prior cath  . Paroxysmal atrial fibrillation (HCC)  10/2009    onset in 10/2009; anticoagulation  . Hypertension   . Hyperlipidemia   . Diabetes mellitus type 2, insulin dependent (HCC)   . Nephrolithiasis     surgical stone extractionx2  . Chronic kidney disease     Creatinine of 1.5 in 11/03 and 1.8 in 10/08; 1.75 in 09/2009  . 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 Benign prostatic hypertrophy     s/p transurethral resection of the prostate    Medications:  See med rec  Assessment: [id number] male who presented to the ED with supratherapeutic INR>10. Patient has noticed large bruises on his arms, legs, and sides. He has not taken coumadin for 2 days. He has symptoms of small amount of hemoptysis, heartburn, and mild shortness of breath. He has had difficulty controlling INR and considered eliquis but did not change due to cost.  Will hold Coumadin until in therapeutic range. Vitamin K 10mg  sq once and transfused 4 units of fresh frozen plasma.   Goal of Therapy:  INR 2-3 Monitor platelets by anticoagulation protocol: Yes   Plan:  Hold coumadin Daily  PT/INR Monitor for S/S of bleeding  Elder CyphersLorie Norell Brisbin, BS Loura BackPharm D, BCPS Clinical Pharmacist Pager [phone] 05/24/2015,9:47 PM

## 2015-05-24 NOTE — ED Notes (Signed)
Pt reports had blood work done this morning and was told his blood was too thin and needed to come to er.

## 2015-05-24 NOTE — ED Provider Notes (Signed)
CSN: [id number]     Arrival date & time 05/24/15  1336 History   First MD Initiated Contact with Patient 05/24/15 1349     Chief Complaint  Patient presents with  . Abnormal Lab     (Consider location/radiation/quality/duration/timing/severity/associated sxs/prior Treatment) HPI Patient presents with his wife assist with the history of present illness. Patient uses Coumadin for atrial fibrillation control per They note that over the past few days the patient has had diffuse areas of ecchymosis that are new, in both arms, his face, legs. There is also hemoptysis, with trace amounts of blood in sputum, as well as bloody gums. Patient states that he occasionally feels rough, but currently denies any pain, dyspnea, nausea. Patient has not taken any Coumadin in 36 hours. Patient had abnormal blood yesterday, and again this morning. He was referred here for evaluation. He denies syncope, near syncope, chest pain specifically.  Past Medical History  Diagnosis Date  . Arteriosclerotic cardiovascular disease (ASCVD)     prior MI by echocardiography; stress nuclear -inferior scar; no prior cath  . Paroxysmal atrial fibrillation (HCC)  10/2009    onset in 10/2009; anticoagulation  . Hypertension   . Hyperlipidemia   . Diabetes mellitus type 2, insulin dependent (HCC)   . Nephrolithiasis     surgical stone extractionx2  . Chronic kidney disease     Creatinine of 1.5 in 11/03 and 1.8 in 10/08; 1.75 in 09/2009  . 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 Benign prostatic hypertrophy     s/p transurethral resection of the prostate   Past Surgical History  Procedure Laterality Date  . Transurethral resection of prostate     Family History  Problem Relation Age of Onset  . Heart disease Sister    Social History  Substance Use Topics  . Smoking status: Former Smoker -- 1.00 packs/day for 20 years    Types: Cigarettes    Start date: 08/29/1955    Quit date:  03/05/1988  . Smokeless tobacco: Never Used  . Alcohol Use: 0.0 oz/week    0 Standard drinks or equivalent per week     Comment: Excessive use in the 1990s 04/08/14- no longer drinks     Review of Systems  Constitutional:       Per HPI, otherwise negative  HENT:       Per HPI, otherwise negative  Respiratory:       Per HPI, otherwise negative  Cardiovascular:       Per HPI, otherwise negative  Gastrointestinal: Negative for vomiting.  Endocrine:       Negative aside from HPI  Genitourinary:       Neg aside from HPI   Musculoskeletal:       Per HPI, otherwise negative  Skin: Positive for color change.  Neurological: Positive for weakness. Negative for syncope.  Hematological: Bruises/bleeds easily.      Allergies  Review of patient's allergies indicates no known allergies.  Home Medications   Prior to Admission medications   Medication Sig Start Date End Date Taking? Authorizing Provider  warfarin (COUMADIN) 4 MG tablet Take as directed by dr Renard Matter Patient taking differently: Take 4 mg by mouth daily. Take as directed by dr Renard Matter 10/05/13  Yes Butch Penny, MD  acetaminophen (TYLENOL) 500 MG tablet Take 1,000 mg by mouth every 6 (six) hours as needed for mild pain or moderate pain.     Historical Provider, MD  amLODipine (NORVASC)  5 MG tablet Take 5 mg by mouth daily.  01/25/15   Historical Provider, MD  chlorthalidone (HYGROTON) 25 MG tablet Take 25 mg by mouth daily.    Historical Provider, MD  docusate sodium (COLACE) 100 MG capsule Take 200 mg by mouth daily.    Historical Provider, MD  insulin glargine (LANTUS) 100 UNIT/ML injection Inject 20 Units into the skin every morning.     Historical Provider, MD  metoprolol (LOPRESSOR) 50 MG tablet Take 50 mg by mouth 2 (two) times daily.      Historical Provider, MD  niacin 500 MG tablet Take 500 mg by mouth daily.    Historical Provider, MD  ramipril (ALTACE) 10 MG capsule Take 10 mg by mouth daily.      Historical  Provider, MD  simvastatin (ZOCOR) 20 MG tablet Take 20 mg by mouth daily. 01/25/15   Historical Provider, MD  warfarin (COUMADIN) 5 MG tablet Take as directed by dr Renard Mattermcinnis 10/05/13   Butch PennyAngus McInnis, MD   BP 74/49 mmHg  Pulse 43  Temp(Src) 98 F (36.7 C) (Temporal)  Resp 22  Ht 5\' 6"  (1.676 m)  Wt 188 lb (85.276 kg)  BMI 30.36 kg/m2  SpO2 94% Physical Exam  Constitutional: He is oriented to person, place, and time. He appears well-developed. He appears ill. No distress.  HENT:  Head: Normocephalic and atraumatic.    Eyes: Conjunctivae and EOM are normal.  Cardiovascular: Normal rate and regular rhythm.   Pulmonary/Chest: Effort normal. No stridor. No respiratory distress.  Abdominal: He exhibits no distension.  Musculoskeletal: He exhibits no edema.  Neurological: He is alert and oriented to person, place, and time.  Skin: Skin is warm and dry. Bruising and ecchymosis noted. No petechiae noted.  Psychiatric: He has a normal mood and affect.  Nursing note and vitals reviewed.   ED Course  Procedures (including critical care time) Labs Review Labs Reviewed  PROTIME-INR - Abnormal; Notable for the following:    Prothrombin Time >90.0 (*)    INR >10.00 (*)    All other components within normal limits  CBC WITH DIFFERENTIAL/PLATELET - Abnormal; Notable for the following:    RBC 3.94 (*)    Hemoglobin 12.9 (*)    HCT 37.1 (*)    All other components within normal limits  BASIC METABOLIC PANEL - Abnormal; Notable for the following:    Chloride 114 (*)    CO2 16 (*)    Glucose, Bld 173 (*)    Creatinine, Ser 3.63 (*)    Calcium 8.2 (*)    GFR calc non Af Amer 15 (*)    GFR calc Af Amer 17 (*)    All other components within normal limits  PREPARE FRESH FROZEN PLASMA   I have personally reviewed and evaluated these lab results as part of my medical decision-making.   EKG Interpretation   Date/Time:  Tuesday May 24 2015 13:54:40 EDT Ventricular Rate:  88 PR Interval:     QRS Duration: 89 QT Interval:  368 QTC Calculation: 445 R Axis:   56 Text Interpretation:  Atrial fibrillation Low voltage, precordial leads  Baseline wander in lead(s) II III aVF Atrial fibrillation Low voltage QRS  Artifact Abnormal ekg Confirmed by Gerhard MunchLOCKWOOD, Macallister Ashmead  MD ([id number]) on  05/24/2015 2:11:41 PM        Labs notable for a supratherapeutic INR, greater than 10.  Update:, Patient has evidence for acute renal injury.  3:12 PM On repeat exam the patient is in  similar condition. Blood pressure has improved, 100/55. No new complaints. I discussed all findings with the patient and his wife. With supratherapeutic INR, patient will have FFP, vitamin K. Patient has received fluids, received additional fluids for his acute kidney injury.  MDM  Elderly male with multiple medical issues, including atrial fibrillation presents with several days of increased diffuse ecchymosis, bloody gingiva, and is found to have supratherapeutic INR, greater than 10. Patient is mentating in a typical manner, has no substantial tachycardia, nor persistent hypotension suggesting exsanguination, but with supratherapeutic value, the patient was started on FFP, vitamin K.  Patient also received IV fluids given evidence for acute kidney injury. Given his multiple issues, the patient required admission to the step down unit for further evaluation, management.  CRITICAL CARE Performed by: Gerhard Munch Total critical care time: 40 minutes Critical care time was exclusive of separately billable procedures and treating other patients. Critical care was necessary to treat or prevent imminent or life-threatening deterioration. Critical care was time spent personally by me on the following activities: development of treatment plan with patient and/or surrogate as well as nursing, discussions with consultants, evaluation of patient's response to treatment, examination of patient, obtaining history from patient  or surrogate, ordering and performing treatments and interventions, ordering and review of laboratory studies, ordering and review of radiographic studies, pulse oximetry and re-evaluation of patient's condition.   Gerhard Munch, MD 05/24/15 1515

## 2015-05-24 NOTE — Progress Notes (Signed)
Post void bladder residual was . MD notified.

## 2015-05-25 ENCOUNTER — Inpatient Hospital Stay (HOSPITAL_COMMUNITY): Payer: Medicare Other

## 2015-05-25 DIAGNOSIS — N179 Acute kidney failure, unspecified: Secondary | ICD-10-CM

## 2015-05-25 DIAGNOSIS — Z7901 Long term (current) use of anticoagulants: Secondary | ICD-10-CM

## 2015-05-25 DIAGNOSIS — I10 Essential (primary) hypertension: Secondary | ICD-10-CM

## 2015-05-25 DIAGNOSIS — R791 Abnormal coagulation profile: Secondary | ICD-10-CM

## 2015-05-25 DIAGNOSIS — E43 Unspecified severe protein-calorie malnutrition: Secondary | ICD-10-CM | POA: Diagnosis present

## 2015-05-25 DIAGNOSIS — N183 Chronic kidney disease, stage 3 (moderate): Secondary | ICD-10-CM

## 2015-05-25 DIAGNOSIS — I482 Chronic atrial fibrillation: Secondary | ICD-10-CM

## 2015-05-25 LAB — COMPREHENSIVE METABOLIC PANEL
ALK PHOS: 67 U/L (ref 38–126)
ALT: 18 U/L (ref 17–63)
ANION GAP: 12 (ref 5–15)
AST: 27 U/L (ref 15–41)
Albumin: 4 g/dL (ref 3.5–5.0)
BILIRUBIN TOTAL: 2.1 mg/dL — AB (ref 0.3–1.2)
BUN: 124 mg/dL — ABNORMAL HIGH (ref 6–20)
CALCIUM: 8.5 mg/dL — AB (ref 8.9–10.3)
CO2: 18 mmol/L — ABNORMAL LOW (ref 22–32)
Chloride: 117 mmol/L — ABNORMAL HIGH (ref 101–111)
Creatinine, Ser: 2.63 mg/dL — ABNORMAL HIGH (ref 0.61–1.24)
GFR calc non Af Amer: 22 mL/min — ABNORMAL LOW (ref >60)
GFR, EST AFRICAN AMERICAN: 25 mL/min — AB (ref >60)
Glucose, Bld: 137 mg/dL — ABNORMAL HIGH (ref 65–99)
POTASSIUM: 3.5 mmol/L (ref 3.5–5.1)
SODIUM: 147 mmol/L — AB (ref 135–145)
TOTAL PROTEIN: 7 g/dL (ref 6.5–8.1)

## 2015-05-25 LAB — APTT: aPTT: 42 seconds — ABNORMAL HIGH (ref 24–37)

## 2015-05-25 LAB — CBC
HEMATOCRIT: 31.7 % — AB (ref 39.0–52.0)
HEMOGLOBIN: 10.9 g/dL — AB (ref 13.0–17.0)
MCH: 32.5 pg (ref 26.0–34.0)
MCHC: 34.4 g/dL (ref 30.0–36.0)
MCV: 94.6 fL (ref 78.0–100.0)
Platelets: 147 10*3/uL — ABNORMAL LOW (ref 150–400)
RBC: 3.35 MIL/uL — ABNORMAL LOW (ref 4.22–5.81)
RDW: 14.9 % (ref 11.5–15.5)
WBC: 7.7 10*3/uL (ref 4.0–10.5)

## 2015-05-25 LAB — GLUCOSE, CAPILLARY
GLUCOSE-CAPILLARY: 168 mg/dL — AB (ref 65–99)
GLUCOSE-CAPILLARY: 166 mg/dL — AB (ref 65–99)
Glucose-Capillary: 111 mg/dL — ABNORMAL HIGH (ref 65–99)
Glucose-Capillary: 181 mg/dL — ABNORMAL HIGH (ref 65–99)

## 2015-05-25 LAB — PROTIME-INR
INR: 1.88 — AB (ref 0.00–1.49)
Prothrombin Time: 21.5 seconds — ABNORMAL HIGH (ref 11.6–15.2)

## 2015-05-25 MED ORDER — WARFARIN - PHARMACIST DOSING INPATIENT: Freq: Every day | Status: DC

## 2015-05-25 MED ORDER — DEXTROSE-NACL 5-0.45 % IV SOLN
INTRAVENOUS | Status: DC
  Administered 2015-05-25 – 2015-05-27 (×4): via INTRAVENOUS

## 2015-05-25 MED ORDER — WARFARIN SODIUM 2.5 MG PO TABS
2.5000 mg | ORAL_TABLET | Freq: Once | ORAL | Status: AC
  Administered 2015-05-25: 2.5 mg via ORAL
  Filled 2015-05-25: qty 1

## 2015-05-25 MED ORDER — DEXTROSE-NACL 5-0.45 % IV SOLN
INTRAVENOUS | Status: DC
Start: 2015-05-25 — End: 2015-05-25
  Administered 2015-05-25: 13:00:00 via INTRAVENOUS
  Filled 2015-05-25 (×3): qty 1000

## 2015-05-25 NOTE — Progress Notes (Signed)
CSW received consult for medication questions/insurance benefits. Will defer to CM for follow up and speak with family regarding questions and medication.  If CSW needs arise, please re-consult.    Deretha EmoryHannah Merrel Crabbe LCSW, MSW

## 2015-05-25 NOTE — Progress Notes (Signed)
Initial Nutrition Assessment  DOCUMENTATION CODES:   Severe malnutrition in context of acute illness/injury  INTERVENTION:  Ensure Enlive po BID, each supplement provides 350 kcal and 20 grams of protein. Pt prefers chocolate.  Add prune juice with breakfast daily  MVI daily  NUTRITION DIAGNOSIS:   Malnutrition related to poor appetite as evidenced by per patient/family report, moderate depletion of body fat, moderate depletions of muscle mass.  GOAL:   Patient will meet greater than or equal to 90% of their needs   MONITOR:   PO intake, Diet advancement, Supplement acceptance, Labs, Weight trends  REASON FOR ASSESSMENT:   Malnutrition Screening Tool    ASSESSMENT:   [age] year old male with past medical history of PAF on chronic anticoagulation, HTN, HLD, DM type II, CKD stage 3; who presents at the request of his PCP Dr. Selena BattenKim after being found to have a supratherapeutic INR.    Pt, wife and son are here. All agree that his appetite has been diminished for the past several months. His wife prepares their meals and describes him a "picky eater' even when he feels well. He has a hx of DM according to them has been well controlled. Dr Selena BattenKim checks his A1C every 3 months- unfortunately they don't remember what the average has been.  Last A1C on record 7.3% (2011). Primary concern is his poor appetite. At home they are only eating 2 meals daily: a late breakfast and early dinner. A breakfast example sausage biscuit and coffee. Pt main beverage after breakfast is diet Mt Dew. Based on his diet hx review and physical exam this gentleman is not meeting his daily nutrition needs. His weight is down significantly for both 1 month and 3 month periods (6%) in 30 days and (10%) since December.  Nutrition focused exam completed-pt found to have mild to moderate muscle wasting and loss of fat mass. No edema.   Abnormal labs: sodium 147, BUN 124, Cr 2.63,  glucose 137.   Diet Order:  Diet heart  healthy/carb modified Room service appropriate?: Yes; Fluid consistency:: Thin  Skin:   dry, intact  Last BM:   3/21 loose, soft  Height:   Ht Readings from Last 1 Encounters:  05/24/15 5\' 7"  (1.702 m)    Weight:   Wt Readings from Last 1 Encounters:  05/25/15 188 lb 4.4 oz (85.4 kg)    Ideal Body Weight:  67.2 kg  BMI:  Body mass index is 29.48 kg/(m^2).  Estimated Nutritional Needs:   Kcal:  2087-2255 kcal  Protein:  67 gr protein  Fluid:  >2.0 liters daily  EDUCATION NEEDS:   Education needs addressed  Royann ShiversLynn Brinden Kincheloe MS,RD,CSG,LDN Office: [phone] Pager: [phone]

## 2015-05-25 NOTE — Progress Notes (Signed)
Pt is confused and climbing out of bed. Pt has pulled off all monitoring lines and gown. Pt is reoriented and made comfortable. Wife at bedside. Bed in lowest position and bed alarm on. Will continue to monitor.

## 2015-05-25 NOTE — Progress Notes (Signed)
ANTICOAGULATION CONSULT NOTE - Initial Consult  Pharmacy Consult for Coumadin Indication: atrial fibrillation  No Known Allergies  Patient Measurements: Height: 5\' 7"  (170.2 cm) Weight: 188 lb 4.4 oz (85.4 kg) IBW/kg (Calculated) : 66.1  Vital Signs: Temp: 97.7 F (36.5 C) (03/22 1127) Temp Source: Oral (03/22 1127) BP: 107/53 mmHg (03/22 0150) Pulse Rate: 102 (03/22 0500)  Labs:  Recent Labs  05/24/15 1351 05/24/15 1405 05/25/15 0447  HGB 12.9*  --  10.9*  HCT 37.1*  --  31.7*  PLT 174  --  147*  APTT  --   --  42*  LABPROT  --  >90.0* 21.5*  INR  --  >10.00* 1.88*  CREATININE  --  3.63* 2.63*    Estimated Creatinine Clearance: 23.8 mL/min (by C-G formula based on Cr of 2.63).   Medical History: Past Medical History  Diagnosis Date  . Arteriosclerotic cardiovascular disease (ASCVD)     prior MI by echocardiography; stress nuclear -inferior scar; no prior cath  . Paroxysmal atrial fibrillation (HCC)  10/2009    onset in 10/2009; anticoagulation  . Hypertension   . Hyperlipidemia   . Diabetes mellitus type 2, insulin dependent (HCC)   . Nephrolithiasis     surgical stone extractionx2  . Chronic kidney disease     Creatinine of 1.5 in 11/03 and 1.8 in 10/08; 1.75 in 09/2009  . 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 Benign prostatic hypertrophy     s/p transurethral resection of the prostate    Medications:  See med rec  Assessment: [id number] male who presented to the ED with supratherapeutic INR>10. Patient has noticed large bruises on his arms, legs, and sides. He has not taken coumadin for 2 days. He has symptoms of small amount of hemoptysis, heartburn, and mild shortness of breath. He has had difficulty controlling INR and considered eliquis but did not change due to cost.  Will hold Coumadin until in therapeutic range. Vitamin K 10mg  sq once and transfused 4 units of fresh frozen plasma.  INR this AM is 1.88. Will restart  coumadin. May take a little longer to get therapeutic with administration of Vitamin K.  Goal of Therapy:  INR 2-3 Monitor platelets by anticoagulation protocol: Yes   Plan:  Coumadin 2.5mg  Daily PT/INR Monitor for S/S of bleeding  Elder CyphersLorie Tiffney Haughton, BS Loura BackPharm D, BCPS Clinical Pharmacist Pager [phone] 05/25/2015,12:12 PM

## 2015-05-25 NOTE — Progress Notes (Signed)
[facility] PROGRESS NOTE  Matthew Boyd:[id number] DOB: 1934-01-28 DOA: 05/24/2015 PCP: [name], MD  Assessment/Plan: 1. Supra therapeutic  INR. INR greater than 10 on admission, given  Vitamin K and 4 units of fresh plasma. Coumadin held. INR now trending down. He does have evidence of bruising in right flank. Will check CT abdomen to rule out internal bleeding. 2. Chronic atrial fibrillation, rate controled. Continue metoprolol. Anticoagulation currently on hold. 3. AKI on CKD Stage III. Patient's baseline reported to be appr 2, on admission noted to be 3.63. Improvement noted after given IVF and fresh plasma. Continue IVF and hold chlorthalidone and ACE. Continue to monitor.  4. GERD. Continue PPI 5. Essential HTN, Continue metoprolol. chlorthalidone and Altace held secondary to acute kidney injury. Hold norvasc since blood pressures appear to be running low. 6. HLD, Continue statin. 7. Anemia, Hgb noted to be trending down. Possibly dilutional. Will continue to follow.  Code Status: Full DVT prophylaxis: scd Family Communication: discussed with wife at the bedside Disposition Plan: discharge home once improved   Consultants:    Procedures:    Antibiotics:    HPI/Subjective: Feeling better today. No shortness of breath or chest pain  Objective: Filed Vitals:   05/25/15 0400 05/25/15 0500  BP:    Pulse:  102  Temp: 98.4 F (36.9 C)   Resp: 19 18    Intake/Output Summary (Last 24 hours) at 05/25/15 0659 Last data filed at 05/25/15 0418  Gross per 24 hour  Intake   1210 ml  Output   1650 ml  Net   -440 ml   Filed Weights   05/24/15 1342 05/24/15 1749 05/25/15 0500  Weight: 85.276 kg (188 lb) 85.4 kg (188 lb 4.4 oz) 85.4 kg (188 lb 4.4 oz)    Exam:   General: NAD, looks comfortable  Cardiovascular: irregular S1, S2   Respiratory: clear bilaterally, No wheezing, rales or rhonchi  Abdomen: soft, non tender, no distention , bowel  sounds normal  Musculoskeletal: No edema b/l  Skin: bruising noted over arms bilaterally and right flank   Data Reviewed: Basic Metabolic Panel:  Recent Labs Lab 05/24/15 1405 05/25/15 0447  NA 142 147*  K 3.9 3.5  CL 114* 117*  CO2 16* 18*  GLUCOSE 173* 137*  BUN 140* 124*  CREATININE 3.63* 2.63*  CALCIUM 8.2* 8.5*   Liver Function Tests:  Recent Labs Lab 05/25/15 0447  AST 27  ALT 18  ALKPHOS 67  BILITOT 2.1*  PROT 7.0  ALBUMIN 4.0   No results for input(s): LIPASE, AMYLASE in the last 168 hours. No results for input(s): AMMONIA in the last 168 hours. CBC:  Recent Labs Lab 05/24/15 1351 05/25/15 0447  WBC 9.9 7.7  NEUTROABS 7.3  --   HGB 12.9* 10.9*  HCT 37.1* 31.7*  MCV 94.2 94.6  PLT 174 147*   Cardiac Enzymes: No results for input(s): CKTOTAL, CKMB, CKMBINDEX, TROPONINI in the last 168 hours. BNP (last 3 results) No results for input(s): BNP in the last 8760 hours.  ProBNP (last 3 results) No results for input(s): PROBNP in the last 8760 hours.  CBG:  Recent Labs Lab 05/24/15 1746 05/24/15 2104  GLUCAP 128* 157*    Recent Results (from the past 240 hour(s))  MRSA PCR Screening     Status: None   Collection Time: 05/24/15  5:15 PM  Result Value Ref Range Status   MRSA by PCR NEGATIVE NEGATIVE Final    Comment:  The GeneXpert MRSA Assay (FDA approved for NASAL specimens only), is one component of a comprehensive MRSA colonization surveillance program. It is not intended to diagnose MRSA infection nor to guide or monitor treatment for MRSA infections.      Studies: Dg Chest Port 1 View  05/24/2015  CLINICAL DATA:  Shortness of breath. Dry nonproductive cough. Former smoker. EXAM: PORTABLE CHEST 1 VIEW COMPARISON:  05/01/2004 FINDINGS: The cardiac silhouette is likely within normal limits for AP technique. The lungs are well inflated without evidence of confluent airspace opacity, edema, pleural effusion, or pneumothorax.  No acute osseous abnormality is identified. IMPRESSION: No active disease. Electronically Signed   By: Sebastian AcheAllen  Grady M.D.   On: 05/24/2015 18:42    Scheduled Meds: . amLODipine  5 mg Oral Daily  . feeding supplement (ENSURE ENLIVE)  237 mL Oral BID BM  . insulin aspart  0-5 Units Subcutaneous QHS  . insulin aspart  0-9 Units Subcutaneous TID WC  . insulin glargine  20 Units Subcutaneous Daily  . metoprolol  50 mg Oral BID  . pantoprazole (PROTONIX) IV  40 mg Intravenous Q12H  . simvastatin  20 mg Oral Daily  . sodium chloride flush  3 mL Intravenous Q12H   Continuous Infusions:   Principal Problem:   Supratherapeutic INR Active Problems:   Hypertension   Hyperlipidemia   Diabetes mellitus type 2, insulin dependent (HCC)   Chronic anticoagulation   Acute kidney injury superimposed on chronic kidney disease (HCC)   Atrial fibrillation (HCC)    Time spent: 25mins    Erick BlinksMemon, Corderius Saraceni, MD  [facility] Pager [phone]. If 7PM-7AM, please contact night-coverage at [email or web address], password [email or web address] 05/25/2015, 6:59 AM  LOS: 1 day

## 2015-05-26 ENCOUNTER — Inpatient Hospital Stay (HOSPITAL_COMMUNITY): Payer: Medicare Other

## 2015-05-26 DIAGNOSIS — E119 Type 2 diabetes mellitus without complications: Secondary | ICD-10-CM

## 2015-05-26 DIAGNOSIS — Z794 Long term (current) use of insulin: Secondary | ICD-10-CM

## 2015-05-26 LAB — PREPARE FRESH FROZEN PLASMA
UNIT DIVISION: 0
Unit division: 0
Unit division: 0
Unit division: 0

## 2015-05-26 LAB — GLUCOSE, CAPILLARY
GLUCOSE-CAPILLARY: 171 mg/dL — AB (ref 65–99)
GLUCOSE-CAPILLARY: 209 mg/dL — AB (ref 65–99)
Glucose-Capillary: 166 mg/dL — ABNORMAL HIGH (ref 65–99)
Glucose-Capillary: 167 mg/dL — ABNORMAL HIGH (ref 65–99)

## 2015-05-26 LAB — BASIC METABOLIC PANEL
ANION GAP: 10 (ref 5–15)
BUN: 96 mg/dL — ABNORMAL HIGH (ref 6–20)
CHLORIDE: 118 mmol/L — AB (ref 101–111)
CO2: 18 mmol/L — AB (ref 22–32)
Calcium: 8.3 mg/dL — ABNORMAL LOW (ref 8.9–10.3)
Creatinine, Ser: 1.97 mg/dL — ABNORMAL HIGH (ref 0.61–1.24)
GFR calc Af Amer: 35 mL/min — ABNORMAL LOW (ref >60)
GFR calc non Af Amer: 31 mL/min — ABNORMAL LOW (ref >60)
GLUCOSE: 187 mg/dL — AB (ref 65–99)
POTASSIUM: 3.6 mmol/L (ref 3.5–5.1)
Sodium: 146 mmol/L — ABNORMAL HIGH (ref 135–145)

## 2015-05-26 LAB — PROTIME-INR
INR: 1.49 (ref 0.00–1.49)
PROTHROMBIN TIME: 18.1 s — AB (ref 11.6–15.2)

## 2015-05-26 LAB — CBC
HCT: 32.6 % — ABNORMAL LOW (ref 39.0–52.0)
Hemoglobin: 11 g/dL — ABNORMAL LOW (ref 13.0–17.0)
MCH: 32.5 pg (ref 26.0–34.0)
MCHC: 33.7 g/dL (ref 30.0–36.0)
MCV: 96.4 fL (ref 78.0–100.0)
Platelets: 163 10*3/uL (ref 150–400)
RBC: 3.38 MIL/uL — ABNORMAL LOW (ref 4.22–5.81)
RDW: 15 % (ref 11.5–15.5)
WBC: 8.6 10*3/uL (ref 4.0–10.5)

## 2015-05-26 MED ORDER — WARFARIN SODIUM 2 MG PO TABS
4.0000 mg | ORAL_TABLET | Freq: Once | ORAL | Status: AC
  Administered 2015-05-26: 4 mg via ORAL
  Filled 2015-05-26: qty 2

## 2015-05-26 NOTE — Progress Notes (Signed)
Pt. Received from ICU to room 336. Wife at bedside.

## 2015-05-26 NOTE — Progress Notes (Signed)
ANTICOAGULATION CONSULT NOTE   Pharmacy Consult for Coumadin Indication: atrial fibrillation  No Known Allergies  Patient Measurements: Height: 5\' 7"  (170.2 cm) Weight: 187 lb 9.8 oz (85.1 kg) IBW/kg (Calculated) : 66.1  Vital Signs: Temp: 98.2 F (36.8 C) (03/23 0400) Temp Source: Oral (03/23 0400) BP: 102/63 mmHg (03/23 0400) Pulse Rate: 78 (03/23 0600)  Labs:  Recent Labs  05/24/15 1351 05/24/15 1405 05/25/15 0447 05/26/15 0428  HGB 12.9*  --  10.9* 11.0*  HCT 37.1*  --  31.7* 32.6*  PLT 174  --  147* 163  APTT  --   --  42*  --   LABPROT  --  >90.0* 21.5* 18.1*  INR  --  >10.00* 1.88* 1.49  CREATININE  --  3.63* 2.63* 1.97*    Estimated Creatinine Clearance: 31.7 mL/min (by C-G formula based on Cr of 1.97).   Medical History: Past Medical History  Diagnosis Date  . Arteriosclerotic cardiovascular disease (ASCVD)     prior MI by echocardiography; stress nuclear -inferior scar; no prior cath  . Paroxysmal atrial fibrillation (HCC)  10/2009    onset in 10/2009; anticoagulation  . Hypertension   . Hyperlipidemia   . Diabetes mellitus type 2, insulin dependent (HCC)   . Nephrolithiasis     surgical stone extractionx2  . Chronic kidney disease     Creatinine of 1.5 in 11/03 and 1.8 in 10/08; 1.75 in 09/2009  . 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 Benign prostatic hypertrophy     s/p transurethral resection of the prostate    Medications:  See med rec  Assessment: [age] male who presented to the ED with supratherapeutic INR>10. Patient has noticed large bruises on his arms, legs, and sides. He has not taken coumadin for 2 days. He has symptoms of small amount of hemoptysis, heartburn, and mild shortness of breath. He has had difficulty controlling INR and considered eliquis but did not change due to cost.  Will hold Coumadin until in therapeutic range. Vitamin K 10mg  sq once and transfused 4 units of fresh frozen plasma.  INR  this AM is 1.49. Coumadin resumed 3/22  May take a little longer to get therapeutic with administration of Vitamin K.  Goal of Therapy:  INR 2-3 Monitor platelets by anticoagulation protocol: Yes   Plan:  Coumadin 4 mg today Daily PT/INR Monitor for S/S of bleeding  Talbert CageLora Tamara Kenyon, PharmD Clinical Pharmacist 05/26/2015,7:48 AM

## 2015-05-26 NOTE — Evaluation (Signed)
Physical Therapy Evaluation Patient Details Name: EDMAR BLANKENBURG MRN: [id number] DOB: 07-Nov-1933 Today's Date: 05/26/2015   History of Present Illness  Mr. Till is a 82 year old male with past medical history of PAF on chronic anticoagulation, HTN, HLD, DM type II, CKD stage 3; who presents at the request of his PCP Dr. Selena Batten after being found to have a supratherapeutic INR. Patient notes that he had routine blood work taken yesterday, and he had followed up with his primary care in the office this morning. He was sent back to lab core for repeat check to confirm the results. Later on this afternoon he was advised to come to the hospital to receive vitamin K as his INR was significantly high. His wife noticed that approximately 6 days ago he started to develop these large bruises on his arms, legs, and and sides. He has not taken his Coumadin for at least 2 days now, normally taking 4 mg of coumadin per day. The patient has had a difficult time with controlling his INR in the past for which they had discussed possibly switching him to Eliquis, but due to cost of this medication it was never done. He complains of associated symptoms of small amount of hemoptysis, heartburn, and mild shortness of breath Upon admission patient was evaluated and his INR was found to be greater than 10. Lab work also revealed a rise in his creatinine to 3.63 and BUN to 140. Patient's has a baseline creatinine of around 2 per review of last hospitalization in 02/2015. During that same hospitalization where patient was noted to have a supratherapeutic INR 4.81 at the time.  Pt is now c/o pain in the left ankle especially with weight bearing.  Clinical Impression  Pt was seen at bedside, wife present.  They state that pt had not been out of bed since admission until this morning when getting up to a w/c to transfer from ICU.  At that time he experienced severe pain in both lower legs/ankles/feet and he could not stand.  He is  normally fully independent with gait and all ADLs.  Upon inspection, there is no visible abnormality of the RLE and he reported that his leg felt much better after doing gentle ROM to all joints.  The dorsum of the left foot has mild visible erythema but no edema or heat.  He has active function of the LLE but is wary of moving the leg due to pain with certain movements.  He is generally weak and deconditioned and has dyspnea with minimal exertion (O2 sat=98% on RA).  Weight bearing on the LLE is very painful and all bed mobility/transfers are extremely difficult and labored.  He is currently unable to functionally ambulate.  I discussed this with Dr. Kerry Hough and he will proceed with investigation.  I am anticipating the need for HHPT and a rolling walker for use at home, but this may change with further intervention.    Follow Up Recommendations Home health PT    Equipment Recommendations  Rolling walker with 5" wheels    Recommendations for Other Services   none    Precautions / Restrictions        Mobility  Bed Mobility Overal bed mobility: Needs Assistance Bed Mobility: Supine to Sit     Supine to sit: Min assist        Transfers Overall transfer level: Needs assistance Equipment used: Rolling walker (2 wheeled) Transfers: Sit to/from [employer] Transfers Sit to Stand: Mod assist;From  elevated surface Stand pivot transfers: Mod assist Squat pivot transfers: Mod assist        Ambulation/Gait             General Gait Details: unable to walk due to severe pain in left ankle/foot especially with weight bearing  Stairs            Wheelchair Mobility    Modified Rankin (Stroke Patients Only)       Balance                                             Pertinent Vitals/Pain Pain Assessment: No/denies pain (no pain at rest)    Home Living Family/patient expects to be discharged to:: Private residence Living  Arrangements: Spouse/significant other Available Help at Discharge: Family;Available 24 hours/day Type of Home: House Home Access: Stairs to enter Entrance Stairs-Rails: Right Entrance Stairs-Number of Steps: 3 Home Layout: One level Home Equipment: Shower seat      Prior Function Level of Independence: Independent         Comments: active and independent at home     Hand Dominance        Extremity/Trunk Assessment   Upper Extremity Assessment: Generalized weakness           Lower Extremity Assessment: Generalized weakness;LLE deficits/detail   LLE Deficits / Details: mild erythema over dorsum of foot but no visible edema and no heat...has active ROM in ankle but reports pain with movement of ankle and knee     Communication   Communication: No difficulties  Cognition Arousal/Alertness: Awake/alert Behavior During Therapy: WFL for tasks assessed/performed Overall Cognitive Status: Within Functional Limits for tasks assessed                      General Comments      Exercises General Exercises - Lower Extremity Ankle Circles/Pumps: AROM;Both;10 reps;Supine Heel Slides: AAROM;Both;10 reps;Supine Hip ABduction/ADduction: AAROM;Both;10 reps;Supine Straight Leg Raises: AAROM;Both;5 reps;Supine      Assessment/Plan    PT Assessment Patient needs continued PT services  PT Diagnosis Difficulty walking;Generalized weakness;Acute pain   PT Problem List Decreased strength;Decreased activity tolerance;Decreased mobility;Decreased knowledge of use of DME;Pain  PT Treatment Interventions Gait training;Functional mobility training;Therapeutic exercise   PT Goals (Current goals can be found in the Care Plan section) Acute Rehab PT Goals Patient Stated Goal: return home to independence PT Goal Formulation: With patient/family Time For Goal Achievement: 06/09/15 Potential to Achieve Goals: Good    Frequency Min 6X/week   Barriers to discharge         Co-evaluation               End of Session Equipment Utilized During Treatment: Gait belt Activity Tolerance: Patient limited by pain Patient left: in bed;with call bell/phone within reach           Time: 1411-1516 PT Time Calculation (min) (ACUTE ONLY): 65 min   Charges:   PT Evaluation $PT Eval Moderate Complexity: 1 Procedure PT Treatments $Therapeutic Exercise: 8-22 mins $Therapeutic Activity: 8-22 mins   PT G CodesMyrlene Broker:        Haylea Schlichting L  PT 05/26/2015, 3:29 PM

## 2015-05-26 NOTE — Progress Notes (Signed)
Called report to MaitlandLeslie, Charity fundraiserN on dept 300. Verbalized understanding. Pt transferred to room 320 in safe and stable condition.

## 2015-05-26 NOTE — Progress Notes (Signed)
[facility] PROGRESS NOTE  Matthew Boyd ZOX:[id number] DOB: 12-Jul-1933 DOA: 05/24/2015 PCP: [name], MD  Assessment/Plan: 1. Supra therapeutic  INR. INR greater than 10 on admission, given  Vitamin K and 4 units of fresh plasma. Coumadin held. INR improving 1.49. He does have evidence of bruising in right flank. CT abd showed no acute findings. 2. Chronic atrial fibrillation, rate controlled. Continue metoprolol. Anticoagulation currently on hold. 3. AKI on CKD Stage III. Patient's baseline reported to be approximately 2, on admission noted to be 3.63. Improvement noted after given IVF and fresh plasma. Continue IVF and hold chlorthalidone and ACE. Continue to monitor. Creatinine now 1.97. BUN remains elevated at 96. Will check stool occult blood, although he has not had any evidence of gross bleeding. 4. GERD. Continue PPI 5. Essential HTN, Continue metoprolol. chlorthalidone and Altace held secondary to acute kidney injury. Norvasc held since blood pressures appear to be running low. 6. HLD, Continue statin. 7. Anemia, Hgb 11.0. Possibly dilutional. Will continue to follow. 8. DM type 2. Glucose 187. Continue current tx  Code Status: Full DVT prophylaxis: SCDs Family Communication: discussed with family at the bedside Disposition Plan: discharge home once improved   Consultants:  none  Procedures:  none  Antibiotics:  none  HPI/Subjective: Feels all right. Per family, he has not gotten out of bed. Denies any abd pain. Per wife, his left ankle hurts. He also complains of knee pain.   Objective: Filed Vitals:   05/25/15 2000 05/26/15 0000  BP: 113/65 101/64  Pulse: 96 91  Temp: 98.2 F (36.8 C) 98.5 F (36.9 C)  Resp: 19 21    Intake/Output Summary (Last 24 hours) at 05/26/15 0532 Last data filed at 05/25/15 2300  Gross per 24 hour  Intake    720 ml  Output   2250 ml  Net  -1530 ml   Filed Weights   05/24/15 1342 05/24/15 1749 05/25/15 0500   Weight: 85.276 kg (188 lb) 85.4 kg (188 lb 4.4 oz) 85.4 kg (188 lb 4.4 oz)    Exam:  General: NAD, looks comfortable Cardiovascular: irrgular Respiratory: clear bilaterally, No wheezing, rales or rhonchi  Abdomen: soft, non tender, no distention , bowel sounds normal  Musculoskeletal: No edema b/l Skin: bruising noted over arms bilaterally and right flank  Data Reviewed: Basic Metabolic Panel:  Recent Labs Lab 05/24/15 1405 05/25/15 0447  NA 142 147*  K 3.9 3.5  CL 114* 117*  CO2 16* 18*  GLUCOSE 173* 137*  BUN 140* 124*  CREATININE 3.63* 2.63*  CALCIUM 8.2* 8.5*   Liver Function Tests:  Recent Labs Lab 05/25/15 0447  AST 27  ALT 18  ALKPHOS 67  BILITOT 2.1*  PROT 7.0  ALBUMIN 4.0   No results for input(s): LIPASE, AMYLASE in the last 168 hours. No results for input(s): AMMONIA in the last 168 hours. CBC:  Recent Labs Lab 05/24/15 1351 05/25/15 0447  WBC 9.9 7.7  NEUTROABS 7.3  --   HGB 12.9* 10.9*  HCT 37.1* 31.7*  MCV 94.2 94.6  PLT 174 147*   Cardiac Enzymes: No results for input(s): CKTOTAL, CKMB, CKMBINDEX, TROPONINI in the last 168 hours. BNP (last 3 results) No results for input(s): BNP in the last 8760 hours.  ProBNP (last 3 results) No results for input(s): PROBNP in the last 8760 hours.  CBG:  Recent Labs Lab 05/24/15 2104 05/25/15 0747 05/25/15 1134 05/25/15 1656 05/25/15 2308  GLUCAP 157* 168* 111* 166* 181*  Recent Results (from the past 240 hour(s))  MRSA PCR Screening     Status: None   Collection Time: 05/24/15  5:15 PM  Result Value Ref Range Status   MRSA by PCR NEGATIVE NEGATIVE Final    Comment:        The GeneXpert MRSA Assay (FDA approved for NASAL specimens only), is one component of a comprehensive MRSA colonization surveillance program. It is not intended to diagnose MRSA infection nor to guide or monitor treatment for MRSA infections.      Studies: Ct Abdomen Pelvis Wo Contrast  05/25/2015   CLINICAL DATA:  Anemia. On anticoagulation. Evaluate for hemorrhage. EXAM: CT ABDOMEN AND PELVIS WITHOUT CONTRAST TECHNIQUE: Multidetector CT imaging of the abdomen and pelvis was performed following the standard protocol without IV contrast. COMPARISON:  None. FINDINGS: Lower chest:  No acute findings. Hepatobiliary: No mass visualized on this un-enhanced exam. Gallbladder is unremarkable. Pancreas: No mass or inflammatory process identified on this un-enhanced exam. Spleen: Within normal limits in size. Adrenals/Urinary Tract: No evidence of urolithiasis or hydronephrosis. Mild bilateral renal parenchymal scarring noted. No definite mass visualized on this un-enhanced exam. Urinary bladder is empty with Foley catheter in place. Stomach/Bowel: No evidence of obstruction, inflammatory process, or abnormal fluid collections. Normal appendix visualized. Vascular/Lymphatic: No pathologically enlarged lymph nodes. No evidence of abdominal aortic aneurysm. Aortic atherosclerotic plaque noted. Reproductive: No mass or other significant abnormality. Other: No evidence of hemoperitoneum or retroperitoneal hemorrhage. Musculoskeletal: Degenerative lumbar spondylosis noted. Bilateral L5 pars defects seen with grade 1 anterolisthesis at L5-S1. IMPRESSION: No evidence retroperitoneal hemorrhage, hemoperitoneum, or other acute findings. Electronically Signed   By: Myles Rosenthal M.D.   On: 05/25/2015 16:22   Dg Chest Port 1 View  05/24/2015  CLINICAL DATA:  Shortness of breath. Dry nonproductive cough. Former smoker. EXAM: PORTABLE CHEST 1 VIEW COMPARISON:  05/01/2004 FINDINGS: The cardiac silhouette is likely within normal limits for AP technique. The lungs are well inflated without evidence of confluent airspace opacity, edema, pleural effusion, or pneumothorax. No acute osseous abnormality is identified. IMPRESSION: No active disease. Electronically Signed   By: Sebastian Ache M.D.   On: 05/24/2015 18:42    Scheduled  Meds: . feeding supplement (ENSURE ENLIVE)  237 mL Oral BID BM  . insulin aspart  0-5 Units Subcutaneous QHS  . insulin aspart  0-9 Units Subcutaneous TID WC  . insulin glargine  20 Units Subcutaneous Daily  . metoprolol  50 mg Oral BID  . pantoprazole (PROTONIX) IV  40 mg Intravenous Q12H  . simvastatin  20 mg Oral Daily  . sodium chloride flush  3 mL Intravenous Q12H  . Warfarin - Pharmacist Dosing Inpatient   Does not apply q1800   Continuous Infusions: . dextrose 5 % and 0.45% NaCl 100 mL/hr at 05/25/15 2308    Principal Problem:   Supratherapeutic INR Active Problems:   Hypertension   Hyperlipidemia   Diabetes mellitus type 2, insulin dependent (HCC)   Chronic anticoagulation   Acute renal failure superimposed on stage 3 chronic kidney disease (HCC)   Atrial fibrillation (HCC)   Protein-calorie malnutrition, severe    Time spent:    Erick Blinks, MD  [facility] Pager [phone]. If 7PM-7AM, please contact night-coverage at [email or web address], password [facility] 05/26/2015, 5:32 AM  LOS: 2 days    By signing my name below, I, Adron Bene, attest that this documentation has been prepared under the direction and in the presence of Erick Blinks, MD. Electronically Signed: Adron Bene  05/26/2015 9:19am  I, Dr. Erick BlinksJehanzeb Memon, personally performed the services described in this documentaiton. All medical record entries made by the scribe were at my direction and in my presence. I have reviewed the chart and agree that the record reflects my personal performance and is accurate and complete  Erick BlinksJehanzeb Memon, MD, 05/26/2015 9:40 AM

## 2015-05-27 DIAGNOSIS — E785 Hyperlipidemia, unspecified: Secondary | ICD-10-CM

## 2015-05-27 LAB — BASIC METABOLIC PANEL
Anion gap: 11 (ref 5–15)
BUN: 73 mg/dL — AB (ref 6–20)
CHLORIDE: 114 mmol/L — AB (ref 101–111)
CO2: 19 mmol/L — ABNORMAL LOW (ref 22–32)
Calcium: 8 mg/dL — ABNORMAL LOW (ref 8.9–10.3)
Creatinine, Ser: 1.85 mg/dL — ABNORMAL HIGH (ref 0.61–1.24)
GFR calc Af Amer: 38 mL/min — ABNORMAL LOW (ref >60)
GFR calc non Af Amer: 33 mL/min — ABNORMAL LOW (ref >60)
GLUCOSE: 141 mg/dL — AB (ref 65–99)
POTASSIUM: 3.3 mmol/L — AB (ref 3.5–5.1)
SODIUM: 144 mmol/L (ref 135–145)

## 2015-05-27 LAB — GLUCOSE, CAPILLARY
GLUCOSE-CAPILLARY: 163 mg/dL — AB (ref 65–99)
GLUCOSE-CAPILLARY: 229 mg/dL — AB (ref 65–99)
GLUCOSE-CAPILLARY: 326 mg/dL — AB (ref 65–99)
GLUCOSE-CAPILLARY: 209 mg/dL — AB (ref 65–99)

## 2015-05-27 LAB — PROTIME-INR
INR: 1.37 (ref 0.00–1.49)
Prothrombin Time: 17 seconds — ABNORMAL HIGH (ref 11.6–15.2)

## 2015-05-27 LAB — URIC ACID: Uric Acid, Serum: 10.9 mg/dL — ABNORMAL HIGH (ref 4.4–7.6)

## 2015-05-27 MED ORDER — TAMSULOSIN HCL 0.4 MG PO CAPS
0.4000 mg | ORAL_CAPSULE | Freq: Every day | ORAL | Status: DC
  Administered 2015-05-27 – 2015-05-29 (×3): 0.4 mg via ORAL
  Filled 2015-05-27 (×4): qty 1

## 2015-05-27 MED ORDER — POTASSIUM CHLORIDE CRYS ER 20 MEQ PO TBCR
40.0000 meq | EXTENDED_RELEASE_TABLET | Freq: Once | ORAL | Status: AC
  Administered 2015-05-27: 40 meq via ORAL
  Filled 2015-05-27: qty 2

## 2015-05-27 MED ORDER — APIXABAN 5 MG PO TABS
2.5000 mg | ORAL_TABLET | Freq: Two times a day (BID) | ORAL | Status: DC
  Administered 2015-05-27 – 2015-05-30 (×7): 2.5 mg via ORAL
  Filled 2015-05-27 (×7): qty 1

## 2015-05-27 MED ORDER — PANTOPRAZOLE SODIUM 40 MG PO TBEC
40.0000 mg | DELAYED_RELEASE_TABLET | Freq: Two times a day (BID) | ORAL | Status: DC
  Administered 2015-05-27 – 2015-05-30 (×6): 40 mg via ORAL
  Filled 2015-05-27 (×6): qty 1

## 2015-05-27 MED ORDER — POLYETHYLENE GLYCOL 3350 17 G PO PACK
17.0000 g | PACK | Freq: Every day | ORAL | Status: DC
  Administered 2015-05-27 – 2015-05-30 (×4): 17 g via ORAL
  Filled 2015-05-27 (×4): qty 1

## 2015-05-27 MED ORDER — METHYLPREDNISOLONE SODIUM SUCC 125 MG IJ SOLR
60.0000 mg | Freq: Every day | INTRAMUSCULAR | Status: DC
  Administered 2015-05-27 – 2015-05-28 (×2): 60 mg via INTRAVENOUS
  Filled 2015-05-27 (×2): qty 2

## 2015-05-27 MED ORDER — BISACODYL 5 MG PO TBEC
10.0000 mg | DELAYED_RELEASE_TABLET | Freq: Once | ORAL | Status: AC
  Administered 2015-05-27: 10 mg via ORAL
  Filled 2015-05-27: qty 2

## 2015-05-27 NOTE — Progress Notes (Signed)
Late Entry for today:  MD was notified of bladder scan results of >200 cc left in the bladder post voiding of 100cc.  No new orders at this time.    Also notified him at this time of the patients Uric acid being 10.9.

## 2015-05-27 NOTE — Progress Notes (Signed)
[facility] PROGRESS NOTE  Matthew Boyd WUJ:[id number] DOB: November 07, 1933 DOA: 05/24/2015 PCP: [name], MD  Assessment/Plan: 1. Supra therapeutic  INR. INR greater than 10 on admission, given  Vitamin K and 4 units of fresh plasma. Coumadin held. INR improving 1.37. He does have evidence of bruising in right flank. CT abd showed no acute findings. 2. Chronic atrial fibrillation, rate controlled. Continue metoprolol. Will discontinue Warfarin in favor of Eliquis, since family reports difficulty in maintaining therapeutic levels of coumadin.  Anticoagulation currently on hold. 3. AKI on CKD Stage III. Patient's baseline reported to be approximately 2, on admission noted to be 3.63. Improvement noted after given IVF and fresh plasma. Continue IVF and hold chlorthalidone and ACE. Continue to monitor. Creatinine now 1.85. BUN remains elevated at 73. Follow up stool occult blood, although he has not had any evidence of gross bleeding. 4. Pain in right lower extremity. Possibly gout. Will start on steroids and will need to utilize a walker. Will check uric acid.  5. GERD. Continue PPI 6. Essential HTN, Continue metoprolol. Chlorthalidone and Altace held secondary to acute kidney injury. Norvasc held since blood pressures appear to be running low. Improving 7. HLD, Continue statin. 8. Anemia, Hgb 11.0. Possibly dilutional. Will continue to follow. 9. DM type 2. Glucose 163. Continue current tx  Code Status: Full DVT prophylaxis: SCDs Family Communication: discussed with family at the bedside  Disposition Plan: anticipate discharge in 24 hours.   Consultants:  none  Procedures:  none  Antibiotics:  none  HPI/Subjective: Per family, around 4:15 am there was an incident where he tried to go to the bathroom but had difficulty due to foot. Foot still hurts. Breathing is doing all right sometimes, but has difficulty other times. He has been able to go to the bathroom with a walker.  He has been having difficulty with his bowels. His urination has been fine.   Objective: Filed Vitals:   05/26/15 1103 05/27/15 0243  BP: 105/67 119/61  Pulse: 91 89  Temp: 97.7 F (36.5 C) 97.4 F (36.3 C)  Resp: 20 18    Intake/Output Summary (Last 24 hours) at 05/27/15 0610 Last data filed at 05/26/15 2036  Gross per 24 hour  Intake    480 ml  Output   1550 ml  Net  -1070 ml   Filed Weights   05/24/15 1749 05/25/15 0500 05/26/15 0600  Weight: 85.4 kg (188 lb 4.4 oz) 85.4 kg (188 lb 4.4 oz) 85.1 kg (187 lb 9.8 oz)    Exam:  General: NAD, looks comfortable Cardiovascular: irrgular Respiratory: clear bilaterally, No wheezing, rales or rhonchi  Abdomen: soft, non tender, no distention , bowel sounds normal Musculoskeletal: No edema b/l.  Erythema and tenderness on dorsal aspect of right foot. Skin: bruising noted over arms bilaterally and right flank  Data Reviewed: Basic Metabolic Panel:  Recent Labs Lab 05/24/15 1405 05/25/15 0447 05/26/15 0428  NA 142 147* 146*  K 3.9 3.5 3.6  CL 114* 117* 118*  CO2 16* 18* 18*  GLUCOSE 173* 137* 187*  BUN 140* 124* 96*  CREATININE 3.63* 2.63* 1.97*  CALCIUM 8.2* 8.5* 8.3*   Liver Function Tests:  Recent Labs Lab 05/25/15 0447  AST 27  ALT 18  ALKPHOS 67  BILITOT 2.1*  PROT 7.0  ALBUMIN 4.0   No results for input(s): LIPASE, AMYLASE in the last 168 hours. No results for input(s): AMMONIA in the last 168 hours. CBC:  Recent Labs Lab 05/24/15 1351  05/25/15 0447 05/26/15 0428  WBC 9.9 7.7 8.6  NEUTROABS 7.3  --   --   HGB 12.9* 10.9* 11.0*  HCT 37.1* 31.7* 32.6*  MCV 94.2 94.6 96.4  PLT 174 147* 163   Cardiac Enzymes: No results for input(s): CKTOTAL, CKMB, CKMBINDEX, TROPONINI in the last 168 hours. BNP (last 3 results) No results for input(s): BNP in the last 8760 hours.  ProBNP (last 3 results) No results for input(s): PROBNP in the last 8760 hours.  CBG:  Recent Labs Lab 05/25/15 2308  05/26/15 0736 05/26/15 1115 05/26/15 1622 05/26/15 2014  GLUCAP 181* 166* 171* 209* 167*    Recent Results (from the past 240 hour(s))  MRSA PCR Screening     Status: None   Collection Time: 05/24/15  5:15 PM  Result Value Ref Range Status   MRSA by PCR NEGATIVE NEGATIVE Final    Comment:        The GeneXpert MRSA Assay (FDA approved for NASAL specimens only), is one component of a comprehensive MRSA colonization surveillance program. It is not intended to diagnose MRSA infection nor to guide or monitor treatment for MRSA infections.      Studies: Ct Abdomen Pelvis Wo Contrast  05/25/2015  CLINICAL DATA:  Anemia. On anticoagulation. Evaluate for hemorrhage. EXAM: CT ABDOMEN AND PELVIS WITHOUT CONTRAST TECHNIQUE: Multidetector CT imaging of the abdomen and pelvis was performed following the standard protocol without IV contrast. COMPARISON:  None. FINDINGS: Lower chest:  No acute findings. Hepatobiliary: No mass visualized on this un-enhanced exam. Gallbladder is unremarkable. Pancreas: No mass or inflammatory process identified on this un-enhanced exam. Spleen: Within normal limits in size. Adrenals/Urinary Tract: No evidence of urolithiasis or hydronephrosis. Mild bilateral renal parenchymal scarring noted. No definite mass visualized on this un-enhanced exam. Urinary bladder is empty with Foley catheter in place. Stomach/Bowel: No evidence of obstruction, inflammatory process, or abnormal fluid collections. Normal appendix visualized. Vascular/Lymphatic: No pathologically enlarged lymph nodes. No evidence of abdominal aortic aneurysm. Aortic atherosclerotic plaque noted. Reproductive: No mass or other significant abnormality. Other: No evidence of hemoperitoneum or retroperitoneal hemorrhage. Musculoskeletal: Degenerative lumbar spondylosis noted. Bilateral L5 pars defects seen with grade 1 anterolisthesis at L5-S1. IMPRESSION: No evidence retroperitoneal hemorrhage, hemoperitoneum, or  other acute findings. Electronically Signed   By: Myles Rosenthal M.D.   On: 05/25/2015 16:22   Dg Foot Complete Left  05/26/2015  CLINICAL DATA:  Left foot pain 1 day. Redness and swelling dorsal surface at level of metatarsals. No injury. Diabetes. EXAM: LEFT FOOT - COMPLETE 3+ VIEW COMPARISON:  None. FINDINGS: There are mild degenerative changes over the interphalangeal joints. There are degenerative changes over the mid and hindfoot region. Spurring over the posterior and inferior calcaneus. No acute fracture or dislocation. Small vessel atherosclerotic disease is present. IMPRESSION: No acute findings. Degenerative changes as described. Electronically Signed   By: Elberta Fortis M.D.   On: 05/26/2015 17:18    Scheduled Meds: . feeding supplement (ENSURE ENLIVE)  237 mL Oral BID BM  . insulin aspart  0-5 Units Subcutaneous QHS  . insulin aspart  0-9 Units Subcutaneous TID WC  . insulin glargine  20 Units Subcutaneous Daily  . metoprolol  50 mg Oral BID  . pantoprazole (PROTONIX) IV  40 mg Intravenous Q12H  . simvastatin  20 mg Oral Daily  . sodium chloride flush  3 mL Intravenous Q12H  . Warfarin - Pharmacist Dosing Inpatient   Does not apply q1800   Continuous Infusions: . dextrose 5 %  and 0.45% NaCl 100 mL/hr at 05/27/15 [id number]    Principal Problem:   Supratherapeutic INR Active Problems:   Hypertension   Hyperlipidemia   Diabetes mellitus type 2, insulin dependent (HCC)   Chronic anticoagulation   Acute renal failure superimposed on stage 3 chronic kidney disease (HCC)   Atrial fibrillation (HCC)   Protein-calorie malnutrition, severe    Time spent: 25mins    Erick BlinksMemon, Gweneth Fredlund, MD  [facility] Pager [phone]. If 7PM-7AM, please contact night-coverage at [email or web address], password [email or web address] 05/27/2015, 6:10 AM  LOS: 3 days    By signing my name below, I, Adron BeneGreylon Gawaluck, attest that this documentation has been prepared under the direction and in the presence of Erick BlinksJehanzeb Anthonymichael Munday,  MD. Electronically Signed: Adron BeneGreylon Gawaluck 05/27/2015  10:40am  I, Dr. Erick BlinksJehanzeb Jalina Blowers, personally performed the services described in this documentaiton. All medical record entries made by the scribe were at my direction and in my presence. I have reviewed the chart and agree that the record reflects my personal performance and is accurate and complete  Erick BlinksJehanzeb Aleta Manternach, MD, 05/27/2015 11:05 AM

## 2015-05-27 NOTE — Progress Notes (Signed)
Physical Therapy Treatment Patient Details Name: Matthew Boyd MRN: [id number] DOB: [birth year] Today's Date: 05/27/2015    History of Present Illness [name] is a 82 year old male with past medical history of PAF on chronic anticoagulation, HTN, HLD, DM type II, CKD stage 3; who presents at the request of his PCP Dr. Selena BattenKim after being found to have a supratherapeutic INR. Patient notes that he had routine blood work taken yesterday, and he had followed up with his primary care in the office this morning. He was sent back to lab core for repeat check to confirm the results. Later on this afternoon he was advised to come to the hospital to receive vitamin K as his INR was significantly high. His wife noticed that approximately 6 days ago he started to develop these large bruises on his arms, legs, and and sides. He has not taken his Coumadin for at least 2 days now, normally taking 4 mg of coumadin per day. The patient has had a difficult time with controlling his INR in the past for which they had discussed possibly switching him to Eliquis, but due to cost of this medication it was never done. He complains of associated symptoms of small amount of hemoptysis, heartburn, and mild shortness of breath Upon admission patient was evaluated and his INR was found to be greater than 10. Lab work also revealed a rise in his creatinine to 3.63 and BUN to 140. Patient's has a baseline creatinine of around 2 per review of last hospitalization in 02/2015. During that same hospitalization where patient was noted to have a supratherapeutic INR 4.81 at the time.    PT Comments    Pt tolerating treatment session somewhat well, motivated and able to complete entire PT sesssion as planned, but presenting with elevated HR in 120's-130's in afib while attempting transfers. Pt continues to make minimal progress toward goals as evidenced by continued high level of assistance needed for bed mobility, transfers, and  inability to ambulate. Pt does endorse improved pain response with OOB compared to yesterday. Pt's greatest limitation continues to be gross weakness and deconditioning, which continues to limit ability to perform all ADL and simple mobility at baseline function. Patient presenting with impairment of strength, pain, range of motion, balance, and activity tolerance, limiting ability to perform ADL and mobility tasks at  baseline level of function. Patient will benefit from skilled intervention to address the above impairments and limitations, in order to restore to prior level of function, improve patient safety upon discharge, and to decrease caregiver burden. I have updated DC rec to STR due to continued high level of assistance needed and limited ability of wife to provide physical assistance.    Follow Up Recommendations  SNF     Equipment Recommendations  Rolling walker with 5" wheels    Recommendations for Other Services       Precautions / Restrictions Precautions Precautions: Fall    Mobility  Bed Mobility Overal bed mobility: Needs Assistance Bed Mobility: Supine to Sit;Sit to Supine     Supine to sit: Mod assist Sit to supine: +2 for physical assistance;Max assist      Transfers Overall transfer level: Needs assistance Equipment used: Rolling walker (2 wheeled) Transfers: Sit to/from Stand Sit to Stand: From elevated surface;Total assist;+2 physical assistance         General transfer comment: does not follow verbacl cues for posturing very well. Feet too far forward, poor forward lean when cued, with high level falls anxiety  c forward flexion.   Ambulation/Gait                 Stairs            Wheelchair Mobility    Modified Rankin (Stroke Patients Only)       Balance Overall balance assessment: Needs assistance Sitting-balance support: Bilateral upper extremity supported;Feet supported Sitting balance-Leahy Scale: Fair     Standing  balance support: Bilateral upper extremity supported;During functional activity Standing balance-Leahy Scale: Zero                      Cognition Arousal/Alertness: Awake/alert Behavior During Therapy: WFL for tasks assessed/performed Overall Cognitive Status: Within Functional Limits for tasks assessed                      Exercises      General Comments        Pertinent Vitals/Pain Pain Assessment: 0-10 Pain Score: 7  Pain Location: Foot pain Pain Intervention(s): Limited activity within patient's tolerance;Monitored during session;Premedicated before session    Home Living                      Prior Function            PT Goals (current goals can now be found in the care plan section) Acute Rehab PT Goals Patient Stated Goal: return home to [place] PT Goal Formulation: With patient/family Time For Goal Achievement: 06/09/15 Potential to Achieve Goals: Good Progress towards PT goals: Not progressing toward goals - comment    Frequency  Min 6X/week    PT Plan Discharge plan needs to be updated    Co-evaluation             End of Session Equipment Utilized During Treatment: Gait belt Activity Tolerance: Patient limited by fatigue;Treatment limited secondary to medical complications (Comment);Patient limited by pain Patient left: in bed;with family/visitor present;with call bell/phone within reach;with bed alarm set     Time: 1610-9604 PT Time Calculation (min) (ACUTE ONLY): 15 min  Charges:  $Therapeutic Activity: 8-22 mins                    G Codes:      4:05 PM, 06/14/15 Rosamaria Lints, PT, DPT PRN Physical Therapist at [facility] [place] License # [id number] [phone] (wireless)  [phone] (mobile)

## 2015-05-27 NOTE — Progress Notes (Addendum)
ANTICOAGULATION CONSULT NOTE - Initial Consult  [facility]: atrial fibrillation  No Known Allergies  Patient Measurements: Height: 5\' 7"  (170.2 cm) Weight: 187 lb 9.8 oz (85.1 kg) IBW/kg (Calculated) : 66.1  Vital Signs: Temp: 97.4 F (36.3 C) (03/24 0243) Temp Source: Oral (03/24 0243) BP: 119/61 mmHg (03/24 0243) Pulse Rate: 89 (03/24 0243)  Labs:  Recent Labs  05/24/15 1351  05/25/15 0447 05/26/15 0428 05/27/15 0430  HGB 12.9*  --  10.9* 11.0*  --   HCT 37.1*  --  31.7* 32.6*  --   PLT 174  --  147* 163  --   APTT  --   --  42*  --   --   LABPROT  --   < > 21.5* 18.1* 17.0*  INR  --   < > 1.88* 1.49 1.37  CREATININE  --   < > 2.63* 1.97* 1.85*  < > = values in this interval not displayed.  Estimated Creatinine Clearance: 33.8 mL/min (by C-G formula based on Cr of 1.85).  Medical History: Past Medical History  Diagnosis Date  . Arteriosclerotic cardiovascular disease (ASCVD)     prior MI by echocardiography; stress nuclear -inferior scar; no prior cath  . Paroxysmal atrial fibrillation (HCC)  10/2009    onset in 10/2009; anticoagulation  . Hypertension   . Hyperlipidemia   . Diabetes mellitus type 2, insulin dependent (HCC)   . Nephrolithiasis     surgical stone extractionx2  . Chronic kidney disease     Creatinine of 1.5 in 11/03 and 1.8 in 10/08; 1.75 in 09/2009  . 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 Benign prostatic hypertrophy     s/p transurethral resection of the prostate   Assessment: 82yo male (will be 82yo in June).  Elevated SCr > 1.5.  Pt was on Coumadin previously with elevated INR on admission.  INR was reversed with Vitamin K and FFP.  INR is 1.37 today.  Pt with h/o CAF.  Per MD, plan to switch to Eliquis since family reports difficulty maintaining therapeutic INR on Coumadin.  Goal of Therapy:  Anticoagulation with ELIQUIS Monitor platelets by anticoagulation protocol: Yes    Plan:  Eliquis 2.5mg  PO BID (adjusted due to age and SCr > 1.5) Monitor for s/sx of bleeding complications Provide education  The patient is receiving PROTONIX by the intravenous route.  Based on criteria approved by the Pharmacy and Therapeutics Committee and the Medical Executive Committee, the medication is being converted to the equivalent oral dose form.  These criteria include: -No Active GI bleeding -Able to tolerate diet of full liquids (or better) or tube feeding OR able to tolerate other medications by the oral or enteral route  If you have any questions about this conversion, please contact the Pharmacy Department (ext [phone]).  Thank you.  Matthew Boyd, Depaul Arizpe A, [facility]PH 05/27/2015 11:30 AM

## 2015-05-27 NOTE — Discharge Instructions (Addendum)

## 2015-05-27 NOTE — Care Management Note (Signed)
Case Management Note  Patient Details  Name: Matthew Boyd MRN: [id number] Date of Birth: 12-26-1933  Subjective/Objective:                  Pt is from home, lives with his wife and is ind at baseline. Pt has no HH services or DME prior to admission. Pt previously on coumadin but is now considering Eliquis. PT has recommended HH PT services, walker and BSC at DC and [facility], would prefer to use them again. Pt's wife understands HH has 48 hours to initiate services at DC.   Action/Plan: Anticipate DC home over weekend. Benefit check reveals approx $45 monthly co-pay for Eliquis, pt's wife is agreeable to changing and voucher for 30-day free given. Alroy BailiffLinda Lothian, of [facility], made aware of [facility]ME and [facility] and will obtain pt info from chart and have DME delivered to pt room prior to DC. Discharging RN will [facility] aware of when pt DC's over weekend.   Expected Discharge Date:     05/28/2015             Expected Discharge Plan:  Home w Home Health Services  In-House Referral:  NA  Discharge planning Services  CM Consult  Post Acute Care Choice:  Durable Medical Equipment, Home Health Choice offered to:  Patient, Spouse  DME Arranged:  Bedside commode, Walker rolling DME Agency:  [facility]  HH Arranged:  [facility], [facility]  [facility]  Status of Service:  Completed, signed off  Medicare Important Message Given:  Yes Date Medicare IM Given:    Medicare IM give by:    Date Additional Medicare IM Given:    Additional Medicare Important Message give by:     If discussed at Long Length of Stay Meetings, dates discussed:    Additional Comments:  Malcolm MetroChildress, Emmerson Taddei Demske, RN 05/27/2015, 2:21 PM

## 2015-05-27 NOTE — Care Management Important Message (Signed)
Important Message  Patient Details  Name: Matthew Boyd MRN: [id number] Date of Birth: 1934-01-08   Medicare Important Message Given:  Yes    Malcolm MetroChildress, Alfredia Desanctis Demske, RN 05/27/2015, 2:19 PM

## 2015-05-28 LAB — BASIC METABOLIC PANEL
Anion gap: 8 (ref 5–15)
BUN: 63 mg/dL — AB (ref 6–20)
CALCIUM: 7.8 mg/dL — AB (ref 8.9–10.3)
CO2: 19 mmol/L — AB (ref 22–32)
CREATININE: 1.89 mg/dL — AB (ref 0.61–1.24)
Chloride: 114 mmol/L — ABNORMAL HIGH (ref 101–111)
GFR calc Af Amer: 37 mL/min — ABNORMAL LOW (ref >60)
GFR, EST NON AFRICAN AMERICAN: 32 mL/min — AB (ref >60)
GLUCOSE: 247 mg/dL — AB (ref 65–99)
Potassium: 4.2 mmol/L (ref 3.5–5.1)
Sodium: 141 mmol/L (ref 135–145)

## 2015-05-28 LAB — GLUCOSE, CAPILLARY
Glucose-Capillary: 214 mg/dL — ABNORMAL HIGH (ref 65–99)
Glucose-Capillary: 195 mg/dL — ABNORMAL HIGH (ref 65–99)
Glucose-Capillary: 268 mg/dL — ABNORMAL HIGH (ref 65–99)
Glucose-Capillary: 173 mg/dL — ABNORMAL HIGH (ref 65–99)

## 2015-05-28 MED ORDER — INSULIN ASPART 100 UNIT/ML ~~LOC~~ SOLN: 0.0000 [IU] | Freq: Every day | SUBCUTANEOUS | Status: DC

## 2015-05-28 MED ORDER — INSULIN ASPART 100 UNIT/ML ~~LOC~~ SOLN
0.0000 [IU] | Freq: Three times a day (TID) | SUBCUTANEOUS | Status: DC
  Administered 2015-05-28: 11 [IU] via SUBCUTANEOUS
  Administered 2015-05-28: 5 [IU] via SUBCUTANEOUS
  Administered 2015-05-29 (×2): 7 [IU] via SUBCUTANEOUS
  Administered 2015-05-29 – 2015-05-30 (×3): 3 [IU] via SUBCUTANEOUS

## 2015-05-28 MED ORDER — PREDNISONE 20 MG PO TABS
40.0000 mg | ORAL_TABLET | Freq: Every day | ORAL | Status: DC
  Administered 2015-05-29 – 2015-05-30 (×2): 40 mg via ORAL
  Filled 2015-05-28 (×2): qty 2

## 2015-05-28 NOTE — Progress Notes (Signed)
[facility] PROGRESS NOTE  Matthew Boyd ZOX:[id number] DOB: [birth year] DOA: 05/24/2015 PCP: Pearson GrippeJames Kim, MD  Assessment/Plan: 1. Supra therapeutic  INR. INR greater than 10 on admission, given 10mg  Vitamin K and 4 units of fresh plasma. INR has since been reversed. CT abd did not show any evidence of internal hemorrhage. 2. Chronic atrial fibrillation, rate controlled. Continue metoprolol. Continue Eliquis. Family reports difficulty in maintaining therapeutic levels of coumadin.   3. AKI on CKD Stage III. Patient's baseline reported to be approximately 2, on admission noted to be 3.63. Improvement noted after given IVF and fresh plasma. Continue to hold chlorthalidone and ACE. Continue to monitor. Creatinine appears to be near baseline. Follow up stool occult blood, although he has not had any evidence of gross bleeding. 4. GERD. Continue PPI 5. Essential HTN, Continue metoprolol. Chlorthalidone and Altace held secondary to acute kidney injury. Norvasc held since blood pressures appear to be running low. Improved 6. HLD, Continue statin. 7. Anemia. Possibly dilutional. Will continue to follow. 8. DM type 2. Blood sugars are higher due to steroids. Change sliding scale to resistant. Continue basal insulin 9. Acute gout attack. Uric acid noted to be elevated. Started on solumedrol with some improvement. Continue on prednisone  Code Status: Full DVT prophylaxis:  Eliquis Family Communication: discussed with family at the bedside  Disposition Plan: discharge to SNF once bed available   Consultants:  none  Procedures:  none  Antibiotics:  none  HPI/Subjective: Pain in left foot is improving. No shortness of breath  Objective: Filed Vitals:   05/27/15 2223 05/28/15 0601  BP: 90/45 108/78  Pulse: 102 104  Temp: 99.1 F (37.3 C) 98.1 F (36.7 C)  Resp: 18 18    Intake/Output Summary (Last 24 hours) at 05/28/15 0658 Last data filed at 05/27/15 1730  Gross per 24 hour   Intake    720 ml  Output    500 ml  Net    220 ml   Filed Weights   05/24/15 1749 05/25/15 0500 05/26/15 0600  Weight: 85.4 kg (188 lb 4.4 oz) 85.4 kg (188 lb 4.4 oz) 85.1 kg (187 lb 9.8 oz)    Exam:  General: NAD, looks comfortable Cardiovascular: irregular, S1, S2  Respiratory: clear bilaterally, No wheezing, rales or rhonchi Abdomen: soft, non tender, no distention , bowel sounds normal Musculoskeletal: No edema b/l, erythema over dorsal aspect of left foot is improving. Tenderness also improving. Skin: bruising noted over arms bilaterally and right flank  Data Reviewed: Basic Metabolic Panel:  Recent Labs Lab 05/24/15 1405 05/25/15 0447 05/26/15 0428 05/27/15 0430  NA 142 147* 146* 144  K 3.9 3.5 3.6 3.3*  CL 114* 117* 118* 114*  CO2 16* 18* 18* 19*  GLUCOSE 173* 137* 187* 141*  BUN 140* 124* 96* 73*  CREATININE 3.63* 2.63* 1.97* 1.85*  CALCIUM 8.2* 8.5* 8.3* 8.0*   Liver Function Tests:  Recent Labs Lab 05/25/15 0447  AST 27  ALT 18  ALKPHOS 67  BILITOT 2.1*  PROT 7.0  ALBUMIN 4.0   No results for input(s): LIPASE, AMYLASE in the last 168 hours. No results for input(s): AMMONIA in the last 168 hours. CBC:  Recent Labs Lab 05/24/15 1351 05/25/15 0447 05/26/15 0428  WBC 9.9 7.7 8.6  NEUTROABS 7.3  --   --   HGB 12.9* 10.9* 11.0*  HCT 37.1* 31.7* 32.6*  MCV 94.2 94.6 96.4  PLT 174 147* 163   Cardiac Enzymes: No results for input(s): CKTOTAL, CKMB,  CKMBINDEX, TROPONINI in the last 168 hours. BNP (last 3 results) No results for input(s): BNP in the last 8760 hours.  ProBNP (last 3 results) No results for input(s): PROBNP in the last 8760 hours.  CBG:  Recent Labs Lab 05/26/15 2014 05/27/15 0840 05/27/15 1212 05/27/15 1712 05/27/15 2106  GLUCAP 167* 163* 229* 326* 209*    Recent Results (from the past 240 hour(s))  MRSA PCR Screening     Status: None   Collection Time: 05/24/15  5:15 PM  Result Value Ref Range Status   MRSA by  PCR NEGATIVE NEGATIVE Final    Comment:        The GeneXpert MRSA Assay (FDA approved for NASAL specimens only), is one component of a comprehensive MRSA colonization surveillance program. It is not intended to diagnose MRSA infection nor to guide or monitor treatment for MRSA infections.      Studies: Dg Foot Complete Left  05/26/2015  CLINICAL DATA:  Left foot pain 1 day. Redness and swelling dorsal surface at level of metatarsals. No injury. Diabetes. EXAM: LEFT FOOT - COMPLETE 3+ VIEW COMPARISON:  None. FINDINGS: There are mild degenerative changes over the interphalangeal joints. There are degenerative changes over the mid and hindfoot region. Spurring over the posterior and inferior calcaneus. No acute fracture or dislocation. Small vessel atherosclerotic disease is present. IMPRESSION: No acute findings. Degenerative changes as described. Electronically Signed   By: Elberta Fortis M.D.   On: 05/26/2015 17:18    Scheduled Meds: . apixaban  2.5 mg Oral BID  . feeding supplement (ENSURE ENLIVE)  237 mL Oral BID BM  . insulin aspart  0-5 Units Subcutaneous QHS  . insulin aspart  0-9 Units Subcutaneous TID WC  . insulin glargine  20 Units Subcutaneous Daily  . methylPREDNISolone (SOLU-MEDROL) injection  60 mg Intravenous Daily  . metoprolol  50 mg Oral BID  . pantoprazole  40 mg Oral BID  . polyethylene glycol  17 g Oral Daily  . simvastatin  20 mg Oral Daily  . sodium chloride flush  3 mL Intravenous Q12H  . tamsulosin  0.4 mg Oral QPC supper   Continuous Infusions: . dextrose 5 % and 0.45% NaCl 100 mL/hr at 05/27/15 2339    Principal Problem:   Supratherapeutic INR Active Problems:   Hypertension   Hyperlipidemia   Diabetes mellitus type 2, insulin dependent (HCC)   Chronic anticoagulation   Acute renal failure superimposed on stage 3 chronic kidney disease (HCC)   Atrial fibrillation (HCC)   Protein-calorie malnutrition, severe    Time spent:      Erick Blinks, MD  [facility] Pager [phone]. If 7PM-7AM, please contact night-coverage at [email or web address], password [email or web address] 05/28/2015, 6:58 AM  LOS: 4 days

## 2015-05-28 NOTE — Progress Notes (Signed)
Physical Therapy Treatment Patient Details Name: Matthew Boyd MRN: [id number] DOB: [birth year] Today's Date: 05/28/2015    History of Present Illness Mr. Tuggle is a 82 year old male with past medical history of PAF on chronic anticoagulation, HTN, HLD, DM type II, CKD stage 3; who presents at the request of his PCP Dr. Selena Batten after being found to have a supratherapeutic INR. Patient notes that he had routine blood work taken yesterday, and he had followed up with his primary care in the office this morning. He was sent back to lab core for repeat check to confirm the results. Later on this afternoon he was advised to come to the hospital to receive vitamin K as his INR was significantly high. His wife noticed that approximately 6 days ago he started to develop these large bruises on his arms, legs, and and sides. He has not taken his Coumadin for at least 2 days now, normally taking 4 mg of coumadin per day. The patient has had a difficult time with controlling his INR in the past for which they had discussed possibly switching him to Eliquis, but due to cost of this medication it was never done. He complains of associated symptoms of small amount of hemoptysis, heartburn, and mild shortness of breath Upon admission patient was evaluated and his INR was found to be greater than 10. Lab work also revealed a rise in his creatinine to 3.63 and BUN to 140. Patient's has a baseline creatinine of around 2 per review of last hospitalization in 02/2015. During that same hospitalization where patient was noted to have a supratherapeutic INR 4.81 at the time.    PT Comments    Pt showing some progress since yesterday's session, with significantly reduced pain, improved HR response to exertional activity, and ability to attempt some ambulation.t remains very weak with poor endurance, and requires +2 for ambulation and pivot transfers. Maintaining recommendations for DC to STR.   Follow Up Recommendations  SNF      Equipment Recommendations  None recommended by PT    Recommendations for Other Services       Precautions / Restrictions Precautions Precautions: Fall Restrictions Weight Bearing Restrictions: No    Mobility  Bed Mobility Overal bed mobility: Needs Assistance Bed Mobility: Supine to Sit     Supine to sit: Mod assist        Transfers Overall transfer level: Needs assistance Equipment used: 1 person hand held assist Transfers: Sit to/from Stand Sit to Stand: Max assist Stand pivot transfers: Max assist       General transfer comment: Improved indep with proper foot placement, but heavy verbal cues required, with little evidence of learning. Pt requires cues throughout all 10 attempts, remains very fearful of falling, balance limited, and difficulty achieveing terminal knee extension.   Ambulation/Gait Ambulation/Gait assistance: Mod assist Ambulation Distance (Feet): 6 Feet Assistive device: Rolling walker (2 wheeled)     Gait velocity interpretation: <1.8 ft/sec, indicative of risk for recurrent falls General Gait Details: Very heavily dependent on RW for support, with poor endurance, becomes very unstable at the end of 71ftr AMB; 3 point gait, requires heavy verbal cues.    Stairs            Wheelchair Mobility    Modified Rankin (Stroke Patients Only)       Balance Overall balance assessment: Needs assistance   Sitting balance-Leahy Scale: Fair       Standing balance-Leahy Scale: Zero  Cognition Arousal/Alertness: Awake/alert Behavior During Therapy: WFL for tasks assessed/performed Overall Cognitive Status: Within Functional Limits for tasks assessed                      Exercises      General Comments        Pertinent Vitals/Pain Pain Assessment: 0-10 Pain Score: 2  Pain Location: Left Foot  Pain Intervention(s): Monitored during session    Home Living                       Prior Function            PT Goals (current goals can now be found in the care plan section) Acute Rehab PT Goals Patient Stated Goal: return home to independence PT Goal Formulation: With patient/family Time For Goal Achievement: 06/09/15 Potential to Achieve Goals: Good Progress towards PT goals: Progressing toward goals    Frequency  Min 6X/week    PT Plan Current plan remains appropriate    Co-evaluation             End of Session Equipment Utilized During Treatment: Gait belt Activity Tolerance: Patient limited by fatigue;Treatment limited secondary to medical complications (Comment);Patient limited by pain Patient left: in bed;with family/visitor present;with call bell/phone within reach;with bed alarm set     Time: [id number] PT Time Calculation (min) (ACUTE ONLY): 19 min  Charges:  $Therapeutic Activity: 8-22 mins                    G Codes:      12:37 PM, 05/28/2015 Rosamaria LintsAllan C Corine Solorio, PT, DPT PRN Physical Therapist [facility] License # [id number] [phone] (wireless)  [phone] (mobile)

## 2015-05-29 LAB — GLUCOSE, CAPILLARY
GLUCOSE-CAPILLARY: 207 mg/dL — AB (ref 65–99)
Glucose-Capillary: 136 mg/dL — ABNORMAL HIGH (ref 65–99)
Glucose-Capillary: 235 mg/dL — ABNORMAL HIGH (ref 65–99)
Glucose-Capillary: 157 mg/dL — ABNORMAL HIGH (ref 65–99)

## 2015-05-29 MED ORDER — GABAPENTIN 100 MG PO CAPS
100.0000 mg | ORAL_CAPSULE | Freq: Three times a day (TID) | ORAL | Status: DC
  Administered 2015-05-29 – 2015-05-30 (×4): 100 mg via ORAL
  Filled 2015-05-29 (×4): qty 1

## 2015-05-29 NOTE — Progress Notes (Signed)
Meds not given at scheduled time, family present and leaving, waited to give medications.

## 2015-05-29 NOTE — Progress Notes (Signed)
[facility] PROGRESS NOTE  Arna Medicidward D Blackburn [id number] DOB: 1933/07/26 DOA: 05/24/2015 PCP: Pearson GrippeJames Kim, MD  Assessment/Plan: 1. Supra therapeutic INR. INR greater than 10 on admission, given 10mg  Vitamin K and 4 units of fresh plasma. INR has since been reversed. CT abd did not show any evidence of internal hemorrhage. 2. Chronic atrial fibrillation, rate controlled. Continue metoprolol. Continue Eliquis. Family reports difficulty in maintaining therapeutic levels of coumadin.   3. AKI on CKD Stage III. Patient's baseline reported to be approximately 2, on admission noted to be 3.63. Improvement noted after given IVF and fresh plasma. Continue to hold chlorthalidone and ACE. Continue to monitor. Creatinine appears to be near baseline. Follow up stool occult blood, although he has not had any evidence of gross bleeding. 4. GERD. Continue PPI 5. Essential HTN, Continue metoprolol. Chlorthalidone and Altace held secondary to acute kidney injury. Norvasc held since blood pressures appear to be running low. Improved 6. HLD, Continue statin. 7. Anemia. Possibly dilutional. Will continue to follow. 8. DM type 2. Blood sugars are higher due to steroids. Continue resistant scale insulin. Continue basal insulin 9. Acute gout attack. Uric acid noted to be elevated. Improvement noted with solumedrol, will continue. Continue on prednisone  Code Status: Full DVT prophylaxis:  Eliquis Family Communication: discussed with patient and family at the bedside  Disposition Plan: discharge to SNF once bed available   Consultants:  none  Procedures:  none  Antibiotics:  none  HPI/Subjective: Per family, believe he is doing well. Pt reports that his feet is very painful. Foot pain is new onset. Overall, the pain from gout is now improved.  Objective: Filed Vitals:   05/28/15 2100 05/29/15 0636  BP: 108/59 129/65  Pulse: 95 98  Temp: 98.8 F (37.1 C) 98.2 F (36.8 C)  Resp: 18 18     Intake/Output Summary (Last 24 hours) at 05/29/15 0658 Last data filed at 05/29/15 0636  Gross per 24 hour  Intake    720 ml  Output    750 ml  Net    -30 ml   Filed Weights   05/24/15 1749 05/25/15 0500 05/26/15 0600  Weight: 85.4 kg (188 lb 4.4 oz) 85.4 kg (188 lb 4.4 oz) 85.1 kg (187 lb 9.8 oz)    Exam:   General: NAD, looks comfortable  Cardiovascular: irregular, S1, S2   Respiratory: clear bilaterally, No wheezing, rales or rhonchi  Abdomen: soft, non tender, no distention , bowel sounds normal  Musculoskeletal: No edema b/l, erythema over dorsal aspect of left foot is improving. Tenderness also improving.  Skin: bruising noted over arms bilaterally and right flank  Data Reviewed: Basic Metabolic Panel:  Recent Labs Lab 05/24/15 1405 05/25/15 0447 05/26/15 0428 05/27/15 0430 05/28/15 0716  NA 142 147* 146* 144 141  K 3.9 3.5 3.6 3.3* 4.2  CL 114* 117* 118* 114* 114*  CO2 16* 18* 18* 19* 19*  GLUCOSE 173* 137* 187* 141* 247*  BUN 140* 124* 96* 73* 63*  CREATININE 3.63* 2.63* 1.97* 1.85* 1.89*  CALCIUM 8.2* 8.5* 8.3* 8.0* 7.8*   Liver Function Tests:  Recent Labs Lab 05/25/15 0447  AST 27  ALT 18  ALKPHOS 67  BILITOT 2.1*  PROT 7.0  ALBUMIN 4.0   No results for input(s): LIPASE, AMYLASE in the last 168 hours. No results for input(s): AMMONIA in the last 168 hours. CBC:  Recent Labs Lab 05/24/15 1351 05/25/15 0447 05/26/15 0428  WBC 9.9 7.7 8.6  NEUTROABS 7.3  --   --  HGB 12.9* 10.9* 11.0*  HCT 37.1* 31.7* 32.6*  MCV 94.2 94.6 96.4  PLT 174 147* 163   Cardiac Enzymes: No results for input(s): CKTOTAL, CKMB, CKMBINDEX, TROPONINI in the last 168 hours. BNP (last 3 results) No results for input(s): BNP in the last 8760 hours.  ProBNP (last 3 results) No results for input(s): PROBNP in the last 8760 hours.  CBG:  Recent Labs Lab 05/27/15 2106 05/28/15 0803 05/28/15 1144 05/28/15 1718 05/28/15 2121  GLUCAP 209* 214*  195* 268* 173*    Recent Results (from the past 240 hour(s))  MRSA PCR Screening     Status: None   Collection Time: 05/24/15  5:15 PM  Result Value Ref Range Status   MRSA by PCR NEGATIVE NEGATIVE Final    Comment:        The GeneXpert MRSA Assay (FDA approved for NASAL specimens only), is one component of a comprehensive MRSA colonization surveillance program. It is not intended to diagnose MRSA infection nor to guide or monitor treatment for MRSA infections.      Studies: No results found.  Scheduled Meds: . apixaban  2.5 mg Oral BID  . feeding supplement (ENSURE ENLIVE)  237 mL Oral BID BM  . insulin aspart  0-20 Units Subcutaneous TID WC  . insulin aspart  0-5 Units Subcutaneous QHS  . insulin glargine  20 Units Subcutaneous Daily  . metoprolol  50 mg Oral BID  . pantoprazole  40 mg Oral BID  . polyethylene glycol  17 g Oral Daily  . predniSONE  40 mg Oral Q breakfast  . simvastatin  20 mg Oral Daily  . sodium chloride flush  3 mL Intravenous Q12H  . tamsulosin  0.4 mg Oral QPC supper   Continuous Infusions:    Principal Problem:   Supratherapeutic INR Active Problems:   Hypertension   Hyperlipidemia   Diabetes mellitus type 2, insulin dependent (HCC)   Chronic anticoagulation   Acute renal failure superimposed on stage 3 chronic kidney disease (HCC)   Atrial fibrillation (HCC)   Protein-calorie malnutrition, severe    Time spent:     Erick Blinks, MD  [facility] Pager [phone]. If 7PM-7AM, please contact night-coverage at [email or web address], password [facility] 05/29/2015, 6:58 AM  LOS: 5 days    By signing my name below, I, Adron Bene, attest that this documentation has been prepared under the direction and in the presence of Erick Blinks, MD. Electronically Signed: Adron Bene 05/29/2015  11:40am  I, Dr. Erick Blinks, personally performed the services described in this documentaiton. All medical record entries made by  the scribe were at my direction and in my presence. I have reviewed the chart and agree that the record reflects my personal performance and is accurate and complete  Erick Blinks, MD, 05/29/2015 11:49 AM

## 2015-05-30 ENCOUNTER — Inpatient Hospital Stay
Admission: RE | Admit: 2015-05-30 | Discharge: 2015-06-17 | Disposition: A | Discharge status: Home / Self Care | Payer: Medicare Other | Source: Ambulatory Visit | Attending: *Deleted | Admitting: *Deleted

## 2015-05-30 DIAGNOSIS — R262 Difficulty in walking, not elsewhere classified: Secondary | ICD-10-CM | POA: Diagnosis not present

## 2015-05-30 DIAGNOSIS — N179 Acute kidney failure, unspecified: Secondary | ICD-10-CM | POA: Diagnosis not present

## 2015-05-30 DIAGNOSIS — I480 Paroxysmal atrial fibrillation: Secondary | ICD-10-CM | POA: Diagnosis not present

## 2015-05-30 DIAGNOSIS — Z7901 Long term (current) use of anticoagulants: Secondary | ICD-10-CM | POA: Diagnosis not present

## 2015-05-30 DIAGNOSIS — E119 Type 2 diabetes mellitus without complications: Secondary | ICD-10-CM | POA: Diagnosis not present

## 2015-05-30 DIAGNOSIS — R791 Abnormal coagulation profile: Secondary | ICD-10-CM | POA: Diagnosis not present

## 2015-05-30 DIAGNOSIS — M10372 Gout due to renal impairment, left ankle and foot: Secondary | ICD-10-CM

## 2015-05-30 DIAGNOSIS — N183 Chronic kidney disease, stage 3 (moderate): Secondary | ICD-10-CM | POA: Diagnosis not present

## 2015-05-30 DIAGNOSIS — R488 Other symbolic dysfunctions: Secondary | ICD-10-CM | POA: Diagnosis not present

## 2015-05-30 DIAGNOSIS — M1000 Idiopathic gout, unspecified site: Secondary | ICD-10-CM | POA: Diagnosis not present

## 2015-05-30 DIAGNOSIS — E785 Hyperlipidemia, unspecified: Secondary | ICD-10-CM | POA: Diagnosis not present

## 2015-05-30 DIAGNOSIS — M109 Gout, unspecified: Secondary | ICD-10-CM | POA: Diagnosis not present

## 2015-05-30 DIAGNOSIS — R278 Other lack of coordination: Secondary | ICD-10-CM | POA: Diagnosis not present

## 2015-05-30 DIAGNOSIS — M6281 Muscle weakness (generalized): Secondary | ICD-10-CM | POA: Diagnosis not present

## 2015-05-30 DIAGNOSIS — N189 Chronic kidney disease, unspecified: Secondary | ICD-10-CM | POA: Diagnosis not present

## 2015-05-30 DIAGNOSIS — E43 Unspecified severe protein-calorie malnutrition: Secondary | ICD-10-CM | POA: Diagnosis not present

## 2015-05-30 DIAGNOSIS — I10 Essential (primary) hypertension: Secondary | ICD-10-CM | POA: Diagnosis not present

## 2015-05-30 DIAGNOSIS — M1039 Gout due to renal impairment, multiple sites: Secondary | ICD-10-CM | POA: Diagnosis not present

## 2015-05-30 DIAGNOSIS — I482 Chronic atrial fibrillation: Secondary | ICD-10-CM | POA: Diagnosis not present

## 2015-05-30 DIAGNOSIS — N289 Disorder of kidney and ureter, unspecified: Secondary | ICD-10-CM | POA: Diagnosis not present

## 2015-05-30 DIAGNOSIS — R233 Spontaneous ecchymoses: Secondary | ICD-10-CM | POA: Diagnosis not present

## 2015-05-30 DIAGNOSIS — M79646 Pain in unspecified finger(s): Secondary | ICD-10-CM | POA: Diagnosis not present

## 2015-05-30 DIAGNOSIS — Z794 Long term (current) use of insulin: Secondary | ICD-10-CM | POA: Diagnosis not present

## 2015-05-30 LAB — CBC
HEMATOCRIT: 31.9 % — AB (ref 39.0–52.0)
HEMOGLOBIN: 10.8 g/dL — AB (ref 13.0–17.0)
MCH: 32.4 pg (ref 26.0–34.0)
MCHC: 33.9 g/dL (ref 30.0–36.0)
MCV: 95.8 fL (ref 78.0–100.0)
Platelets: 211 10*3/uL (ref 150–400)
RBC: 3.33 MIL/uL — ABNORMAL LOW (ref 4.22–5.81)
RDW: 14.6 % (ref 11.5–15.5)
WBC: 11.9 10*3/uL — AB (ref 4.0–10.5)

## 2015-05-30 LAB — GLUCOSE, CAPILLARY
GLUCOSE-CAPILLARY: 136 mg/dL — AB (ref 65–99)
GLUCOSE-CAPILLARY: 167 mg/dL — AB (ref 65–99)
Glucose-Capillary: 138 mg/dL — ABNORMAL HIGH (ref 65–99)

## 2015-05-30 MED ORDER — GABAPENTIN 100 MG PO CAPS: 100.0000 mg | ORAL_CAPSULE | Freq: Three times a day (TID) | ORAL | Status: DC

## 2015-05-30 MED ORDER — POLYETHYLENE GLYCOL 3350 17 G PO PACK: 17.0000 g | PACK | Freq: Every day | ORAL | Status: DC

## 2015-05-30 MED ORDER — APIXABAN 2.5 MG PO TABS: 2.5000 mg | ORAL_TABLET | Freq: Two times a day (BID) | ORAL | Status: DC

## 2015-05-30 MED ORDER — PREDNISONE 20 MG PO TABS: ORAL_TABLET | ORAL | Status: DC

## 2015-05-30 MED ORDER — TAMSULOSIN HCL 0.4 MG PO CAPS: 0.4000 mg | ORAL_CAPSULE | Freq: Every day | ORAL | Status: DC

## 2015-05-30 NOTE — Clinical Social Work Note (Signed)
Clinical Social Work Assessment  Patient Details  Name: Matthew Boyd MRN: [id number] Date of Birth: 24-Sep-1933  Date of referral:  05/30/15               Reason for consult:  Facility Placement                Permission sought to share information with:    Permission granted to share information::     Name::        Agency::     Relationship::     Contact Information:     Housing/Transportation Living arrangements for the past 2 months:  Single Family Home Source of Information:  Patient, Spouse Patient Interpreter Needed:  None Criminal Activity/Legal Involvement Pertinent to Current Situation/Hospitalization:  No - Comment as needed Significant Relationships:  Adult Children, Spouse, Other Family Members Lives with:  Spouse Do you feel safe going back to the place where you live?  Yes Need for family participation in patient care:  Yes (Comment)  Care giving concerns: None identified.    Social Worker assessment / plan:  CSW met with patient, wife, son and daughter-in-law.  CSW discussed referral for SNF.  Patient and family were agreeable to SNF. CSW was advised that patient resides with his wife and at baseline ambulates unassisted and completes ADLs unassisted.  Family advised that they would prefer [address], [place], [place] or [place].  CSW faxed clinicals to facilities mentioned.   Employment status:  Retired Nurse, adult PT Recommendations:  [place] / Referral to community resources:     Patient/Family's Response to care:  Patient and family are agreeable to SNF.   Patient/Family's Understanding of and Emotional Response to Diagnosis, Current Treatment, and Prognosis:  Patient and family understand the diagnosis, treatment and prognosis.    Emotional Assessment Appearance:  Developmentally appropriate Attitude/Demeanor/Rapport:   (Cooperative) Affect (typically observed):  Accepting Orientation:   Oriented to Self, Oriented to Place, Oriented to  Time, Oriented to Situation Alcohol / Substance use:  Not Applicable Psych involvement (Current and /or in the community):  No (Comment)  Discharge Needs  Concerns to be addressed:  Discharge Planning Concerns Readmission within the last 30 days:  No Current discharge risk:  None Barriers to Discharge:  No Barriers Identified   Ihor Gully, LCSW 05/30/2015, 12:36 PM

## 2015-05-30 NOTE — Progress Notes (Signed)
[facility]. Report called,and given to Halford Decampenee Tejeda LPN. Vital signs stable. Staff to accompany patient to awaiting floor.

## 2015-05-30 NOTE — Progress Notes (Signed)
ANTICOAGULATION CONSULT NOTE   [facility]: [facility]  No Known Allergies  Patient Measurements: Height: 5\' 7"  (170.2 cm) Weight: 187 lb 9.8 oz (85.1 kg) IBW/kg (Calculated) : 66.1  Vital Signs: Temp: 98.2 F (36.8 C) (03/27 0633) Temp Source: Oral (03/27 16100633) BP: 114/61 mmHg (03/27 96040633) Pulse Rate: 94 (03/27 0633)  Labs:  Recent Labs  05/28/15 0716 05/30/15 0436  HGB  --  10.8*  HCT  --  31.9*  PLT  --  211  CREATININE 1.89*  --    Estimated Creatinine Clearance: 33 mL/min (by C-G formula based on Cr of 1.89).  Medical History: Past Medical History  Diagnosis Date  . Arteriosclerotic cardiovascular disease (ASCVD)     prior MI by echocardiography; stress nuclear -inferior scar; no prior cath  . Paroxysmal atrial fibrillation (HCC)  10/2009    onset in 10/2009; anticoagulation  . Hypertension   . Hyperlipidemia   . Diabetes mellitus type 2, insulin dependent (HCC)   . Nephrolithiasis     surgical stone extractionx2  . Chronic kidney disease     Creatinine of 1.5 in 11/03 and 1.8 in 10/08; 1.75 in 09/2009  . 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 Benign prostatic hypertrophy     s/p transurethral resection of the prostate   Assessment: 82yo male (will be 82yo in June).  Elevated SCr > 1.5.  Pt was on Coumadin previously with elevated INR on admission.  INR was reversed with Vitamin K and FFP.  Pt with h/o CAF.  Per MD, plan to switch to Eliquis since family reports difficulty maintaining therapeutic INR on Coumadin.  Goal of Therapy:  Anticoagulation with ELIQUIS Monitor platelets by anticoagulation protocol: Yes   Plan:  Eliquis 2.5mg  PO BID (adjusted due to age and SCr > 1.5) Monitor for s/sx of bleeding complications Education completed  Wayland DenisHall, Derran Sear A, [facility]PH 05/30/2015 11:14 AM

## 2015-05-30 NOTE — Care Management Important Message (Signed)
Important Message  Patient Details  Name: Matthew Boyd MRN: [id number] Date of Birth: 1933-10-12   Medicare Important Message Given:  Yes    [name], [name], RN 05/30/2015, 12:45 PM

## 2015-05-30 NOTE — Clinical Social Work Placement (Signed)
   CLINICAL SOCIAL WORK PLACEMENT  NOTE  Date:  05/30/2015  Patient Details  Name: Matthew Boyd MRN: [id number] Date of Birth: [birth year]  Clinical Social Work is seeking post-discharge placement for this patient at the Skilled  Nursing Facility level of care (*CSW will initial, date and re-position this form in  chart as items are completed):  Yes   Patient/family provided with [facility] Clinical Social Work Department's list of facilities offering this level of care within the geographic area requested by the patient (or if unable, by the patient's family).  Yes   Patient/family informed of their freedom to choose among providers that offer the needed level of care, that participate in Medicare, Medicaid or managed care program needed by the patient, have an available bed and are willing to accept the patient.  Yes   Patient/family informed of [facility]'s ownership interest in [facility] and [facility], as well as of the fact that they are under no obligation to receive care at these facilities.  PASRR submitted to EDS on 05/30/15     PASRR number received on 05/30/15     Existing PASRR number confirmed on       FL2 transmitted to all facilities in geographic area requested by pt/family on 05/30/15     FL2 transmitted to all facilities within larger geographic area on       Patient informed that his/her managed care company has contracts with or will negotiate with certain facilities, including the following:            Patient/family informed of bed offers received.  Patient chooses bed at       Physician recommends and patient chooses bed at      Patient to be transferred to   on  .  Patient to be transferred to facility by       Patient family notified on   of transfer.  Name of family member notified:        PHYSICIAN       Additional Comment:    _______________________________________________ Annice NeedySettle, Chalice Philbert D, LCSW 05/30/2015, 12:33 PM

## 2015-05-30 NOTE — NC FL2 (Signed)
[place] MEDICAID FL2 LEVEL OF CARE SCREENING TOOL     IDENTIFICATION  Patient Name: Matthew Boyd Birthdate: November 25, 1933 Sex: male Admission Date (Current Location): 05/24/2015  [facility] [place]   Facility and Address:  [facility],  [address], [place] [id number]      Provider Number: [id number]  Attending Physician Name and Address:  Erick BlinksJehanzeb Memon, MD  Relative Name and Phone Number:       Current Level of Care: Hospital Recommended Level of Care: Skilled Nursing Facility Prior Approval Number:    Date Approved/Denied:   PASRR Number:  [id number])  Discharge Plan: SNF    Current Diagnoses: Patient Active Problem List   Diagnosis Date Noted  . Acute gout 05/30/2015  . Protein-calorie malnutrition, severe 05/25/2015  . Supratherapeutic INR 05/24/2015  . Acute renal failure superimposed on stage 3 chronic kidney disease (HCC) 05/24/2015  . Atrial fibrillation (HCC) 05/24/2015  . Degenerative joint disease 02/28/2012  . Chronic anticoagulation 02/08/2011  . Arteriosclerotic cardiovascular disease (ASCVD)   . Hypertension   . Hyperlipidemia   . Diabetes mellitus type 2, insulin dependent (HCC)   . Nephrolithiasis   . Chronic kidney disease   . Tobacco abuse, in remission   . Paroxysmal atrial fibrillation (HCC) 10/03/2009    Orientation RESPIRATION BLADDER Height & Weight     Self, Time, Situation, Place  Normal Continent Weight: 187 lb 9.8 oz (85.1 kg) Height:  5\' 7"  (170.2 cm)  BEHAVIORAL SYMPTOMS/MOOD NEUROLOGICAL BOWEL NUTRITION STATUS      Continent Diet (Diet heart healthy, low sodium, carb modified. )  AMBULATORY STATUS COMMUNICATION OF NEEDS Skin   Extensive Assist Verbally Normal                       Personal Care Assistance Level of Assistance  Bathing, Dressing Bathing Assistance: Maximum assistance   Dressing Assistance: Maximum assistance     Functional Limitations Info              SPECIAL CARE FACTORS FREQUENCY  PT (By licensed PT)     PT Frequency:  (5x/week)              Contractures      Additional Factors Info  Insulin Sliding Scale, Psychotropic     Psychotropic Info:  (Neurontin) Insulin Sliding Scale Info:  (3x daily)       Current Medications (05/30/2015):  This is the current hospital active medication list Current Facility-Administered Medications  Medication Dose Route Frequency Provider Last Rate Last Dose  . acetaminophen (TYLENOL) tablet 650 mg  650 mg Oral Q6H PRN Clydie Braunondell A Smith, MD   650 mg at 05/27/15 1227   Or  . acetaminophen (TYLENOL) suppository 650 mg  650 mg Rectal Q6H PRN Clydie Braunondell A Smith, MD      . apixaban (ELIQUIS) tablet 2.5 mg  2.5 mg Oral BID Erick BlinksJehanzeb Memon, MD   2.5 mg at 05/30/15 1100  . feeding supplement (ENSURE ENLIVE) (ENSURE ENLIVE) liquid 237 mL  237 mL Oral BID BM Rondell Burtis JunesA Smith, MD   237 mL at 05/30/15 1113  . gabapentin (NEURONTIN) capsule 100 mg  100 mg Oral TID Erick BlinksJehanzeb Memon, MD   100 mg at 05/30/15 1113  . insulin aspart (novoLOG) injection 0-20 Units  0-20 Units Subcutaneous TID WC Erick BlinksJehanzeb Memon, MD   3 Units at 05/30/15 0916  . insulin aspart (novoLOG) injection 0-5 Units  0-5 Units Subcutaneous QHS Darden RestaurantsJehanzeb Memon,  MD   0 Units at 05/28/15 2200  . insulin glargine (LANTUS) injection 20 Units  20 Units Subcutaneous Daily Clydie Braun, MD   20 Units at 05/30/15 1113  . ipratropium-albuterol (DUONEB) 0.5-2.5 (3) MG/3ML nebulizer solution 3 mL  3 mL Nebulization Q2H PRN Rondell A Katrinka Blazing, MD      . metoprolol (LOPRESSOR) tablet 50 mg  50 mg Oral BID Clydie Braun, MD   50 mg at 05/30/15 1059  . ondansetron (ZOFRAN) tablet 4 mg  4 mg Oral Q6H PRN Clydie Braun, MD       Or  . ondansetron (ZOFRAN) injection 4 mg  4 mg Intravenous Q6H PRN Rondell A Katrinka Blazing, MD      . pantoprazole (PROTONIX) EC tablet 40 mg  40 mg Oral BID Erick Blinks, MD   40 mg at 05/30/15 1059  . polyethylene glycol (MIRALAX /  GLYCOLAX) packet 17 g  17 g Oral Daily Erick Blinks, MD   17 g at 05/30/15 1059  . predniSONE (DELTASONE) tablet 40 mg  40 mg Oral Q breakfast Erick Blinks, MD   40 mg at 05/30/15 1100  . simvastatin (ZOCOR) tablet 20 mg  20 mg Oral Daily Clydie Braun, MD   20 mg at 05/30/15 1100  . sodium chloride flush (NS) 0.9 % injection 3 mL  3 mL Intravenous Q12H Rondell A Katrinka Blazing, MD   3 mL at 05/30/15 1102  . tamsulosin (FLOMAX) capsule 0.4 mg  0.4 mg Oral QPC supper Erick Blinks, MD   0.4 mg at 05/29/15 1725     Discharge Medications: Please see discharge summary for a list of discharge medications.  Relevant Imaging Results:  Relevant Lab Results:   Additional Information    Khila Papp, Juleen China, LCSW

## 2015-05-30 NOTE — Discharge Summary (Signed)
Physician Discharge Summary  Matthew Boyd ZOX:[id number] DOB: 1933/12/25 DOA: 05/24/2015  PCP: Pearson GrippeJames Kim, MD  Admit date: 05/24/2015 Discharge date: 05/30/2015  Time spent: 35 minutes  Recommendations for Outpatient Follow-up:  1. Follow up with PCP in 1-2 weeks.  2. Discharge to SNF   Discharge Diagnoses:  Principal Problem:   Supratherapeutic INR Active Problems:   Hypertension   Hyperlipidemia   Diabetes mellitus type 2, insulin dependent (HCC)   Chronic anticoagulation   Acute renal failure superimposed on stage 3 chronic kidney disease (HCC)   Atrial fibrillation (HCC)   Protein-calorie malnutrition, severe   Acute gout   Discharge Condition: Improved  Diet recommendation: Heart healthy-Carb modified.   Filed Weights   05/24/15 1749 05/25/15 0500 05/26/15 0600  Weight: 85.4 kg (188 lb 4.4 oz) 85.4 kg (188 lb 4.4 oz) 85.1 kg (187 lb 9.8 oz)    History of present illness:  [age] yom with past medical history of PAF on anticoagulation, HTN, HLD, DM Type 2, CKD Stage II presented by instruction of PC for supratherapeutic INR. He also also complained of associated symptoms of small amount of hemoptysis, heartburn, and mild shortness of breath Patient noted to have issues controlling INR, but unable to switch medications due to cost. Lab work also revealed a rise in his creatinine to 3.63 and BUN to 140. Patient's has a baseline creatinine of around 2 per review of last hospitalization in 02/2015. During that same hospitalization where patient was noted to have a supratherapeutic INR 4.81 at the time.  Hospital Course:  On admission patient noted to have supra therapeutic INR greater than 10/ He was given Vitamin K and 4 units fresh plasma with reversal of INR noted. Bruising on right flank noted. CT abd did not show any evidence of internal hemorrhage.   Chronic atrial fibrillation, rate controlled. Continue metoprolol. Patient started on Eliquis, since family reports  difficulty in maintaining therapeutic levels of coumadin.   AKI on CKD Stage III. Creatinine at baseline. Improvement noted after given IVF and fresh plasma. Continue to hold chlorthalidone and ACE. Continue to monitor.  Essential HTN, stable. Continue metoprolol. Chlorthalidone and Altace held secondary to acute kidney injury. Norvasc held since blood pressures appeared to be running low.   HLD, Continue statin.  Anemia. Possibly dilutional.Appears stable.   DM type 2. Blood sugars appeared to be are higher due to steroids. Continue SSI and basal insulin  Acute gout attack. Improved. Continue on prednisone  Procedures:  4 units fresh plasma   Consultations:  PT-SNF   Discharge Exam: Filed Vitals:   05/30/15 0633 05/30/15 1111  BP: 114/61 109/66  Pulse: 94 85  Temp: 98.2 F (36.8 C)   Resp: 18      General: NAD, looks comfortable  Cardiovascular: Irregular. No murmurs.    Respiratory: clear bilaterally, No wheezing, rales or rhonchi  Abdomen: soft, non tender, no distention , bowel sounds normal  Musculoskeletal: No edema b/l  Discharge Instructions   Discharge Instructions    Diet - low sodium heart healthy    Complete by:  As directed      Increase activity slowly    Complete by:  As directed           Current Discharge Medication List    START taking these medications   Details  apixaban (ELIQUIS) 2.5 MG TABS tablet Take 1 tablet (2.5 mg total) by mouth 2 (two) times daily. Qty: 60 tablet    gabapentin (NEURONTIN) 100 MG  capsule Take 1 capsule (100 mg total) by mouth 3 (three) times daily.    polyethylene glycol (MIRALAX / GLYCOLAX) packet Take 17 g by mouth daily. Qty: 14 each, Refills: 0    predniSONE (DELTASONE) 20 MG tablet Take  po daily for 2 days then  daily for 2 days then  daily for 2 days then  daily for 2 days then stop    tamsulosin (FLOMAX) 0.4 MG CAPS capsule Take 1 capsule (0.4 mg total) by mouth daily after  supper. Qty: 30 capsule      CONTINUE these medications which have NOT CHANGED   Details  docusate sodium (COLACE) 100 MG capsule Take 200 mg by mouth daily.    Insulin Glargine (LANTUS SOLOSTAR) 100 UNIT/ML Solostar Pen Inject 20 Units into the skin daily.    metoprolol (LOPRESSOR) 50 MG tablet Take 50 mg by mouth 2 (two) times daily.      niacin 500 MG tablet Take 500 mg by mouth daily.    simvastatin (ZOCOR) 20 MG tablet Take 20 mg by mouth daily. Refills: 3    acetaminophen (TYLENOL) 500 MG tablet Take 1,000 mg by mouth every 6 (six) hours as needed for mild pain or moderate pain.       STOP taking these medications     amLODipine (NORVASC) 5 MG tablet      chlorthalidone (HYGROTON) 25 MG tablet      ramipril (ALTACE) 10 MG capsule      warfarin (COUMADIN) 4 MG tablet      warfarin (COUMADIN) 5 MG tablet        No Known Allergies Follow-up Information    Follow up with [facility].   Contact information:   [address] [place] [address] [phone]        The results of significant diagnostics from this hospitalization (including imaging, microbiology, ancillary and laboratory) are listed below for reference.    Significant Diagnostic Studies: Ct Abdomen Pelvis Wo Contrast  05/25/2015  CLINICAL DATA:  Anemia. On anticoagulation. Evaluate for hemorrhage. EXAM: CT ABDOMEN AND PELVIS WITHOUT CONTRAST TECHNIQUE: Multidetector CT imaging of the abdomen and pelvis was performed following the standard protocol without IV contrast. COMPARISON:  None. FINDINGS: Lower chest:  No acute findings. Hepatobiliary: No mass visualized on this un-enhanced exam. Gallbladder is unremarkable. Pancreas: No mass or inflammatory process identified on this un-enhanced exam. Spleen: Within normal limits in size. Adrenals/Urinary Tract: No evidence of urolithiasis or hydronephrosis. Mild bilateral renal parenchymal scarring noted. No definite mass visualized  on this un-enhanced exam. Urinary bladder is empty with Foley catheter in place. Stomach/Bowel: No evidence of obstruction, inflammatory process, or abnormal fluid collections. Normal appendix visualized. Vascular/Lymphatic: No pathologically enlarged lymph nodes. No evidence of abdominal aortic aneurysm. Aortic atherosclerotic plaque noted. Reproductive: No mass or other significant abnormality. Other: No evidence of hemoperitoneum or retroperitoneal hemorrhage. Musculoskeletal: Degenerative lumbar spondylosis noted. Bilateral L5 pars defects seen with grade 1 anterolisthesis at L5-S1. IMPRESSION: No evidence retroperitoneal hemorrhage, hemoperitoneum, or other acute findings. Electronically Signed   By: Myles Rosenthal M.D.   On: 05/25/2015 16:22   Dg Chest Port 1 View  05/24/2015  CLINICAL DATA:  Shortness of breath. Dry nonproductive cough. Former smoker. EXAM: PORTABLE CHEST 1 VIEW COMPARISON:  05/01/2004 FINDINGS: The cardiac silhouette is likely within normal limits for AP technique. The lungs are well inflated without evidence of confluent airspace opacity, edema, pleural effusion, or pneumothorax. No acute osseous abnormality is identified. IMPRESSION: No  active disease. Electronically Signed   By: Sebastian Ache M.D.   On: 05/24/2015 18:42   Dg Foot Complete Left  05/26/2015  CLINICAL DATA:  Left foot pain 1 day. Redness and swelling dorsal surface at level of metatarsals. No injury. Diabetes. EXAM: LEFT FOOT - COMPLETE 3+ VIEW COMPARISON:  None. FINDINGS: There are mild degenerative changes over the interphalangeal joints. There are degenerative changes over the mid and hindfoot region. Spurring over the posterior and inferior calcaneus. No acute fracture or dislocation. Small vessel atherosclerotic disease is present. IMPRESSION: No acute findings. Degenerative changes as described. Electronically Signed   By: Elberta Fortis M.D.   On: 05/26/2015 17:18    Microbiology: Recent Results (from the past  240 hour(s))  MRSA PCR Screening     Status: None   Collection Time: 05/24/15  5:15 PM  Result Value Ref Range Status   MRSA by PCR NEGATIVE NEGATIVE Final    Comment:        The GeneXpert MRSA Assay (FDA approved for NASAL specimens only), is one component of a comprehensive MRSA colonization surveillance program. It is not intended to diagnose MRSA infection nor to guide or monitor treatment for MRSA infections.      Labs: Basic Metabolic Panel:  Recent Labs Lab 05/24/15 1405 05/25/15 0447 05/26/15 0428 05/27/15 0430 05/28/15 0716  NA 142 147* 146* 144 141  K 3.9 3.5 3.6 3.3* 4.2  CL 114* 117* 118* 114* 114*  CO2 16* 18* 18* 19* 19*  GLUCOSE 173* 137* 187* 141* 247*  BUN 140* 124* 96* 73* 63*  CREATININE 3.63* 2.63* 1.97* 1.85* 1.89*  CALCIUM 8.2* 8.5* 8.3* 8.0* 7.8*   Liver Function Tests:  Recent Labs Lab 05/25/15 0447  AST 27  ALT 18  ALKPHOS 67  BILITOT 2.1*  PROT 7.0  ALBUMIN 4.0   CBC:  Recent Labs Lab 05/24/15 1351 05/25/15 0447 05/26/15 0428 05/30/15 0436  WBC 9.9 7.7 8.6 11.9*  NEUTROABS 7.3  --   --   --   HGB 12.9* 10.9* 11.0* 10.8*  HCT 37.1* 31.7* 32.6* 31.9*  MCV 94.2 94.6 96.4 95.8  PLT 174 147* 163 211   CBG:  Recent Labs Lab 05/29/15 1202 05/29/15 1632 05/29/15 2027 05/30/15 0742 05/30/15 1128  GLUCAP 207* 235* 157* 138* 136*       Signed:  Erick Blinks, MD  [facility] 05/30/2015, 12:19 PM    By signing my name below, I, Zadie Cleverly, attest that this documentation has been prepared under the direction and in the presence of Erick Blinks, MD. Electronically signed: Zadie Cleverly, Scribe. 05/30/2015 12:00PM   I, Dr. Erick Blinks, personally performed the services described in this documentaiton. All medical record entries made by the scribe were at my direction and in my presence. I have reviewed the chart and agree that the record reflects my personal performance and is accurate and  complete  Erick Blinks, MD, 05/30/2015 12:19 PM

## 2015-05-31 ENCOUNTER — Encounter (HOSPITAL_COMMUNITY)
Admission: RE | Admit: 2015-05-31 | Discharge: 2015-05-31 | Disposition: A | Discharge status: Home / Self Care | Payer: Medicare Other | Source: Skilled Nursing Facility | Attending: Internal Medicine | Admitting: Internal Medicine

## 2015-05-31 ENCOUNTER — Non-Acute Institutional Stay (SKILLED_NURSING_FACILITY): Payer: Medicare Other | Admitting: Internal Medicine

## 2015-05-31 ENCOUNTER — Encounter: Payer: Self-pay | Admitting: Internal Medicine

## 2015-05-31 DIAGNOSIS — Z794 Long term (current) use of insulin: Secondary | ICD-10-CM | POA: Diagnosis not present

## 2015-05-31 DIAGNOSIS — Z7901 Long term (current) use of anticoagulants: Secondary | ICD-10-CM | POA: Diagnosis not present

## 2015-05-31 DIAGNOSIS — E119 Type 2 diabetes mellitus without complications: Secondary | ICD-10-CM

## 2015-05-31 DIAGNOSIS — N189 Chronic kidney disease, unspecified: Secondary | ICD-10-CM | POA: Diagnosis not present

## 2015-05-31 DIAGNOSIS — N183 Chronic kidney disease, stage 3 (moderate): Secondary | ICD-10-CM | POA: Diagnosis not present

## 2015-05-31 DIAGNOSIS — M1039 Gout due to renal impairment, multiple sites: Secondary | ICD-10-CM | POA: Diagnosis not present

## 2015-05-31 DIAGNOSIS — N179 Acute kidney failure, unspecified: Secondary | ICD-10-CM | POA: Diagnosis not present

## 2015-05-31 LAB — BASIC METABOLIC PANEL
Anion gap: 11 (ref 5–15)
BUN: 62 mg/dL — AB (ref 6–20)
CALCIUM: 8.2 mg/dL — AB (ref 8.9–10.3)
CO2: 22 mmol/L (ref 22–32)
CREATININE: 1.65 mg/dL — AB (ref 0.61–1.24)
Chloride: 108 mmol/L (ref 101–111)
GFR, EST AFRICAN AMERICAN: 44 mL/min — AB (ref >60)
GFR, EST NON AFRICAN AMERICAN: 38 mL/min — AB (ref >60)
Glucose, Bld: 184 mg/dL — ABNORMAL HIGH (ref 65–99)
Potassium: 4.6 mmol/L (ref 3.5–5.1)
SODIUM: 141 mmol/L (ref 135–145)

## 2015-05-31 LAB — CBC
HCT: 35.6 % — ABNORMAL LOW (ref 39.0–52.0)
Hemoglobin: 12.4 g/dL — ABNORMAL LOW (ref 13.0–17.0)
MCH: 33.4 pg (ref 26.0–34.0)
MCHC: 34.8 g/dL (ref 30.0–36.0)
MCV: 96 fL (ref 78.0–100.0)
PLATELETS: 233 10*3/uL (ref 150–400)
RBC: 3.71 MIL/uL — ABNORMAL LOW (ref 4.22–5.81)
RDW: 14.2 % (ref 11.5–15.5)
WBC: 11.1 10*3/uL — ABNORMAL HIGH (ref 4.0–10.5)

## 2015-05-31 NOTE — Assessment & Plan Note (Signed)
02/16/14 scanned A1c was 6.8% Update A1c

## 2015-05-31 NOTE — Assessment & Plan Note (Signed)
Monitor for evidence of active bleeding

## 2015-05-31 NOTE — Patient Instructions (Signed)
See summary under each active problem in the Problem List with associated updated therapeutic plan. Completed document faxed to [facility]. 

## 2015-05-31 NOTE — Progress Notes (Signed)
This is a comprehensive admission to [facility] personally performed by Marga MelnickWilliam Lorriann Hansmann MD on this date less than 30 days from date of admission. WUJ:[id number]:[name], MD HPI: He was admitted 3/21-3/27/17 with a supratherapeutic INR of 10. This was in the context of some hemoptysis, dyspepsia, and mild dyspnea. Vitamin K and 4 units of plasma reversed the elevated INR. Bruising was noted on his flank; but CT revealed no internal bleeding. He was discharged on Eliquus as he has difficulty controlling his PT/INR as a chronic, recurrent problem. His hospitalization was complicated by acute gout for which a prednisone burst was initiated.  Past medical history: He has medical diagnoses of paroxysmal atrial fibrillation, atherosclerosis, insulin dependent diabetes and dyslipidemia. He also has chronic renal insufficiency. He has had a TURP. He has had 3 open procedures for renal calculi; one stone was embedded in the kidney.  Social history: He smoked for 25 years up to 1 pack per day; he stopped in 1987. He previously drank to excess but stopped in 2016.  Family history: His sister has atherosclerotic cardio vascular disease. A sister had diabetes as did his mother.father had myocardial infarction  Comprehensive review of systems: With the prednisone his gout is improved dramatically. He states joint pain is now a level V. Wife states he has had age-related hearing loss. Sneezing was treated with generic Flonase. He's had an intermittently productive cough; this is not active at this time.  His major symptom is weakness in his legs for which he was referred to this nursing facility. PT is to assess and pursue rehabilitation. Constitutional: No fever, chills, significant weight change, fatigue or night sweats Eyes: No redness, discharge, pain, blurred vision, double vision or loss of vision ENT/mouth: No nasal congestion, postnasal drainage,epistaxis, purulent discharge, earache, hearing loss,  tinnitus ,sore throat , or hoarseness   Cardiovascular: No chest pain, palpitations, racing, irregular rhythm, syncope, claudication or edema  Respiratory: No hemoptysis,  dyspnea, paroxysmal nocturnal dyspnea, pleuritic chest pain, significant snoring or  apnea    Gastrointestinal: No heartburn,dysphagia, nausea and vomiting,abdominal pain, diarrhea, significant constipation, rectal bleeding or melena Genitourinary: No dysuria,hematuria, pyuria, frequency, urgency,  incontinence, nocturia, or dark urine  Musculoskeletal: No myalgias or muscle cramping Dermatologic: No rash, pruritus, urticaria, or change in color or temperature of skin Neurologic: No headache, vertigo, limb weakness, tremor, seizures, memory loss, numbness or tingling Psychiatric: No significant anxiety or depression, panic attacks, insomnia, or anorexia Endocrine: No change in hair/skin/ nails, excessive thirst, excessive hunger, excessive urination or unexplained fatigue Hematologic/lymphatic: No  Lymphadenopathy Allergy/immunology: No itchy/ watery eyes, urticaria or angioedema  Physical exam:  Pertinent or positive findings: He has a beard and mustache. Ptosis is present bilaterally, greater on the left. He is completely edentulous & is not wearing dental plates. Heart sounds are distant and heart rhythm is irregular. Chest was surprisingly clear. He has decreased flexion of the fingers. There is decreased range of motion at the knees with fusiform changes and crepitus. Posterior tibial pulses are decreased. He has bruising over the dorsum of the hands and forearms. He also has a large bruise in the left lower quadrant. He's generally weak and ambullating with a walker and help.  General appearance:Adequately nourished; no acute distress or increased work of breathing is present.   Lymphatic: No  lymphadenopathy about the head, neck, or axilla . Eyes: No conjunctival inflammation or lid edema is present. There is no scleral  icterus. Ears:  External ear exam shows no  significant lesions or deformities.   Nose:  External nasal examination shows no deformity or inflammation. Nasal mucosa are pink and moist without lesions or exudates No septal dislocation or deviation.No obstruction to airflow.  Oral exam: lips and gums are healthy appearing.There is no oropharyngeal erythema or exudate . Neck:  No deformities, thyromegaly, masses, or tenderness noted.   Supple with full range of motion without pain.  Heart:  No gallop, murmur, click, rub or other extra sounds.  Lungs:Chest clear to auscultation; no wheezes, rhonchi,rales ,or rubs present. Abdomen:Bowel sounds are normal. Abdomen is soft and nontender with no organomegaly, hernias  or masses. GU: deferred as previously addressed. Extremities:  No cyanosis, edema, or clubbing noted. Neurologic exam : Deep tendon reflexes are normal. Skin: Warm & dry w/o tenting or jaundice. No significant lesions or rash.  See summary under each active problem in the Problem List with associated updated therapeutic plan

## 2015-06-10 ENCOUNTER — Non-Acute Institutional Stay (SKILLED_NURSING_FACILITY): Payer: Medicare Other | Admitting: Internal Medicine

## 2015-06-10 ENCOUNTER — Encounter: Payer: Self-pay | Admitting: Internal Medicine

## 2015-06-10 DIAGNOSIS — M1000 Idiopathic gout, unspecified site: Secondary | ICD-10-CM

## 2015-06-10 DIAGNOSIS — N189 Chronic kidney disease, unspecified: Secondary | ICD-10-CM

## 2015-06-10 DIAGNOSIS — M79646 Pain in unspecified finger(s): Secondary | ICD-10-CM

## 2015-06-10 NOTE — Progress Notes (Signed)
Patient ID: Matthew Boyd, male   DOB: [birth year], 82 y.o.   MRN: [id number]  Location:  [facility]   Place of Service:  SNF (31)   Pearson GrippeJames Kim, MD  Patient Care Team: Pearson GrippeJames Kim, MD as PCP - General (Internal Medicine) Antoine PocheJonathan F Branch, MD as Consulting Physician (Cardiology)  Extended Emergency Contact Information Primary Emergency Contact: Verne SpurrBarrett,Linda T Address: [address]          [place], [place] [id number] [place] Home Phone: [phone] Mobile Phone: [phone] Relation: Spouse   Goals of care: Advanced Directive information Advanced Directives 06/10/2015  Does patient have an advance directive? Yes  Type of Advance Directive Living will  Does patient want to make changes to advanced directive? No - Patient declined  Copy of advanced directive(s) in chart? Yes  Would patient like information on creating an advanced directive? -     Chief Complaint  Patient presents with  . Acute Visit  Secondary to finger pain-follow-up renal insufficiency  HPI:  Pt is a 82 y.o. male seen today for an acute visit for nursing note that he is having bilateral middle finger discomfort.  Also follow-up renal insufficiency  I talked patient today and he is denying the finger pain said it is better today-he has risk for gout in fact had a gout flare in the hospital and this was treated with a prednisone burst and apparently resolved.  He also history of chronic renal insufficiency most recent creatinine 1.65 on lab March 28 this appears to be improved from a hospital creatinine of fact at one point in hospital and appears creatinine was 3.63.  Currently he has no acute complaints continues with lower extremity weakness   Past Medical History  Diagnosis Date  . Arteriosclerotic cardiovascular disease (ASCVD)     prior MI by echocardiography; stress nuclear -inferior scar; no prior cath  . Paroxysmal atrial fibrillation (HCC)  10/2009    onset in  10/2009; anticoagulation  . Hypertension   . Hyperlipidemia   . Diabetes mellitus type 2, insulin dependent (HCC)   . Nephrolithiasis     surgical stone extractionx2  . Chronic kidney disease     Creatinine of 1.5 in 11/03 and 1.8 in 10/08; 1.75 in 09/2009  . 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 Benign prostatic hypertrophy     s/p transurethral resection of the prostate   Past Surgical History  Procedure Laterality Date  . Transurethral resection of prostate    . Surgical removal of renal calculi      3    No Known Allergies   Medications.  Tylenol 650 mg every 6 hours when necessary pain.  Colace 20 mg by mouth daily.  Flomax 0.4 mg daily.  Neurontin 100 mg 3 times a day.  Lantus insulin 20 units every morning.  Lopressor 50 mg twice a day.  MiraLAX daily.  Simvastatin 20 mg daily    Review of Systems   General no complaints of fever chills.  Skin does not complain of rashes or itching I do note what appears to be a small tophi on distal aspect of his middle fingers bilaterally.  Head ears eyes nose mouth and throat was no complaints of sore throat or visual changes.  Respirator does not complaining cough or shortness of breath.  Cardiac has a history of atrial fibrillation does not complain of chest pain does not really have significant lower  extremity edema.  GI is not complaining of a abdominal pain nausea vomiting diarrhea constipation.  Musculoskeletal again there was concern about finger pain but he is denying that today says his left knee is a little sore.  Neurologic does not complain of dizziness headache appears to have a history of neuropathy he is on Neurontin.  Psych does not complain of depression or anxiety  Immunization History  Administered Date(s) Administered  . Influenza-Unspecified 12/13/2014   Pertinent  Health Maintenance Due  Topic Date Due  . FOOT EXAM  08/28/1943  . OPHTHALMOLOGY EXAM   08/28/1943  . URINE MICROALBUMIN  [birth year]/28/1945  . PNA vac Low Risk Adult (1 of 2 - PCV13) 08/28/2000  . HEMOGLOBIN A1C  09/25/2009  . INFLUENZA VACCINE  10/04/2015   No flowsheet data found. Functional Status Survey:    Filed Vitals:   06/10/15 1149  BP: 128/63  Pulse: 68  Temp: 97.3 F (36.3 C)  TempSrc: Oral  Resp: 18  Height:  (1.702 m)  Weight: 187 lb (84.823 kg)   Body mass index is 29.28 kg/(m^2). Physical Exam   In general this is a pleasant elderly male in no distress sitting comfortably in his wheelchair.  His skin is warm and dry.  Again he appears to have small tophi distal aspects of his middle fingers bilaterally these are not tender or acutely erythematous or swollen.  Eyes pupils appear reactive to light sclera and conjunctiva clear visual acuity appears grossly intact.  Oropharynx is clear mucous membranes moist.  Chest is clear to auscultation there is no labored breathing.  Heart is regular rate and rhythm with occasional irregular beat does not have significant lower extremity edema.  Abdomen is soft nontender with positive bowel sounds.  Musculoskeletal again distal aspect of middle fingers tophi noted this does not appear acute however as noted above-there is no pain to palpation of his toes I do not note any edema or erythema of his great toes.  Lateral aspect of his left knee there is a small erythematous area this does not appear to be cellulitic there is some slight tenderness to palpation of this area.  Neurologic is grossly intact no lateralizing findings.  Psych he is pleasant and appropriate appears to have some confusion he is oriented to self and can carry on conversation but needs at times some help from his wife.    Labs reviewed:  Recent Labs  05/27/15 0430 05/28/15 0716 05/31/15 0740  NA 144 141 141  K 3.3* 4.2 4.6  CL 114* 114* 108  CO2 19* 19* 22  GLUCOSE 141* 247* 184*  BUN 73* 63* 62*  CREATININE 1.85* 1.89*  1.65*  CALCIUM 8.0* 7.8* 8.2*    Recent Labs  05/25/15 0447  AST 27  ALT 18  ALKPHOS 67  BILITOT 2.1*  PROT 7.0  ALBUMIN 4.0    Recent Labs  02/08/15 1955 05/24/15 1351  05/26/15 0428 05/30/15 0436 05/31/15 0740  WBC 9.7 9.9  < > 8.6 11.9* 11.1*  NEUTROABS 7.5 7.3  --   --   --   --   HGB 16.5 12.9*  < > 11.0* 10.8* 12.4*  HCT 48.1 37.1*  < > 32.6* 31.9* 35.6*  MCV 97.6 94.2  < > 96.4 95.8 96.0  PLT 178 174  < > 163 211 233  < > = values in this interval not displayed. Lab Results  Component Value Date   TSH 0.483 12/05/2009   Lab Results  Component  Value Date   HGBA1C 7.3* 03/28/2009   Lab Results  Component Value Date   CHOL 145 02/13/2011   HDL 60 02/13/2011   LDLCALC 61 02/13/2011   TRIG 121 02/13/2011   CHOLHDL 2.4 02/13/2011    Significant Diagnostic Results in last 30 days:  Ct Abdomen Pelvis Wo Contrast  05/25/2015  CLINICAL DATA:  Anemia. On anticoagulation. Evaluate for hemorrhage. EXAM: CT ABDOMEN AND PELVIS WITHOUT CONTRAST TECHNIQUE: Multidetector CT imaging of the abdomen and pelvis was performed following the standard protocol without IV contrast. COMPARISON:  None. FINDINGS: Lower chest:  No acute findings. Hepatobiliary: No mass visualized on this un-enhanced exam. Gallbladder is unremarkable. Pancreas: No mass or inflammatory process identified on this un-enhanced exam. Spleen: Within normal limits in size. Adrenals/Urinary Tract: No evidence of urolithiasis or hydronephrosis. Mild bilateral renal parenchymal scarring noted. No definite mass visualized on this un-enhanced exam. Urinary bladder is empty with Foley catheter in place. Stomach/Bowel: No evidence of obstruction, inflammatory process, or abnormal fluid collections. Normal appendix visualized. Vascular/Lymphatic: No pathologically enlarged lymph nodes. No evidence of abdominal aortic aneurysm. Aortic atherosclerotic plaque noted. Reproductive: No mass or other significant abnormality. Other:  No evidence of hemoperitoneum or retroperitoneal hemorrhage. Musculoskeletal: Degenerative lumbar spondylosis noted. Bilateral L5 pars defects seen with grade 1 anterolisthesis at L5-S1. IMPRESSION: No evidence retroperitoneal hemorrhage, hemoperitoneum, or other acute findings. Electronically Signed   By: Myles Rosenthal M.D.   On: 05/25/2015 16:22   Dg Chest Port 1 View  05/24/2015  CLINICAL DATA:  Shortness of breath. Dry nonproductive cough. Former smoker. EXAM: PORTABLE CHEST 1 VIEW COMPARISON:  05/01/2004 FINDINGS: The cardiac silhouette is likely within normal limits for AP technique. The lungs are well inflated without evidence of confluent airspace opacity, edema, pleural effusion, or pneumothorax. No acute osseous abnormality is identified. IMPRESSION: No active disease. Electronically Signed   By: Sebastian Ache M.D.   On: 05/24/2015 18:42   Dg Foot Complete Left  05/26/2015  CLINICAL DATA:  Left foot pain 1 day. Redness and swelling dorsal surface at level of metatarsals. No injury. Diabetes. EXAM: LEFT FOOT - COMPLETE 3+ VIEW COMPARISON:  None. FINDINGS: There are mild degenerative changes over the interphalangeal joints. There are degenerative changes over the mid and hindfoot region. Spurring over the posterior and inferior calcaneus. No acute fracture or dislocation. Small vessel atherosclerotic disease is present. IMPRESSION: No acute findings. Degenerative changes as described. Electronically Signed   By: Elberta Fortis M.D.   On: 05/26/2015 17:18    Assessment/Plan  #1-mid finger pain-also some mild left knee discomfort with a minimal amount of erythema-one would be concerned about possibly gout-will update a uric acid tomorrow morning and meantime monitorshe is getting Tylenol for pain-was  on Mobic but this was DC'd secondary to concerns about his renal function he is not complaining of pain today.  I note uric acid was over 10 at one point in the hospital.  #2-renal insufficiency this  actually appears to be improving baseline apparently is around 2 currently 1.65 on lab done March 28 will update this as well as.  #3-Will also update a CBC with differential for updated values-clinically he appears to be doing  fairly well.  [id number] note greater than 25 minutes spent assessing patient-reviewing his chart-and coordinating and from waiting a plan of care-of note greater than 50% of time spent coordinating plan of care with chart review

## 2015-06-11 ENCOUNTER — Encounter (HOSPITAL_COMMUNITY)
Admission: RE | Admit: 2015-06-11 | Discharge: 2015-06-11 | Disposition: A | Discharge status: Home / Self Care | Payer: Medicare Other | Source: Skilled Nursing Facility | Attending: Internal Medicine | Admitting: Internal Medicine

## 2015-06-11 ENCOUNTER — Encounter: Payer: Self-pay | Admitting: Internal Medicine

## 2015-06-11 DIAGNOSIS — N183 Chronic kidney disease, stage 3 (moderate): Secondary | ICD-10-CM | POA: Insufficient documentation

## 2015-06-11 DIAGNOSIS — M79646 Pain in unspecified finger(s): Secondary | ICD-10-CM | POA: Insufficient documentation

## 2015-06-11 DIAGNOSIS — M6281 Muscle weakness (generalized): Secondary | ICD-10-CM | POA: Insufficient documentation

## 2015-06-11 DIAGNOSIS — I2510 Atherosclerotic heart disease of native coronary artery without angina pectoris: Secondary | ICD-10-CM | POA: Insufficient documentation

## 2015-06-11 DIAGNOSIS — I482 Chronic atrial fibrillation: Secondary | ICD-10-CM | POA: Insufficient documentation

## 2015-06-11 DIAGNOSIS — M1000 Idiopathic gout, unspecified site: Secondary | ICD-10-CM | POA: Insufficient documentation

## 2015-06-11 LAB — BASIC METABOLIC PANEL
ANION GAP: 6 (ref 5–15)
BUN: 37 mg/dL — ABNORMAL HIGH (ref 6–20)
BUN: 37 mg/dL — AB (ref 4–21)
CHLORIDE: 107 mmol/L (ref 101–111)
CO2: 23 mmol/L (ref 22–32)
CREATININE: 1.6 mg/dL — AB (ref <=1.3)
Calcium: 7.7 mg/dL — ABNORMAL LOW (ref 8.9–10.3)
Creatinine, Ser: 1.61 mg/dL — ABNORMAL HIGH (ref 0.61–1.24)
GFR calc non Af Amer: 39 mL/min — ABNORMAL LOW (ref >60)
GFR, EST AFRICAN AMERICAN: 45 mL/min — AB (ref >60)
Glucose, Bld: 122 mg/dL — ABNORMAL HIGH (ref 65–99)
Glucose: 122 mg/dL
Potassium: 3.9 mmol/L (ref 3.5–5.1)
Sodium: 136 mmol/L (ref 135–145)
Sodium: 136 mmol/L — AB (ref 137–147)

## 2015-06-11 LAB — CBC WITH DIFFERENTIAL/PLATELET
Basophils Absolute: 0 10*3/uL (ref 0.0–0.1)
Basophils Relative: 0 %
Eosinophils Absolute: 0.2 10*3/uL (ref 0.0–0.7)
Eosinophils Relative: 2 %
HEMATOCRIT: 32.6 % — AB (ref 39.0–52.0)
HEMOGLOBIN: 11.1 g/dL — AB (ref 13.0–17.0)
LYMPHS ABS: 1.3 10*3/uL (ref 0.7–4.0)
Lymphocytes Relative: 18 %
MCH: 33.4 pg (ref 26.0–34.0)
MCHC: 34 g/dL (ref 30.0–36.0)
MCV: 98.2 fL (ref 78.0–100.0)
MONOS PCT: 10 %
Monocytes Absolute: 0.7 10*3/uL (ref 0.1–1.0)
NEUTROS ABS: 4.9 10*3/uL (ref 1.7–7.7)
NEUTROS PCT: 70 %
Platelets: 222 10*3/uL (ref 150–400)
RBC: 3.32 MIL/uL — ABNORMAL LOW (ref 4.22–5.81)
RDW: 14.3 % (ref 11.5–15.5)
WBC: 7.1 10*3/uL (ref 4.0–10.5)

## 2015-06-11 LAB — URIC ACID: Uric Acid, Serum: 7.6 mg/dL (ref 4.4–7.6)

## 2015-06-11 LAB — CBC AND DIFFERENTIAL: WBC: 3.3 10^3/mL

## 2015-06-11 NOTE — Progress Notes (Signed)
Patient ID: Matthew Boyd, male   DOB: 06/15/1933, 81 y.o.   MRN: [id number]  

## 2015-06-14 ENCOUNTER — Encounter (HOSPITAL_COMMUNITY)
Admission: RE | Admit: 2015-06-14 | Discharge: 2015-06-14 | Disposition: A | Discharge status: Home / Self Care | Payer: Medicare Other | Source: Skilled Nursing Facility | Attending: Internal Medicine | Admitting: Internal Medicine

## 2015-06-14 ENCOUNTER — Encounter: Payer: Self-pay | Admitting: Internal Medicine

## 2015-06-14 ENCOUNTER — Non-Acute Institutional Stay (SKILLED_NURSING_FACILITY): Payer: Medicare Other | Admitting: Internal Medicine

## 2015-06-14 DIAGNOSIS — M1000 Idiopathic gout, unspecified site: Secondary | ICD-10-CM

## 2015-06-14 DIAGNOSIS — N289 Disorder of kidney and ureter, unspecified: Secondary | ICD-10-CM | POA: Diagnosis not present

## 2015-06-14 DIAGNOSIS — E119 Type 2 diabetes mellitus without complications: Secondary | ICD-10-CM

## 2015-06-14 DIAGNOSIS — Z794 Long term (current) use of insulin: Secondary | ICD-10-CM

## 2015-06-14 DIAGNOSIS — I480 Paroxysmal atrial fibrillation: Secondary | ICD-10-CM

## 2015-06-14 LAB — BASIC METABOLIC PANEL
Anion gap: 7 (ref 5–15)
BUN: 27 mg/dL — AB (ref 4–21)
BUN: 27 mg/dL — AB (ref 6–20)
CHLORIDE: 106 mmol/L (ref 101–111)
CO2: 26 mmol/L (ref 22–32)
CREATININE: 1.4 mg/dL — AB (ref <=1.3)
Calcium: 8.6 mg/dL — ABNORMAL LOW (ref 8.9–10.3)
Creatinine, Ser: 1.45 mg/dL — ABNORMAL HIGH (ref 0.61–1.24)
GFR calc Af Amer: 51 mL/min — ABNORMAL LOW (ref >60)
GFR calc non Af Amer: 44 mL/min — ABNORMAL LOW (ref >60)
GLUCOSE: 119 mg/dL — AB (ref 65–99)
POTASSIUM: 4.3 mmol/L (ref 3.5–5.1)
Sodium: 139 mmol/L (ref 135–145)
Sodium: 139 mmol/L (ref 137–147)

## 2015-06-14 LAB — URIC ACID: Uric Acid, Serum: 7.1 mg/dL (ref 4.4–7.6)

## 2015-06-14 NOTE — Progress Notes (Signed)
Patient ID: Matthew Boyd, male   DOB: 15-Nov-1933, 82 y.o.   MRN: [id number]  Location:  [facility]   Place of Service:  SNF (31)    PCP: [name], MD Patient Care Team: [name], MD as PCP - General (Internal Medicine) Antoine Poche, MD as Consulting Physician (Cardiology)  Extended Emergency Contact Information Primary Emergency Contact: Verne Spurr Address: [address]          [place], [place] [address] [place] Home Phone: [phone] Mobile Phone: [phone] Relation: Spouse   Goals of care:  Advanced Directive information Advanced Directives 06/14/2015  Does patient have an advance directive? Yes  Type of Advance Directive Living will  Does patient want to make changes to advanced directive? No - Patient declined  Copy of advanced directive(s) in chart? Yes     No Known Allergies  Chief Complaint  Patient presents with  . Discharge Note    HPI:  82 y.o. male  being seen today for discharge.  He was initially hospitalized with supratherapeutic INR he was on chronic Coumadin with a history of a 2 fibrillation-INR was over 10 he did have some bruising this was reversed with vitamin K he did receive plasma as well.  His stay here is been relatively unremarkable he did appear to have a gout flare in the hospital but this resolved he really has not had a reoccurrence here he is not complaining of joint pain today.  He does have a history of a TURP as well.  In regards anticoagulation he was switched to Eliquis secondary to difficulty maintaining an adequate INR with Coumadin.  He will be going home with his wife he will need continued PT and OT secondary to continued weakness.  In regards to diabetes he continues on Lantus 20 units every morning CBGs appear to be stable mainly in the lower mid 100s in the morning lately in the mid 100s at noon in the lower 200s high 100s at 4 PM and this appears to be the case as well at at  bedtime.  Patient also has a history of chronic kidney disease in fact his creatinine was over 3 in the hospital at one point most recent lab done on April 11 shows improvement at 1.45.  Regards to atrial fibrillation again he is on Eliquis and is on Lopressor for rate control.      Past Medical History  Diagnosis Date  . Arteriosclerotic cardiovascular disease (ASCVD)     prior MI by echocardiography; stress nuclear -inferior scar; no prior cath  . Paroxysmal atrial fibrillation (HCC)  10/2009    onset in 10/2009; anticoagulation  . Hypertension   . Hyperlipidemia   . Diabetes mellitus type 2, insulin dependent (HCC)   . Nephrolithiasis     surgical stone extractionx2  . Chronic kidney disease     Creatinine of 1.5 in 11/03 and 1.8 in 10/08; 1.75 in 09/2009  . 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 Benign prostatic hypertrophy     s/p transurethral resection of the prostate    Past Surgical History  Procedure Laterality Date  . Transurethral resection of prostate    . Surgical removal of renal calculi      3      reports that he quit smoking about 27 years ago. His smoking use included Cigarettes. He started smoking about 59 years ago. He has a 20 pack-year  smoking history. He has never used smokeless tobacco. He reports that he drinks alcohol. He reports that he does not use illicit drugs. Social History   Social History  . Marital Status: Married    Spouse Name: N/A  . Number of Children: N/A  . Years of Education: N/A   Occupational History  . Retired    Social History Main Topics  . Smoking status: Former Smoker -- 1.00 packs/day for 20 years    Types: Cigarettes    Start date: 08/29/1955    Quit date: 03/05/1988  . Smokeless tobacco: Never Used     Comment: Smoked 25 years  . Alcohol Use: 0.0 oz/week    0 Standard drinks or equivalent per week     Comment: Excessive use in the 1990s 04/08/14- no longer drinks   . Drug Use: No   . Sexual Activity: Not Currently   Other Topics Concern  . Not on file   Social History Narrative   Married with 2 adult children  Family history positive for coronary artery disease and diabetes in sisters. Fatherd with a history of MI    No Known Allergies  Pertinent  Health Maintenance Due  Topic Date Due  . FOOT EXAM  08/28/1945  . OPHTHALMOLOGY EXAM  08/28/1945  . URINE MICROALBUMIN  08/28/1945  . PNA vac Low Risk Adult (1 of 2 - PCV13) 08/28/2000  . HEMOGLOBIN A1C  09/25/2009  . INFLUENZA VACCINE  10/04/2015    Medications:   Medication List    Notice    This visit is during an admission. Changes to the med list made in this visit will be reflected in the After Visit Summary of the admission.      Review of Systems   General no complaints of fever or chills.  Skin does not complain of rashes or itching he had a small erythematous area on the lateral left knee when I saw him previously but this has resolved.  Head ears eyes nose mouth and throat does not complain sore throat or nasal discharge.  Respiratory denies any shortness of breath or increased cough.  Cardiac no chest pain.  GU is not complaining of dysuria.  Muscle skeletal-at times had had some joint pain there were concerns for gout but this is better today uric acid was borderline normal range.  Neurologic is not complaining dizziness headache or numbness.  Psych does not complain of depression he does appear to have some cognitive decline.    Filed Vitals:   06/14/15 1012  BP: 115/71  Pulse: 76  Temp: 98 F (36.7 C)  TempSrc: Oral  Resp: 18  Height: 5\' 7"  (1.702 m)  Weight: 192 lb 6.4 oz (87.272 kg)  SpO2: 99%   Body mass index is 30.13 kg/(m^2). Physical Exam   In general this is a pleasant elderly male in no distress sitting comfortably in his wheelchair.  His skin is warm and dry.  Eyes appears reactive to light sclera and conjunctiva are clear visual acuity appears  grossly intactDoes have bilateral proptosis.  Oropharynx clear mucous membranes moist.  Chest is clear to auscultation there is no labored breathing.  Heart is regular irregular rate and rhythm without murmur gallop or rub he appears to have somewhat decreased edema since his arrival here.  Abdomen is soft nontender positive bowel sounds.  Musculoskeletal --continues to have decreased flexion of his fingers with some what appears to be possibly tophi present and distal extremities-decreased range of motion of his  knees bilaterally-continues to be weak ambulates at times with a walker.  Neurologic is grossly intact no lateralizing findings her speech is clear.  Psyche is oriented to self can carry on a conversation although at times does have some difficulty remembering things   Labs reviewed: Basic Metabolic Panel:  Recent Labs  16/10/96 0740  06/11/15 0700 06/14/15 06/14/15 0710  NA 141  < > 136 139 139  K 4.6  --  3.9  --  4.3  CL 108  --  107  --  106  CO2 22  --  23  --  26  GLUCOSE 184*  --  122*  --  119*  BUN 62*  < > 37* 27* 27*  CREATININE 1.65*  < > 1.61* 1.4* 1.45*  CALCIUM 8.2*  --  7.7*  --  8.6*  < > = values in this interval not displayed. Liver Function Tests:  Recent Labs  05/25/15 0447  AST 27  ALT 18  ALKPHOS 67  BILITOT 2.1*  PROT 7.0  ALBUMIN 4.0   No results for input(s): LIPASE, AMYLASE in the last 8760 hours. No results for input(s): AMMONIA in the last 8760 hours. CBC:  Recent Labs  02/08/15 1955 05/24/15 1351  05/30/15 0436 05/31/15 0740 06/11/15 06/11/15 0700  WBC 9.7 9.9  < > 11.9* 11.1* 3.3 7.1  NEUTROABS 7.5 7.3  --   --   --   --  4.9  HGB 16.5 12.9*  < > 10.8* 12.4*  --  11.1*  HCT 48.1 37.1*  < > 31.9* 35.6*  --  32.6*  MCV 97.6 94.2  < > 95.8 96.0  --  98.2  PLT 178 174  < > 211 233  --  222  < > = values in this interval not displayed. Cardiac Enzymes: No results for input(s): CKTOTAL, CKMB, CKMBINDEX, TROPONINI in the  last 8760 hours. BNP: Invalid input(s): POCBNP CBG:  Recent Labs  05/30/15 0742 05/30/15 1128 05/30/15 1640  GLUCAP 138* 136* 167*    Procedures and Imaging Studies During Stay: Ct Abdomen Pelvis Wo Contrast  05/25/2015  CLINICAL DATA:  Anemia. On anticoagulation. Evaluate for hemorrhage. EXAM: CT ABDOMEN AND PELVIS WITHOUT CONTRAST TECHNIQUE: Multidetector CT imaging of the abdomen and pelvis was performed following the standard protocol without IV contrast. COMPARISON:  None. FINDINGS: Lower chest:  No acute findings. Hepatobiliary: No mass visualized on this un-enhanced exam. Gallbladder is unremarkable. Pancreas: No mass or inflammatory process identified on this un-enhanced exam. Spleen: Within normal limits in size. Adrenals/Urinary Tract: No evidence of urolithiasis or hydronephrosis. Mild bilateral renal parenchymal scarring noted. No definite mass visualized on this un-enhanced exam. Urinary bladder is empty with Foley catheter in place. Stomach/Bowel: No evidence of obstruction, inflammatory process, or abnormal fluid collections. Normal appendix visualized. Vascular/Lymphatic: No pathologically enlarged lymph nodes. No evidence of abdominal aortic aneurysm. Aortic atherosclerotic plaque noted. Reproductive: No mass or other significant abnormality. Other: No evidence of hemoperitoneum or retroperitoneal hemorrhage. Musculoskeletal: Degenerative lumbar spondylosis noted. Bilateral L5 pars defects seen with grade 1 anterolisthesis at L5-S1. IMPRESSION: No evidence retroperitoneal hemorrhage, hemoperitoneum, or other acute findings. Electronically Signed   By: Myles Rosenthal M.D.   On: 05/25/2015 16:22   Dg Chest Port 1 View  05/24/2015  CLINICAL DATA:  Shortness of breath. Dry nonproductive cough. Former smoker. EXAM: PORTABLE CHEST 1 VIEW COMPARISON:  05/01/2004 FINDINGS: The cardiac silhouette is likely within normal limits for AP technique. The lungs are well inflated without evidence  of  confluent airspace opacity, edema, pleural effusion, or pneumothorax. No acute osseous abnormality is identified. IMPRESSION: No active disease. Electronically Signed   By: Sebastian Ache M.D.   On: 05/24/2015 18:42   Dg Foot Complete Left  05/26/2015  CLINICAL DATA:  Left foot pain 1 day. Redness and swelling dorsal surface at level of metatarsals. No injury. Diabetes. EXAM: LEFT FOOT - COMPLETE 3+ VIEW COMPARISON:  None. FINDINGS: There are mild degenerative changes over the interphalangeal joints. There are degenerative changes over the mid and hindfoot region. Spurring over the posterior and inferior calcaneus. No acute fracture or dislocation. Small vessel atherosclerotic disease is present. IMPRESSION: No acute findings. Degenerative changes as described. Electronically Signed   By: Elberta Fortis M.D.   On: 05/26/2015 17:18    Assessment/Plan:    History of supratherapeutic INR-again this was reversed as noted above appears to be stable in this regard's bruising appears to be resolving-he is now on Eliquis for anticoagulation with history of A. fib-Coumadin of course has been discontinued.  #2 history of A. fib A-again this is rate controlled on Lopressor is on Eliquis for anticoagulation at this point appears to be tolerating this.  #3 history of diabetes-she continues on Lantus 20 units every morning-at this point sugars appear relatively well controlled somewhat higher readings later in the day but will defer aggressive treatment of this to primary care provider.  #3 history of acute on chronic renal failure this appears stable with creatinine of 1.45 on lab done today.  4 history of gout-this appears to have stabilized apparently did have a flare in the hospital but this appears to be improved-there were concerns a few days skilled that he was developing a flare but uric acid has returned high normal and he says he is not really having significant pain today this will have to be  monitored  #5 history of TURP-she continues on Flomax this is not really been an issue during his stay here.  #6-history of neuropathy F suspect this is diabetic related I suspect this may be controlled being some to his weakness he is on Neurontin.  Again patient will be going home with his wife who is quite supportive he will need continued PT and OT for strengthening.    [id number] note greater than 30 minutes spent on this discharge summary-greater than 50% of time spent coordinating plan of care for numerous diagnoses

## 2015-06-19 NOTE — Progress Notes (Signed)
Patient ID: Matthew Boyd, male   DOB: Feb 26, 1934, 82 y.o.   MRN: [id number]

## 2015-06-20 DIAGNOSIS — Z794 Long term (current) use of insulin: Secondary | ICD-10-CM | POA: Diagnosis not present

## 2015-06-20 DIAGNOSIS — Z7902 Long term (current) use of antithrombotics/antiplatelets: Secondary | ICD-10-CM | POA: Diagnosis not present

## 2015-06-20 DIAGNOSIS — Z87891 Personal history of nicotine dependence: Secondary | ICD-10-CM | POA: Diagnosis not present

## 2015-06-20 DIAGNOSIS — I480 Paroxysmal atrial fibrillation: Secondary | ICD-10-CM | POA: Diagnosis not present

## 2015-06-20 DIAGNOSIS — E785 Hyperlipidemia, unspecified: Secondary | ICD-10-CM | POA: Diagnosis not present

## 2015-06-20 DIAGNOSIS — E1122 Type 2 diabetes mellitus with diabetic chronic kidney disease: Secondary | ICD-10-CM | POA: Diagnosis not present

## 2015-06-20 DIAGNOSIS — M109 Gout, unspecified: Secondary | ICD-10-CM | POA: Diagnosis not present

## 2015-06-20 DIAGNOSIS — N189 Chronic kidney disease, unspecified: Secondary | ICD-10-CM | POA: Diagnosis not present

## 2015-06-20 DIAGNOSIS — I2510 Atherosclerotic heart disease of native coronary artery without angina pectoris: Secondary | ICD-10-CM | POA: Diagnosis not present

## 2015-06-21 DIAGNOSIS — Z87891 Personal history of nicotine dependence: Secondary | ICD-10-CM | POA: Diagnosis not present

## 2015-06-21 DIAGNOSIS — N189 Chronic kidney disease, unspecified: Secondary | ICD-10-CM | POA: Diagnosis not present

## 2015-06-21 DIAGNOSIS — I10 Essential (primary) hypertension: Secondary | ICD-10-CM | POA: Diagnosis not present

## 2015-06-21 DIAGNOSIS — E1122 Type 2 diabetes mellitus with diabetic chronic kidney disease: Secondary | ICD-10-CM | POA: Diagnosis not present

## 2015-06-21 DIAGNOSIS — M109 Gout, unspecified: Secondary | ICD-10-CM | POA: Diagnosis not present

## 2015-06-21 DIAGNOSIS — E785 Hyperlipidemia, unspecified: Secondary | ICD-10-CM | POA: Diagnosis not present

## 2015-06-21 DIAGNOSIS — Z794 Long term (current) use of insulin: Secondary | ICD-10-CM | POA: Diagnosis not present

## 2015-06-21 DIAGNOSIS — N179 Acute kidney failure, unspecified: Secondary | ICD-10-CM | POA: Diagnosis not present

## 2015-06-21 DIAGNOSIS — E119 Type 2 diabetes mellitus without complications: Secondary | ICD-10-CM | POA: Diagnosis not present

## 2015-06-21 DIAGNOSIS — Z7902 Long term (current) use of antithrombotics/antiplatelets: Secondary | ICD-10-CM | POA: Diagnosis not present

## 2015-06-21 DIAGNOSIS — I480 Paroxysmal atrial fibrillation: Secondary | ICD-10-CM | POA: Diagnosis not present

## 2015-06-21 DIAGNOSIS — I2510 Atherosclerotic heart disease of native coronary artery without angina pectoris: Secondary | ICD-10-CM | POA: Diagnosis not present

## 2015-06-22 DIAGNOSIS — I480 Paroxysmal atrial fibrillation: Secondary | ICD-10-CM | POA: Diagnosis not present

## 2015-06-22 DIAGNOSIS — Z7902 Long term (current) use of antithrombotics/antiplatelets: Secondary | ICD-10-CM | POA: Diagnosis not present

## 2015-06-22 DIAGNOSIS — Z87891 Personal history of nicotine dependence: Secondary | ICD-10-CM | POA: Diagnosis not present

## 2015-06-22 DIAGNOSIS — E785 Hyperlipidemia, unspecified: Secondary | ICD-10-CM | POA: Diagnosis not present

## 2015-06-22 DIAGNOSIS — E1122 Type 2 diabetes mellitus with diabetic chronic kidney disease: Secondary | ICD-10-CM | POA: Diagnosis not present

## 2015-06-22 DIAGNOSIS — M109 Gout, unspecified: Secondary | ICD-10-CM | POA: Diagnosis not present

## 2015-06-22 DIAGNOSIS — I2510 Atherosclerotic heart disease of native coronary artery without angina pectoris: Secondary | ICD-10-CM | POA: Diagnosis not present

## 2015-06-22 DIAGNOSIS — Z794 Long term (current) use of insulin: Secondary | ICD-10-CM | POA: Diagnosis not present

## 2015-06-22 DIAGNOSIS — N189 Chronic kidney disease, unspecified: Secondary | ICD-10-CM | POA: Diagnosis not present

## 2015-06-27 DIAGNOSIS — E785 Hyperlipidemia, unspecified: Secondary | ICD-10-CM | POA: Diagnosis not present

## 2015-06-27 DIAGNOSIS — N189 Chronic kidney disease, unspecified: Secondary | ICD-10-CM | POA: Diagnosis not present

## 2015-06-27 DIAGNOSIS — Z794 Long term (current) use of insulin: Secondary | ICD-10-CM | POA: Diagnosis not present

## 2015-06-27 DIAGNOSIS — E1122 Type 2 diabetes mellitus with diabetic chronic kidney disease: Secondary | ICD-10-CM | POA: Diagnosis not present

## 2015-06-27 DIAGNOSIS — I480 Paroxysmal atrial fibrillation: Secondary | ICD-10-CM | POA: Diagnosis not present

## 2015-06-27 DIAGNOSIS — I2510 Atherosclerotic heart disease of native coronary artery without angina pectoris: Secondary | ICD-10-CM | POA: Diagnosis not present

## 2015-06-27 DIAGNOSIS — Z87891 Personal history of nicotine dependence: Secondary | ICD-10-CM | POA: Diagnosis not present

## 2015-06-27 DIAGNOSIS — M109 Gout, unspecified: Secondary | ICD-10-CM | POA: Diagnosis not present

## 2015-06-27 DIAGNOSIS — Z7902 Long term (current) use of antithrombotics/antiplatelets: Secondary | ICD-10-CM | POA: Diagnosis not present

## 2015-06-28 DIAGNOSIS — I10 Essential (primary) hypertension: Secondary | ICD-10-CM | POA: Diagnosis not present

## 2015-06-28 DIAGNOSIS — E118 Type 2 diabetes mellitus with unspecified complications: Secondary | ICD-10-CM | POA: Diagnosis not present

## 2015-06-29 DIAGNOSIS — E1122 Type 2 diabetes mellitus with diabetic chronic kidney disease: Secondary | ICD-10-CM | POA: Diagnosis not present

## 2015-06-29 DIAGNOSIS — M109 Gout, unspecified: Secondary | ICD-10-CM | POA: Diagnosis not present

## 2015-06-29 DIAGNOSIS — Z794 Long term (current) use of insulin: Secondary | ICD-10-CM | POA: Diagnosis not present

## 2015-06-29 DIAGNOSIS — Z7902 Long term (current) use of antithrombotics/antiplatelets: Secondary | ICD-10-CM | POA: Diagnosis not present

## 2015-06-29 DIAGNOSIS — I2510 Atherosclerotic heart disease of native coronary artery without angina pectoris: Secondary | ICD-10-CM | POA: Diagnosis not present

## 2015-06-29 DIAGNOSIS — N189 Chronic kidney disease, unspecified: Secondary | ICD-10-CM | POA: Diagnosis not present

## 2015-06-29 DIAGNOSIS — Z87891 Personal history of nicotine dependence: Secondary | ICD-10-CM | POA: Diagnosis not present

## 2015-06-29 DIAGNOSIS — I480 Paroxysmal atrial fibrillation: Secondary | ICD-10-CM | POA: Diagnosis not present

## 2015-06-29 DIAGNOSIS — E785 Hyperlipidemia, unspecified: Secondary | ICD-10-CM | POA: Diagnosis not present

## 2015-07-04 DIAGNOSIS — E1122 Type 2 diabetes mellitus with diabetic chronic kidney disease: Secondary | ICD-10-CM | POA: Diagnosis not present

## 2015-07-04 DIAGNOSIS — E785 Hyperlipidemia, unspecified: Secondary | ICD-10-CM | POA: Diagnosis not present

## 2015-07-04 DIAGNOSIS — Z7902 Long term (current) use of antithrombotics/antiplatelets: Secondary | ICD-10-CM | POA: Diagnosis not present

## 2015-07-04 DIAGNOSIS — I2510 Atherosclerotic heart disease of native coronary artery without angina pectoris: Secondary | ICD-10-CM | POA: Diagnosis not present

## 2015-07-04 DIAGNOSIS — I480 Paroxysmal atrial fibrillation: Secondary | ICD-10-CM | POA: Diagnosis not present

## 2015-07-04 DIAGNOSIS — Z87891 Personal history of nicotine dependence: Secondary | ICD-10-CM | POA: Diagnosis not present

## 2015-07-04 DIAGNOSIS — M109 Gout, unspecified: Secondary | ICD-10-CM | POA: Diagnosis not present

## 2015-07-04 DIAGNOSIS — N189 Chronic kidney disease, unspecified: Secondary | ICD-10-CM | POA: Diagnosis not present

## 2015-07-04 DIAGNOSIS — Z794 Long term (current) use of insulin: Secondary | ICD-10-CM | POA: Diagnosis not present

## 2015-08-15 DIAGNOSIS — I10 Essential (primary) hypertension: Secondary | ICD-10-CM | POA: Diagnosis not present

## 2015-08-15 DIAGNOSIS — E118 Type 2 diabetes mellitus with unspecified complications: Secondary | ICD-10-CM | POA: Diagnosis not present

## 2015-08-16 DIAGNOSIS — E118 Type 2 diabetes mellitus with unspecified complications: Secondary | ICD-10-CM | POA: Diagnosis not present

## 2015-08-16 DIAGNOSIS — E7800 Pure hypercholesterolemia, unspecified: Secondary | ICD-10-CM | POA: Diagnosis not present

## 2015-08-16 DIAGNOSIS — I480 Paroxysmal atrial fibrillation: Secondary | ICD-10-CM | POA: Diagnosis not present

## 2015-08-16 DIAGNOSIS — N183 Chronic kidney disease, stage 3 (moderate): Secondary | ICD-10-CM | POA: Diagnosis not present

## 2015-08-16 DIAGNOSIS — I10 Essential (primary) hypertension: Secondary | ICD-10-CM | POA: Diagnosis not present

## 2015-11-08 ENCOUNTER — Ambulatory Visit (INDEPENDENT_AMBULATORY_CARE_PROVIDER_SITE_OTHER): Payer: Medicare Other | Admitting: Cardiology

## 2015-11-08 ENCOUNTER — Ambulatory Visit: Payer: Medicare Other | Admitting: Cardiology

## 2015-11-08 ENCOUNTER — Encounter: Payer: Self-pay | Admitting: Cardiology

## 2015-11-08 VITALS — BP 112/56 | HR 70 | Ht 69.0 in | Wt 162.0 lb

## 2015-11-08 DIAGNOSIS — I10 Essential (primary) hypertension: Secondary | ICD-10-CM

## 2015-11-08 DIAGNOSIS — I480 Paroxysmal atrial fibrillation: Secondary | ICD-10-CM | POA: Diagnosis not present

## 2015-11-08 DIAGNOSIS — E785 Hyperlipidemia, unspecified: Secondary | ICD-10-CM

## 2015-11-08 DIAGNOSIS — I2510 Atherosclerotic heart disease of native coronary artery without angina pectoris: Secondary | ICD-10-CM | POA: Diagnosis not present

## 2015-11-08 NOTE — Patient Instructions (Signed)

## 2015-11-08 NOTE — Progress Notes (Signed)
Clinical Summary Matthew Boyd is a 82 y.o.male seen today for follow up of the following medical problems.    1. CAD  - evidence of prior infarct from imaging (echo and MPI), never had an acute clinical event or cath.  - LVEF 60-65% by echo 11/2009    - no chest pain. No SOB or DOE - compliant with meds  2. Paroxysmal afib  - changed to eliquis earlier this year after recurrent admissions with supratherapeutic INR and bleeding. Has done well on eliquis.  - denies any palpiatitions.   3. HTN  - does not check at home regularly - compliant with meds   4. Hyperlipidemia  - compliant with statin  - he reports upcoming labs with pcp.   5. CKD  - followed by pcp Past Medical History:  Diagnosis Date  . Arteriosclerotic cardiovascular disease (ASCVD)    prior MI by echocardiography; stress nuclear -inferior scar; no prior cath  . Benign prostatic hypertrophy    s/p transurethral resection of the prostate  . Chronic kidney disease    Creatinine of 1.5 in 11/03 and 1.8 in 10/08; 1.75 in 09/2009  . Diabetes mellitus type 2, insulin dependent (HCC)   . Hyperlipidemia   . Hypertension   . Nephrolithiasis    surgical stone extractionx2  . Paroxysmal atrial fibrillation (HCC)  10/2009   onset in 10/2009; anticoagulation  . Tobacco abuse, in remission    25 pack years, discontinued in 1990     No Known Allergies   Current Outpatient Prescriptions  Medication Sig Dispense Refill  . acetaminophen (TYLENOL) 325 MG tablet Take 650 mg by mouth every 6 (six) hours as needed for mild pain or moderate pain.    Matthew Boyd. apixaban (ELIQUIS) 2.5 MG TABS tablet Take 1 tablet (2.5 mg total) by mouth 2 (two) times daily. 60 tablet   . docusate sodium (COLACE) 100 MG capsule Take 200 mg by mouth daily.    Matthew Boyd. gabapentin (NEURONTIN) 100 MG capsule Take 1 capsule (100 mg total) by mouth 3 (three) times daily.    . Insulin Glargine (LANTUS SOLOSTAR) 100 UNIT/ML Solostar Pen Inject 20  Units into the skin daily.    . metoprolol (LOPRESSOR) 50 MG tablet Take 50 mg by mouth 2 (two) times daily.      . polyethylene glycol (MIRALAX / GLYCOLAX) packet Take 17 g by mouth daily. 14 each 0  . simvastatin (ZOCOR) 20 MG tablet Take 20 mg by mouth daily.  3  . tamsulosin (FLOMAX) 0.4 MG CAPS capsule Take 1 capsule (0.4 mg total) by mouth daily after supper. 30 capsule    No current facility-administered medications for this visit.      Past Surgical History:  Procedure Laterality Date  . Surgical removal of renal calculi     3  . TRANSURETHRAL RESECTION OF PROSTATE       No Known Allergies    Family History  Problem Relation Age of Onset  . Heart disease Sister   . Diabetes Sister   . Diabetes Mother   . Heart attack Father      Social History Matthew Boyd reports that he quit smoking about 27 years ago. His smoking use included Cigarettes. He started smoking about 60 years ago. He has a 20.00 pack-year smoking history. He has never used smokeless tobacco. Matthew Boyd reports that he drinks alcohol.   Review of Systems CONSTITUTIONAL: No weight loss, fever, chills, weakness or fatigue.  HEENT: Eyes: No  visual loss, blurred vision, double vision or yellow sclerae.No hearing loss, sneezing, congestion, runny nose or sore throat.  SKIN: No rash or itching.  CARDIOVASCULAR: per HPI RESPIRATORY: No shortness of breath, cough or sputum.  GASTROINTESTINAL: No anorexia, nausea, vomiting or diarrhea. No abdominal pain or blood.  GENITOURINARY: No burning on urination, no polyuria NEUROLOGICAL: No headache, dizziness, syncope, paralysis, ataxia, numbness or tingling in the extremities. No change in bowel or bladder control.  MUSCULOSKELETAL: No muscle, back pain, joint pain or stiffness.  LYMPHATICS: No enlarged nodes. No history of splenectomy.  PSYCHIATRIC: No history of depression or anxiety.  ENDOCRINOLOGIC: No reports of sweating, cold or heat intolerance. No  polyuria or polydipsia.  Matthew Boyd   Physical Examination Vitals:   11/08/15 0854  BP: (!) 112/56  Pulse: 70   Vitals:   11/08/15 0854  Weight: 162 lb (73.5 kg)  Height: 5\' 9"  (1.753 m)    Gen: resting comfortably, no acute distress HEENT: no scleral icterus, pupils equal round and reactive, no palptable cervical adenopathy,  CV: RRR, no m/r/g no jvd Resp: Clear to auscultation bilaterally GI: abdomen is soft, non-tender, non-distended, normal bowel sounds, no hepatosplenomegaly MSK: extremities are warm, no edema.  Skin: warm, no rash Neuro:  no focal deficits Psych: appropriate affect   Diagnostic Studies 05/2001 MPI  THE LEFT VENTRICLE WAS MILDLY DILATED. ON TOMOGRAPHIC IMAGES RECONSTRUCTED IN  STANDARD PLANES, THERE WAS A MILD DEFECT INVOLVING THE INFERIOR AND INFEROLATERAL MYOCARDIUM THAT  DEMONSTRATED NO REVERSIBILITY WHEN COMPARED TO REST IMAGES. THE GATED 3-D RECONSTRUCTION REVEALED  GLOBAL HYPOKINESIS WITH AN ESTIMATED EJECTION FRACTION OF .38. ON THE GATED TOMOGRAMS, THERE WAS  DECREASED SYSTOLIC ACCENTUATION OF ACTIVITY IN THE INFERIOR WALL. IMPRESSION ABNORMAL STRESS CARDIOLITE STUDY WITH IMPAIRED EXERCISE CAPACITY, A  NEGATIVE STRESS EKG, MILD LEFT VENTRICULAR DILATATION WITH MILD TO MODERATE GLOBAL SYSTOLIC DYSFUNCTION;  SCINTIGRAPHIC ABNORMALITIES MAY BE ARTIFACTUAL DUE TO DIAPHRAGMATIC ATTENUATION. NO  MYOCARDIAL ISCHEMIA IDENTIFIED. OTHER FINDINGS AS NOTED.   11/2009 Echo  LVEF 60-65%, cannot evaluate diastolic function.     Assessment and Plan  1. CAD  - presumed CAD based on evidence of infarct by imaging, no prior clinical event  -denies any recent symptoms - we will continue current meds  2. Afib  - no current symptoms  -doing well on eliquis. Multiple admissions with supratherapeutic INR and bleeding on coumadin, continue NOAC. CHADS2Vasc score is 4.   3. HTN  - at goal, he will continue current meds   4. Hyperlipidemia  -  request labs from his primary - continue current statin      Antoine Poche, M.D.

## 2015-11-11 DIAGNOSIS — I10 Essential (primary) hypertension: Secondary | ICD-10-CM | POA: Diagnosis not present

## 2015-11-11 DIAGNOSIS — E118 Type 2 diabetes mellitus with unspecified complications: Secondary | ICD-10-CM | POA: Diagnosis not present

## 2015-11-15 DIAGNOSIS — E118 Type 2 diabetes mellitus with unspecified complications: Secondary | ICD-10-CM | POA: Diagnosis not present

## 2015-11-15 DIAGNOSIS — E7800 Pure hypercholesterolemia, unspecified: Secondary | ICD-10-CM | POA: Diagnosis not present

## 2015-11-15 DIAGNOSIS — N183 Chronic kidney disease, stage 3 (moderate): Secondary | ICD-10-CM | POA: Diagnosis not present

## 2015-11-15 DIAGNOSIS — I480 Paroxysmal atrial fibrillation: Secondary | ICD-10-CM | POA: Diagnosis not present

## 2015-11-24 ENCOUNTER — Ambulatory Visit (HOSPITAL_COMMUNITY)
Admission: RE | Admit: 2015-11-24 | Discharge: 2015-11-24 | Disposition: A | Discharge status: Home / Self Care | Payer: Medicare Other | Source: Ambulatory Visit | Attending: Internal Medicine | Admitting: Internal Medicine

## 2015-11-24 ENCOUNTER — Other Ambulatory Visit (HOSPITAL_COMMUNITY): Payer: Self-pay | Admitting: Internal Medicine

## 2015-11-24 DIAGNOSIS — R634 Abnormal weight loss: Secondary | ICD-10-CM | POA: Insufficient documentation

## 2015-11-24 DIAGNOSIS — M4814 Ankylosing hyperostosis [Forestier], thoracic region: Secondary | ICD-10-CM | POA: Diagnosis not present

## 2015-12-13 DIAGNOSIS — E118 Type 2 diabetes mellitus with unspecified complications: Secondary | ICD-10-CM | POA: Diagnosis not present

## 2015-12-13 DIAGNOSIS — K5900 Constipation, unspecified: Secondary | ICD-10-CM | POA: Diagnosis not present

## 2015-12-13 DIAGNOSIS — I480 Paroxysmal atrial fibrillation: Secondary | ICD-10-CM | POA: Diagnosis not present

## 2015-12-13 DIAGNOSIS — Z23 Encounter for immunization: Secondary | ICD-10-CM | POA: Diagnosis not present

## 2015-12-13 DIAGNOSIS — E7800 Pure hypercholesterolemia, unspecified: Secondary | ICD-10-CM | POA: Diagnosis not present

## 2015-12-13 DIAGNOSIS — N183 Chronic kidney disease, stage 3 (moderate): Secondary | ICD-10-CM | POA: Diagnosis not present

## 2016-04-06 DIAGNOSIS — E118 Type 2 diabetes mellitus with unspecified complications: Secondary | ICD-10-CM | POA: Diagnosis not present

## 2016-04-06 DIAGNOSIS — E7800 Pure hypercholesterolemia, unspecified: Secondary | ICD-10-CM | POA: Diagnosis not present

## 2016-04-10 DIAGNOSIS — E118 Type 2 diabetes mellitus with unspecified complications: Secondary | ICD-10-CM | POA: Diagnosis not present

## 2016-04-10 DIAGNOSIS — N183 Chronic kidney disease, stage 3 (moderate): Secondary | ICD-10-CM | POA: Diagnosis not present

## 2016-04-10 DIAGNOSIS — I10 Essential (primary) hypertension: Secondary | ICD-10-CM | POA: Diagnosis not present

## 2016-04-10 DIAGNOSIS — E7800 Pure hypercholesterolemia, unspecified: Secondary | ICD-10-CM | POA: Diagnosis not present

## 2016-05-17 ENCOUNTER — Ambulatory Visit: Payer: Medicare Other | Admitting: Cardiology

## 2016-05-18 ENCOUNTER — Ambulatory Visit (INDEPENDENT_AMBULATORY_CARE_PROVIDER_SITE_OTHER): Payer: Medicare Other | Admitting: Cardiology

## 2016-05-18 ENCOUNTER — Encounter: Payer: Self-pay | Admitting: Cardiology

## 2016-05-18 VITALS — BP 122/82 | HR 78 | Ht 69.0 in | Wt 154.0 lb

## 2016-05-18 DIAGNOSIS — I2510 Atherosclerotic heart disease of native coronary artery without angina pectoris: Secondary | ICD-10-CM

## 2016-05-18 DIAGNOSIS — I10 Essential (primary) hypertension: Secondary | ICD-10-CM

## 2016-05-18 DIAGNOSIS — E782 Mixed hyperlipidemia: Secondary | ICD-10-CM

## 2016-05-18 DIAGNOSIS — I480 Paroxysmal atrial fibrillation: Secondary | ICD-10-CM

## 2016-05-18 NOTE — Patient Instructions (Signed)

## 2016-05-18 NOTE — Progress Notes (Signed)
Clinical Summary [name] is a 83 y.o.male seen today for follow up of the following medical problems.    1. CAD  - evidence of prior infarct from imaging (echo and MPI), never had an acute clinical event or cath.  - LVEF 60-65% by echo 11/2009   - denies any recent chest pain. No SOB or DOE - he is compliant with meds  2. Paroxysmal afib  - changed to eliquis earlier this year after recurrent admissions with supratherapeutic INR and bleeding. Has done well on eliquis.  - no recent palpitations - no bleeding on eliquis.   3. HTN  - does not check at home regularly - reports pcp changed norvasc to 2.5mg  daily recently  4. Hyperlipidemia  - compliant with statin  - he reports upcoming labs with pcp.   5. CKD III - followed by pcp      Past Medical History:  Diagnosis Date  . Arteriosclerotic cardiovascular disease (ASCVD)    prior MI by echocardiography; stress nuclear -inferior scar; no prior cath  . Benign prostatic hypertrophy    s/p transurethral resection of the prostate  . Chronic kidney disease    Creatinine of 1.5 in 11/03 and 1.8 in 10/08; 1.75 in 09/2009  . Diabetes mellitus type 2, insulin dependent (HCC)   . Hyperlipidemia   . Hypertension   . Nephrolithiasis    surgical stone extractionx2  . Paroxysmal atrial fibrillation (HCC)  10/2009   onset in 10/2009; anticoagulation  . Tobacco abuse, in remission    25 pack years, discontinued in 1990     No Known Allergies   Current Outpatient Prescriptions  Medication Sig Dispense Refill  . acetaminophen (TYLENOL) 325 MG tablet Take 650 mg by mouth every 6 (six) hours as needed for mild pain or moderate pain.    Marland Kitchen. amLODipine (NORVASC) 2.5 MG tablet Take 2.5 mg by mouth daily.    Marland Kitchen. apixaban (ELIQUIS) 2.5 MG TABS tablet Take 1 tablet (2.5 mg total) by mouth 2 (two) times daily. 60 tablet   . chlorthalidone (HYGROTON) 25 MG tablet Take 25 mg by mouth daily.    . Insulin Glargine (LANTUS  SOLOSTAR) 100 UNIT/ML Solostar Pen Inject 20 Units into the skin daily.    . metoprolol (LOPRESSOR) 50 MG tablet Take 50 mg by mouth 2 (two) times daily.      . simvastatin (ZOCOR) 20 MG tablet Take 20 mg by mouth daily.  3   No current facility-administered medications for this visit.      Past Surgical History:  Procedure Laterality Date  . Surgical removal of renal calculi     3  . TRANSURETHRAL RESECTION OF PROSTATE       No Known Allergies    Family History  Problem Relation Age of Onset  . Diabetes Mother   . Heart attack Father   . Heart disease Sister   . Diabetes Sister      Social History [name] reports that he quit smoking about 28 years ago. His smoking use included Cigarettes. He started smoking about 60 years ago. He has a 20.00 pack-year smoking history. He has never used smokeless tobacco. [name] reports that he drinks alcohol.   Review of Systems CONSTITUTIONAL: No weight loss, fever, chills, weakness or fatigue.  HEENT: Eyes: No visual loss, blurred vision, double vision or yellow sclerae.No hearing loss, sneezing, congestion, runny nose or sore throat.  SKIN: No rash or itching.  CARDIOVASCULAR: per hpi RESPIRATORY:  No shortness of breath, cough or sputum.  GASTROINTESTINAL: No anorexia, nausea, vomiting or diarrhea. No abdominal pain or blood.  GENITOURINARY: No burning on urination, no polyuria NEUROLOGICAL: No headache, dizziness, syncope, paralysis, ataxia, numbness or tingling in the extremities. No change in bowel or bladder control.  MUSCULOSKELETAL: No muscle, back pain, joint pain or stiffness.  LYMPHATICS: No enlarged nodes. No history of splenectomy.  PSYCHIATRIC: No history of depression or anxiety.  ENDOCRINOLOGIC: No reports of sweating, cold or heat intolerance. No polyuria or polydipsia.  Marland Kitchen   Physical Examination Vitals:   05/18/16 0823  BP: 122/82  Pulse: 78   Filed Weights   05/18/16 0823  Weight: 154 lb (69.9  kg)    Gen: resting comfortably, no acute distress HEENT: no scleral icterus, pupils equal round and reactive, no palptable cervical adenopathy,  CV: RRR, no m/r/g, no jvd Resp: Clear to auscultation bilaterally GI: abdomen is soft, non-tender, non-distended, normal bowel sounds, no hepatosplenomegaly MSK: extremities are warm, no edema.  Skin: warm, no rash Neuro:  no focal deficits Psych: appropriate affect   Diagnostic Studies 05/2001 MPI THE LEFT VENTRICLE WAS MILDLY DILATED. ON TOMOGRAPHIC IMAGES RECONSTRUCTED IN  STANDARD PLANES, THERE WAS A MILD DEFECT INVOLVING THE INFERIOR AND INFEROLATERAL MYOCARDIUM THAT  DEMONSTRATED NO REVERSIBILITY WHEN COMPARED TO REST IMAGES. THE GATED 3-D RECONSTRUCTION REVEALED  GLOBAL HYPOKINESIS WITH AN ESTIMATED EJECTION FRACTION OF .38. ON THE GATED TOMOGRAMS, THERE WAS  DECREASED SYSTOLIC ACCENTUATION OF ACTIVITY IN THE INFERIOR WALL. IMPRESSION ABNORMAL STRESS CARDIOLITE STUDY WITH IMPAIRED EXERCISE CAPACITY, A  NEGATIVE STRESS EKG, MILD LEFT VENTRICULAR DILATATION WITH MILD TO MODERATE GLOBAL SYSTOLIC DYSFUNCTION;  SCINTIGRAPHIC ABNORMALITIES MAY BE ARTIFACTUAL DUE TO DIAPHRAGMATIC ATTENUATION. NO  MYOCARDIAL ISCHEMIA IDENTIFIED. OTHER FINDINGS AS NOTED.   11/2009 Echo LVEF 60-65%, cannot evaluate diastolic function.     Assessment and Plan  1. CAD  - presumed CAD based on evidence of infarct by imaging, no prior clinical event  -no recent symptoms -  continue current meds  2. Afib  - no current symptoms   CHADS2Vasc score is 4, continue elqiuis  3. HTN  - bp is at goal, continue current meds   4. Hyperlipidemia  - continue current statin - request lipid panel from pcp  F/u 6 months     Antoine Poche, M.D

## 2016-06-08 DIAGNOSIS — L0390 Cellulitis, unspecified: Secondary | ICD-10-CM | POA: Diagnosis not present

## 2016-06-22 DIAGNOSIS — I10 Essential (primary) hypertension: Secondary | ICD-10-CM | POA: Diagnosis not present

## 2016-06-22 DIAGNOSIS — E118 Type 2 diabetes mellitus with unspecified complications: Secondary | ICD-10-CM | POA: Diagnosis not present

## 2016-06-22 DIAGNOSIS — L0390 Cellulitis, unspecified: Secondary | ICD-10-CM | POA: Diagnosis not present

## 2016-06-22 DIAGNOSIS — I480 Paroxysmal atrial fibrillation: Secondary | ICD-10-CM | POA: Diagnosis not present

## 2016-09-07 DIAGNOSIS — E118 Type 2 diabetes mellitus with unspecified complications: Secondary | ICD-10-CM | POA: Diagnosis not present

## 2016-09-07 DIAGNOSIS — Z125 Encounter for screening for malignant neoplasm of prostate: Secondary | ICD-10-CM | POA: Diagnosis not present

## 2016-09-07 DIAGNOSIS — I10 Essential (primary) hypertension: Secondary | ICD-10-CM | POA: Diagnosis not present

## 2016-09-11 DIAGNOSIS — I10 Essential (primary) hypertension: Secondary | ICD-10-CM | POA: Diagnosis not present

## 2016-09-11 DIAGNOSIS — E118 Type 2 diabetes mellitus with unspecified complications: Secondary | ICD-10-CM | POA: Diagnosis not present

## 2016-09-11 DIAGNOSIS — E785 Hyperlipidemia, unspecified: Secondary | ICD-10-CM | POA: Diagnosis not present

## 2016-09-15 IMAGING — CT CT ABD-PELV W/O CM
2 of 4 series · 17 of 46 positions shown, 19 images · non-contrast
Comparison: None.

CLINICAL DATA: Anemia. On anticoagulation. Evaluate for hemorrhage.

EXAM:
CT ABDOMEN AND PELVIS WITHOUT CONTRAST
TECHNIQUE: Multidetector CT imaging of the abdomen and pelvis was performed
following the standard protocol without IV contrast.

[Series 2: abdomen/pelvis w/o contrast · axial · non-contrast · 0.84mm/px · z∈[-496,-56]mm · 14 of 96 slices shown, 16 images]
[im 4/96  soft-tissue]
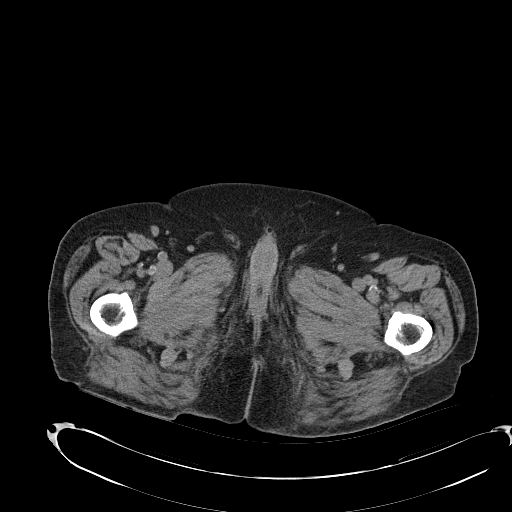
[im 4/96  bone]
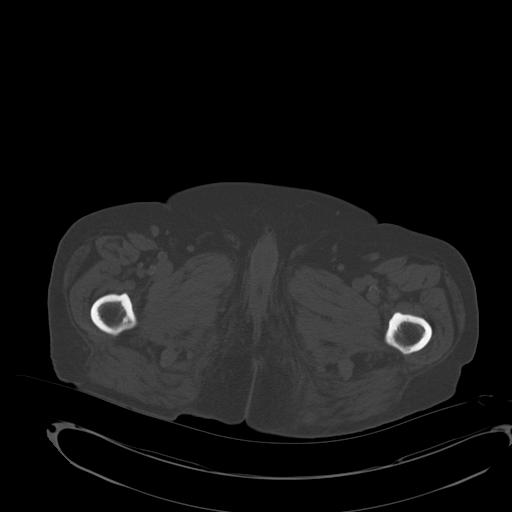
[im 12/96  soft-tissue]
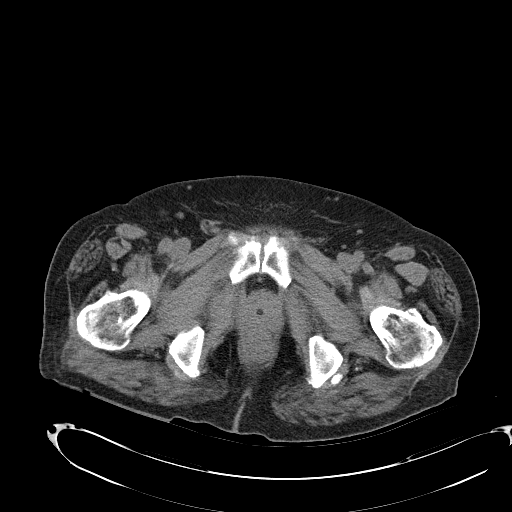
[im 20/96  soft-tissue]
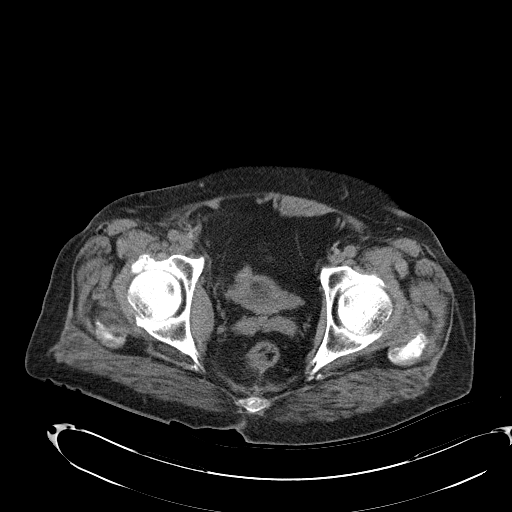
[im 24/96  soft-tissue]
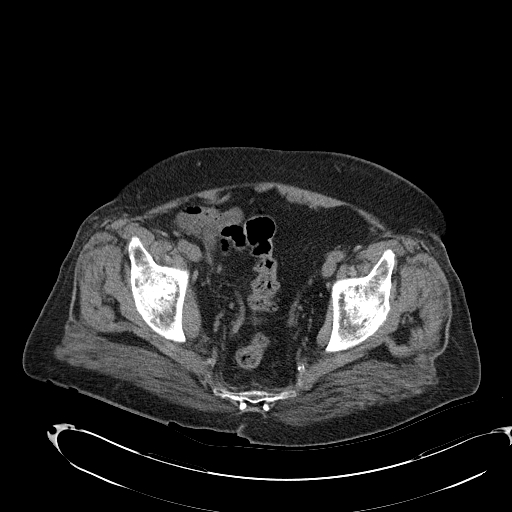
[im 32/96  soft-tissue]
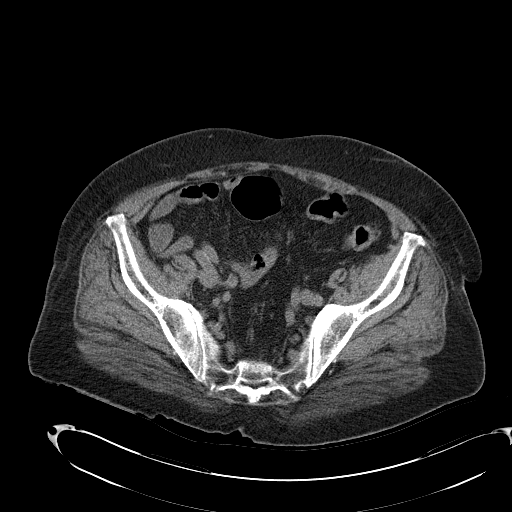
[im 40/96  soft-tissue]
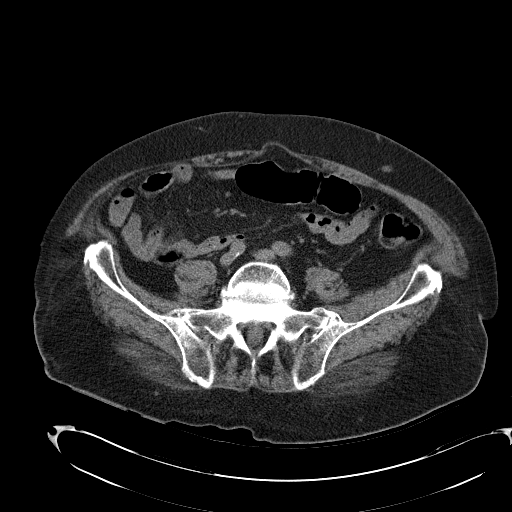
[im 44/96  soft-tissue]
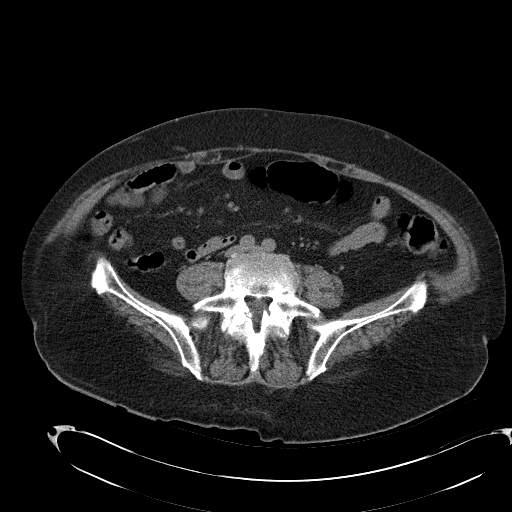
[im 52/96  soft-tissue]
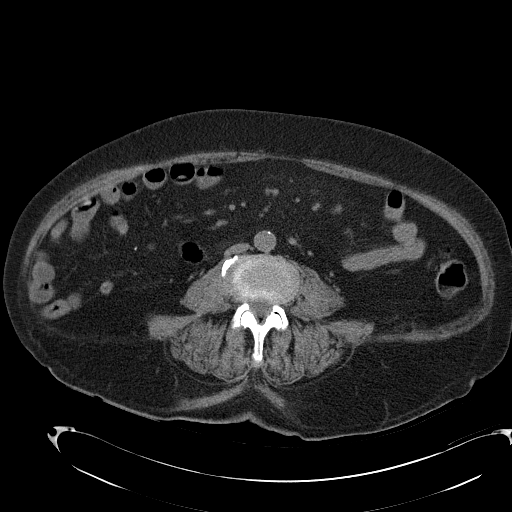
[im 56/96  soft-tissue]
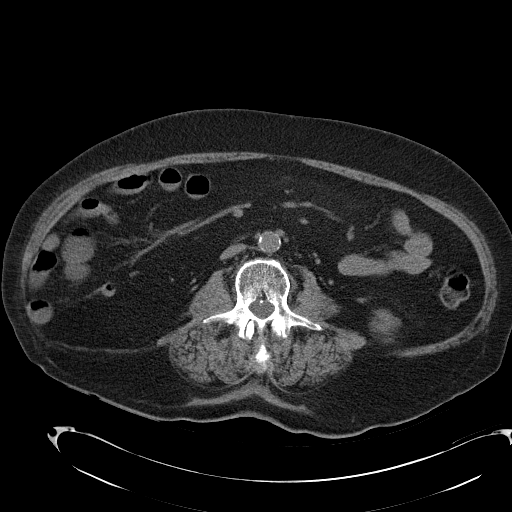
[im 56/96  bone]
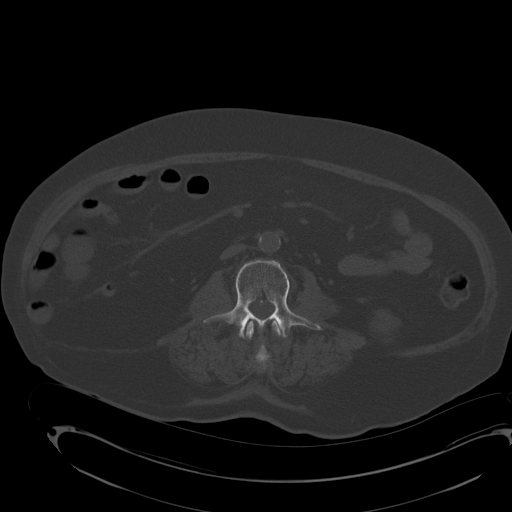
[im 64/96  soft-tissue]
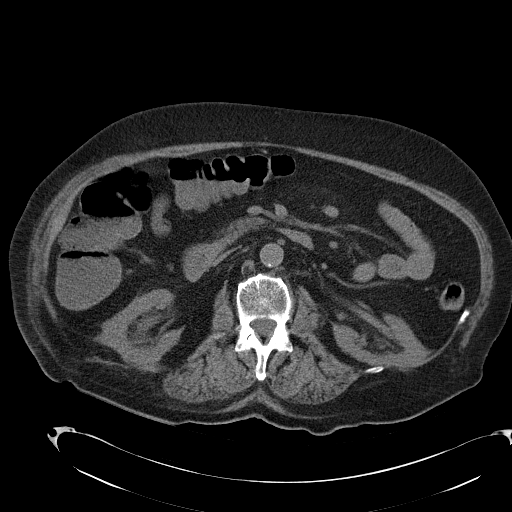
[im 72/96  soft-tissue]
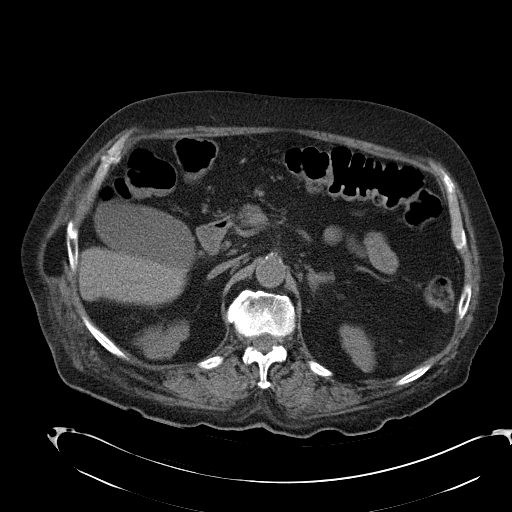
[im 76/96  soft-tissue]
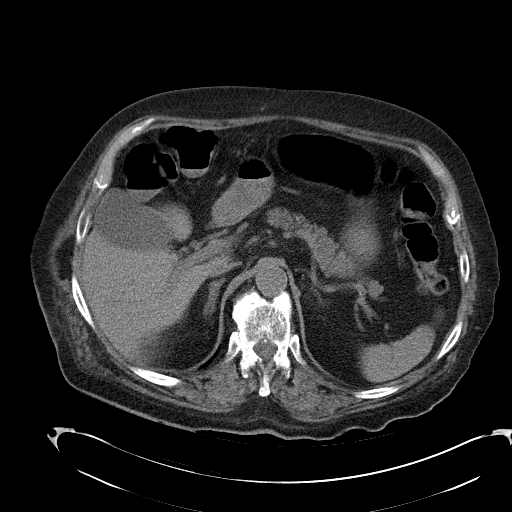
[im 84/96  soft-tissue]
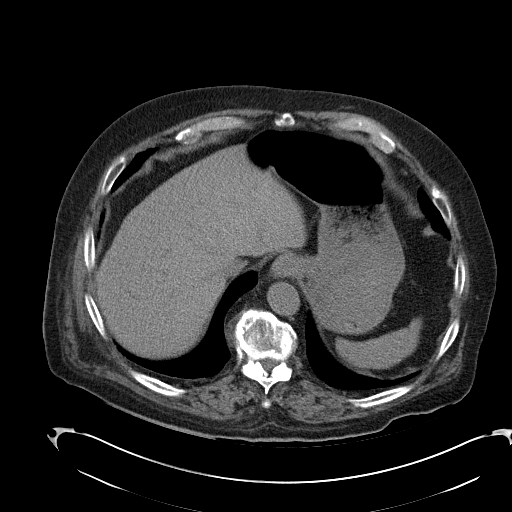
[im 92/96  soft-tissue]
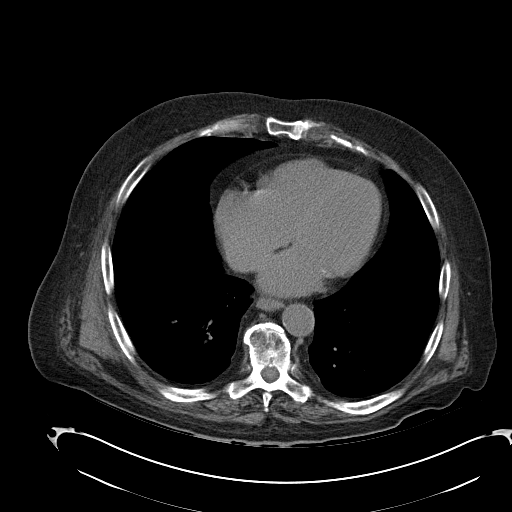

[Series 4: mpr cor (id) · coronal · 0.89mm/px · 3 of 98 slices shown]
[im 33/98  soft-tissue]
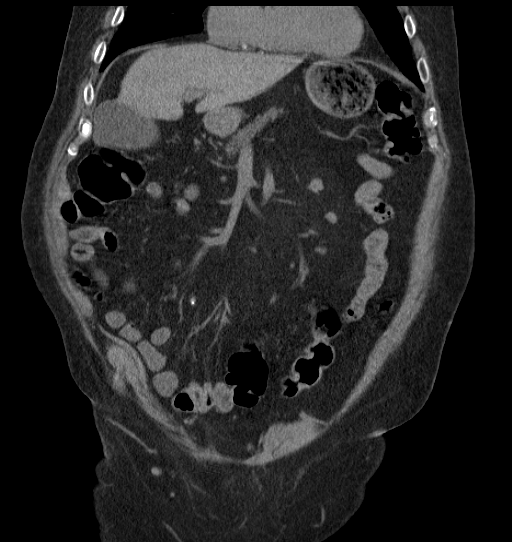
[im 44/98  soft-tissue]
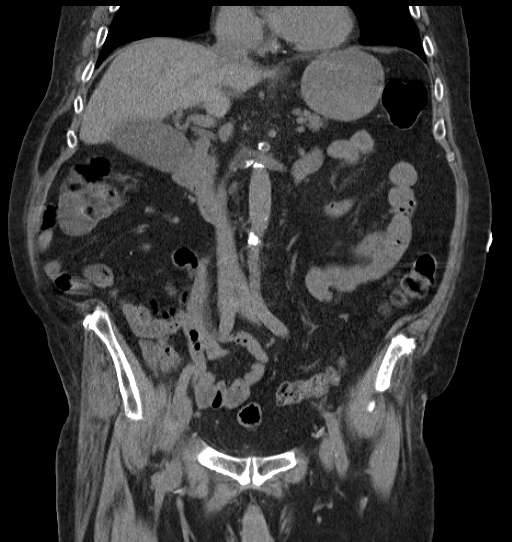
[im 54/98  soft-tissue]
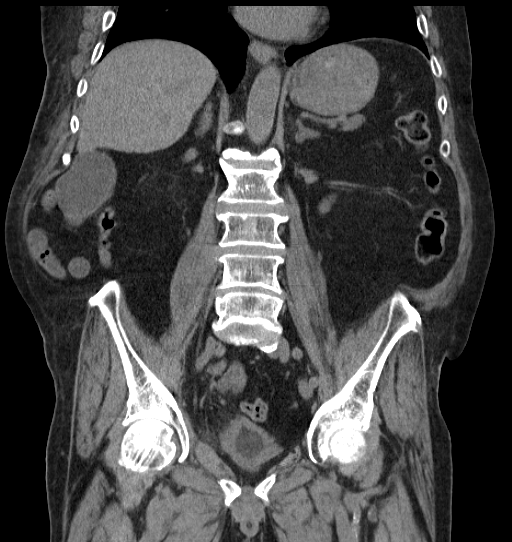

[17 of 46 positions shown; findings below may reference images not displayed]

FINDINGS: Lower chest:  No acute findings.

Hepatobiliary: No mass visualized on this un-enhanced exam.
Gallbladder is unremarkable.

Pancreas: No mass or inflammatory process identified on this
un-enhanced exam.

Spleen: Within normal limits in size.

Adrenals/Urinary Tract: No evidence of urolithiasis or
hydronephrosis. Mild bilateral renal parenchymal scarring noted. No
definite mass visualized on this un-enhanced exam. Urinary bladder
is empty with Foley catheter in place.

Stomach/Bowel: No evidence of obstruction, inflammatory process, or
abnormal fluid collections. Normal appendix visualized.

Vascular/Lymphatic: No pathologically enlarged lymph nodes. No
evidence of abdominal aortic aneurysm. Aortic atherosclerotic plaque
noted.

Reproductive: No mass or other significant abnormality.

Other: No evidence of hemoperitoneum or retroperitoneal hemorrhage.

Musculoskeletal: Degenerative lumbar spondylosis noted. Bilateral L5
pars defects seen with grade 1 anterolisthesis at L5-S1.
IMPRESSION: No evidence retroperitoneal hemorrhage, hemoperitoneum, or other
acute findings.

## 2016-11-23 ENCOUNTER — Encounter: Payer: Self-pay | Admitting: Cardiology

## 2016-11-23 ENCOUNTER — Ambulatory Visit (INDEPENDENT_AMBULATORY_CARE_PROVIDER_SITE_OTHER): Payer: Medicare Other | Admitting: Cardiology

## 2016-11-23 VITALS — BP 122/62 | HR 78 | Ht 67.0 in | Wt 165.0 lb

## 2016-11-23 DIAGNOSIS — I2510 Atherosclerotic heart disease of native coronary artery without angina pectoris: Secondary | ICD-10-CM | POA: Diagnosis not present

## 2016-11-23 DIAGNOSIS — I10 Essential (primary) hypertension: Secondary | ICD-10-CM

## 2016-11-23 DIAGNOSIS — I480 Paroxysmal atrial fibrillation: Secondary | ICD-10-CM | POA: Diagnosis not present

## 2016-11-23 DIAGNOSIS — Z23 Encounter for immunization: Secondary | ICD-10-CM | POA: Diagnosis not present

## 2016-11-23 DIAGNOSIS — E782 Mixed hyperlipidemia: Secondary | ICD-10-CM | POA: Diagnosis not present

## 2016-11-23 NOTE — Progress Notes (Signed)
Clinical Summary Matthew Boyd is a 83 y.o.male seen today for follow up of the following medical problems.    1. CAD  - evidence of prior infarct from imaging (echo and MPI), never had an acute clinical event or cath.  - LVEF 60-65% by echo 11/2009   - no recent SOB/DOE. No recent chest pain - compliant with meds   2. Paroxysmal afib  - changed to eliquis earlier this year after recurrentadmissionswith supratherapeutic INR and bleeding. Has done well on eliquis.    - no recent palpitatins. Significant increase in cost of eliquis recently since entering the donut hole. Still able to afford for now.   3. HTN  - compliant with meds  4. Hyperlipidemia  - compliant with statin  - 04/2016 TC 146 TG 86 LDL 75  5. CKD III - followed by pcp    Past Medical History:  Diagnosis Date  . Arteriosclerotic cardiovascular disease (ASCVD)    prior MI by echocardiography; stress nuclear -inferior scar; no prior cath  . Benign prostatic hypertrophy    s/p transurethral resection of the prostate  . Chronic kidney disease    Creatinine of 1.5 in 11/03 and 1.8 in 10/08; 1.75 in 09/2009  . Diabetes mellitus type 2, insulin dependent (HCC)   . Hyperlipidemia   . Hypertension   . Nephrolithiasis    surgical stone extractionx2  . Paroxysmal atrial fibrillation (HCC)  10/2009   onset in 10/2009; anticoagulation  . Tobacco abuse, in remission    25 pack years, discontinued in 1990     No Known Allergies   Current Outpatient Prescriptions  Medication Sig Dispense Refill  . acetaminophen (TYLENOL) 325 MG tablet Take 650 mg by mouth every 6 (six) hours as needed for mild pain or moderate pain.    Marland Kitchen amLODipine (NORVASC) 2.5 MG tablet Take 2.5 mg by mouth daily.    Marland Kitchen apixaban (ELIQUIS) 2.5 MG TABS tablet Take 1 tablet (2.5 mg total) by mouth 2 (two) times daily. 60 tablet   . chlorthalidone (HYGROTON) 25 MG tablet Take 25 mg by mouth daily.    . Insulin Glargine (LANTUS  SOLOSTAR) 100 UNIT/ML Solostar Pen Inject 20 Units into the skin daily.    . metoprolol (LOPRESSOR) 50 MG tablet Take 50 mg by mouth 2 (two) times daily.      . simvastatin (ZOCOR) 20 MG tablet Take 20 mg by mouth daily.  3   No current facility-administered medications for this visit.      Past Surgical History:  Procedure Laterality Date  . Surgical removal of renal calculi     3  . TRANSURETHRAL RESECTION OF PROSTATE       No Known Allergies    Family History  Problem Relation Age of Onset  . Diabetes Mother   . Heart attack Father   . Heart disease Sister   . Diabetes Sister      Social History Matthew Boyd reports that he quit smoking about 28 years ago. His smoking use included Cigarettes. He started smoking about 61 years ago. He has a 20.00 pack-year smoking history. He has never used smokeless tobacco. Matthew Boyd reports that he drinks alcohol.   Review of Systems CONSTITUTIONAL: No weight loss, fever, chills, weakness or fatigue.  HEENT: Eyes: No visual loss, blurred vision, double vision or yellow sclerae.No hearing loss, sneezing, congestion, runny nose or sore throat.  SKIN: No rash or itching.  CARDIOVASCULAR: per hpi RESPIRATORY: No shortness of breath,  cough or sputum.  GASTROINTESTINAL: No anorexia, nausea, vomiting or diarrhea. No abdominal pain or blood.  GENITOURINARY: No burning on urination, no polyuria NEUROLOGICAL: No headache, dizziness, syncope, paralysis, ataxia, numbness or tingling in the extremities. No change in bowel or bladder control.  MUSCULOSKELETAL: No muscle, back pain, joint pain or stiffness.  LYMPHATICS: No enlarged nodes. No history of splenectomy.  PSYCHIATRIC: No history of depression or anxiety.  ENDOCRINOLOGIC: No reports of sweating, cold or heat intolerance. No polyuria or polydipsia.  Marland Kitchen   Physical Examination Vitals:   11/23/16 0946  BP: 122/62  Pulse: 78  SpO2: 98%   Vitals:   11/23/16 0946  Weight: 165 lb  (74.8 kg)  Height:  (1.702 m)    Gen: resting comfortably, no acute distress HEENT: no scleral icterus, pupils equal round and reactive, no palptable cervical adenopathy,  CV: RRR, no m/r/g, no jvd Resp: Clear to auscultation bilaterally GI: abdomen is soft, non-tender, non-distended, normal bowel sounds, no hepatosplenomegaly MSK: extremities are warm, no edema.  Skin: warm, no rash Neuro:  no focal deficits Psych: appropriate affect   Diagnostic Studies 05/2001 MPI THE LEFT VENTRICLE WAS MILDLY DILATED. ON TOMOGRAPHIC IMAGES RECONSTRUCTED IN  STANDARD PLANES, THERE WAS A MILD DEFECT INVOLVING THE INFERIOR AND INFEROLATERAL MYOCARDIUM THAT  DEMONSTRATED NO REVERSIBILITY WHEN COMPARED TO REST IMAGES. THE GATED 3-D RECONSTRUCTION REVEALED  GLOBAL HYPOKINESIS WITH AN ESTIMATED EJECTION FRACTION OF .38. ON THE GATED TOMOGRAMS, THERE WAS  DECREASED SYSTOLIC ACCENTUATION OF ACTIVITY IN THE INFERIOR WALL. IMPRESSION ABNORMAL STRESS CARDIOLITE STUDY WITH IMPAIRED EXERCISE CAPACITY, A  NEGATIVE STRESS EKG, MILD LEFT VENTRICULAR DILATATION WITH MILD TO MODERATE GLOBAL SYSTOLIC DYSFUNCTION;  SCINTIGRAPHIC ABNORMALITIES MAY BE ARTIFACTUAL DUE TO DIAPHRAGMATIC ATTENUATION. NO  MYOCARDIAL ISCHEMIA IDENTIFIED. OTHER FINDINGS AS NOTED.   11/2009 Echo LVEF 60-65%, cannot evaluate diastolic function.      Assessment and Plan  1. CAD  - presumed CAD based on evidence of infarct by imaging, no prior clinical event  -no recent symptoms, continue current meds  2. Afib   CHADS2Vasc score is 4, he will continue anticoagulation - no recent symptoms.   3. HTN  - at goal, continue current meds  4. Hyperlipidemia  - well controlled, continue statin. Has been on simva 20 for many years and tolerated, we have continued.   F/u 6 months      Antoine Poche, M.D.

## 2016-11-23 NOTE — Patient Instructions (Signed)

## 2017-01-09 ENCOUNTER — Telehealth: Payer: Self-pay

## 2017-01-09 NOTE — Telephone Encounter (Signed)
Pt walked in office stating he is needing samples of Eliquis 2.4 mg BID as he is in the donut hole and he cannot afford it this month. Pt given samples  Lot # [id number] EXP FEB 2019

## 2017-03-04 DIAGNOSIS — Z125 Encounter for screening for malignant neoplasm of prostate: Secondary | ICD-10-CM | POA: Diagnosis not present

## 2017-03-04 DIAGNOSIS — E118 Type 2 diabetes mellitus with unspecified complications: Secondary | ICD-10-CM | POA: Diagnosis not present

## 2017-03-04 DIAGNOSIS — I10 Essential (primary) hypertension: Secondary | ICD-10-CM | POA: Diagnosis not present

## 2017-03-12 DIAGNOSIS — E785 Hyperlipidemia, unspecified: Secondary | ICD-10-CM | POA: Diagnosis not present

## 2017-03-12 DIAGNOSIS — Z0001 Encounter for general adult medical examination with abnormal findings: Secondary | ICD-10-CM | POA: Diagnosis not present

## 2017-03-12 DIAGNOSIS — E118 Type 2 diabetes mellitus with unspecified complications: Secondary | ICD-10-CM | POA: Diagnosis not present

## 2017-03-12 DIAGNOSIS — I10 Essential (primary) hypertension: Secondary | ICD-10-CM | POA: Diagnosis not present

## 2017-03-12 DIAGNOSIS — I480 Paroxysmal atrial fibrillation: Secondary | ICD-10-CM | POA: Diagnosis not present

## 2017-03-27 ENCOUNTER — Other Ambulatory Visit (HOSPITAL_COMMUNITY): Payer: Self-pay | Admitting: Family Medicine

## 2017-03-27 DIAGNOSIS — Z79899 Other long term (current) drug therapy: Secondary | ICD-10-CM

## 2017-04-10 ENCOUNTER — Ambulatory Visit (HOSPITAL_COMMUNITY)
Admission: RE | Admit: 2017-04-10 | Discharge: 2017-04-10 | Disposition: A | Discharge status: Home / Self Care | Payer: Medicare Other | Source: Ambulatory Visit | Attending: Family Medicine | Admitting: Family Medicine

## 2017-04-10 DIAGNOSIS — M8580 Other specified disorders of bone density and structure, unspecified site: Secondary | ICD-10-CM | POA: Diagnosis not present

## 2017-04-10 DIAGNOSIS — Z13820 Encounter for screening for osteoporosis: Secondary | ICD-10-CM | POA: Insufficient documentation

## 2017-04-10 DIAGNOSIS — Z79899 Other long term (current) drug therapy: Secondary | ICD-10-CM

## 2017-04-10 DIAGNOSIS — M85852 Other specified disorders of bone density and structure, left thigh: Secondary | ICD-10-CM | POA: Diagnosis not present

## 2017-05-14 DIAGNOSIS — E118 Type 2 diabetes mellitus with unspecified complications: Secondary | ICD-10-CM | POA: Diagnosis not present

## 2017-05-14 DIAGNOSIS — L853 Xerosis cutis: Secondary | ICD-10-CM | POA: Diagnosis not present

## 2017-05-14 DIAGNOSIS — L209 Atopic dermatitis, unspecified: Secondary | ICD-10-CM | POA: Diagnosis not present

## 2017-05-14 DIAGNOSIS — Z79899 Other long term (current) drug therapy: Secondary | ICD-10-CM | POA: Diagnosis not present

## 2017-05-14 DIAGNOSIS — R600 Localized edema: Secondary | ICD-10-CM | POA: Diagnosis not present

## 2017-06-03 ENCOUNTER — Encounter: Payer: Self-pay | Admitting: Cardiology

## 2017-06-03 ENCOUNTER — Ambulatory Visit: Payer: Medicare Other | Admitting: Cardiology

## 2017-06-03 VITALS — BP 118/64 | HR 54 | Ht 69.0 in | Wt 177.0 lb

## 2017-06-03 DIAGNOSIS — I2510 Atherosclerotic heart disease of native coronary artery without angina pectoris: Secondary | ICD-10-CM | POA: Diagnosis not present

## 2017-06-03 DIAGNOSIS — E782 Mixed hyperlipidemia: Secondary | ICD-10-CM | POA: Diagnosis not present

## 2017-06-03 DIAGNOSIS — I10 Essential (primary) hypertension: Secondary | ICD-10-CM

## 2017-06-03 DIAGNOSIS — N183 Chronic kidney disease, stage 3 unspecified: Secondary | ICD-10-CM

## 2017-06-03 DIAGNOSIS — I4891 Unspecified atrial fibrillation: Secondary | ICD-10-CM | POA: Diagnosis not present

## 2017-06-03 NOTE — Progress Notes (Signed)
Clinical Summary Mr. Thivierge is a 84 y.o.male today for follow up of the following medical problems.    1. CAD  - evidence of prior infarct from imaging (echo and MPI), never had an acute clinical event or cath.  - LVEF 60-65% by echo 11/2009    - no recent chest pain or SOB    2. Paroxysmal afib  - changed to eliquis after recurrentadmissionswith supratherapeutic INR and bleeding. Has done well on eliquis.   - no recent palpitatons, no bleeding on anticoag - compliant with meds  3. HTN  - he is compliant witgh meds  4. Hyperlipidemia  - compliant with statin  - 04/2016 TC 146 TG 86 LDL 75  - last labs with pcp in Jan 2019 not available at this time.  5. CKD III - recent labs by pcp      Past Medical History:  Diagnosis Date  . Arteriosclerotic cardiovascular disease (ASCVD)    prior MI by echocardiography; stress nuclear -inferior scar; no prior cath  . Benign prostatic hypertrophy    s/p transurethral resection of the prostate  . Chronic kidney disease    Creatinine of 1.5 in 11/03 and 1.8 in 10/08; 1.75 in 09/2009  . Diabetes mellitus type 2, insulin dependent (HCC)   . Hyperlipidemia   . Hypertension   . Nephrolithiasis    surgical stone extractionx2  . Paroxysmal atrial fibrillation (HCC)  10/2009   onset in 10/2009; anticoagulation  . Tobacco abuse, in remission    25 pack years, discontinued in 1990     No Known Allergies   Current Outpatient Medications  Medication Sig Dispense Refill  . acetaminophen (TYLENOL) 325 MG tablet Take 650 mg by mouth every 6 (six) hours as needed for mild pain or moderate pain.    Marland Kitchen amLODipine (NORVASC) 2.5 MG tablet Take 2.5 mg by mouth daily.    Marland Kitchen apixaban (ELIQUIS) 2.5 MG TABS tablet Take 1 tablet (2.5 mg total) by mouth 2 (two) times daily. 60 tablet   . chlorthalidone (HYGROTON) 25 MG tablet Take 25 mg by mouth daily.    . Insulin Glargine (LANTUS SOLOSTAR) 100 UNIT/ML Solostar Pen Inject  20 Units into the skin daily.    . metoprolol (LOPRESSOR) 50 MG tablet Take 50 mg by mouth 2 (two) times daily.      . simvastatin (ZOCOR) 20 MG tablet Take 20 mg by mouth daily.  3   No current facility-administered medications for this visit.      Past Surgical History:  Procedure Laterality Date  . Surgical removal of renal calculi     3  . TRANSURETHRAL RESECTION OF PROSTATE       No Known Allergies    Family History  Problem Relation Age of Onset  . Diabetes Mother   . Heart attack Father   . Heart disease Sister   . Diabetes Sister      Social History Mr. Harriger reports that he quit smoking about 29 years ago. His smoking use included cigarettes. He started smoking about 61 years ago. He has a 20.00 pack-year smoking history. He has never used smokeless tobacco. Mr. Junious reports that he drinks alcohol.   Review of Systems CONSTITUTIONAL: No weight loss, fever, chills, weakness or fatigue.  HEENT: Eyes: No visual loss, blurred vision, double vision or yellow sclerae.No hearing loss, sneezing, congestion, runny nose or sore throat.  SKIN: No rash or itching.  CARDIOVASCULAR: per hpi RESPIRATORY: No shortness of breath,  cough or sputum.  GASTROINTESTINAL: No anorexia, nausea, vomiting or diarrhea. No abdominal pain or blood.  GENITOURINARY: No burning on urination, no polyuria NEUROLOGICAL: No headache, dizziness, syncope, paralysis, ataxia, numbness or tingling in the extremities. No change in bowel or bladder control.  MUSCULOSKELETAL: No muscle, back pain, joint pain or stiffness.  LYMPHATICS: No enlarged nodes. No history of splenectomy.  PSYCHIATRIC: No history of depression or anxiety.  ENDOCRINOLOGIC: No reports of sweating, cold or heat intolerance. No polyuria or polydipsia.  Marland Kitchen.   Physical Examination Vitals:   06/03/17 0830  BP: 118/64  Pulse: (!) 54  SpO2: 94%   Vitals:   06/03/17 0830  Weight: 177 lb (80.3 kg)  Height: 5\' 9"  (1.753 m)     Gen: resting comfortably, no acute distress HEENT: no scleral icterus, pupils equal round and reactive, no palptable cervical adenopathy,  CV: irreg, no m/r/g, no jvd Resp: Clear to auscultation bilaterally GI: abdomen is soft, non-tender, non-distended, normal bowel sounds, no hepatosplenomegaly MSK: extremities are warm, no edema.  Skin: warm, no rash Neuro:  no focal deficits Psych: appropriate affect   Diagnostic Studies 05/2001 MPI THE LEFT VENTRICLE WAS MILDLY DILATED. ON TOMOGRAPHIC IMAGES RECONSTRUCTED IN  STANDARD PLANES, THERE WAS A MILD DEFECT INVOLVING THE INFERIOR AND INFEROLATERAL MYOCARDIUM THAT  DEMONSTRATED NO REVERSIBILITY WHEN COMPARED TO REST IMAGES. THE GATED 3-D RECONSTRUCTION REVEALED  GLOBAL HYPOKINESIS WITH AN ESTIMATED EJECTION FRACTION OF .38. ON THE GATED TOMOGRAMS, THERE WAS  DECREASED SYSTOLIC ACCENTUATION OF ACTIVITY IN THE INFERIOR WALL. IMPRESSION ABNORMAL STRESS CARDIOLITE STUDY WITH IMPAIRED EXERCISE CAPACITY, A  NEGATIVE STRESS EKG, MILD LEFT VENTRICULAR DILATATION WITH MILD TO MODERATE GLOBAL SYSTOLIC DYSFUNCTION;  SCINTIGRAPHIC ABNORMALITIES MAY BE ARTIFACTUAL DUE TO DIAPHRAGMATIC ATTENUATION. NO  MYOCARDIAL ISCHEMIA IDENTIFIED. OTHER FINDINGS AS NOTED.   11/2009 Echo LVEF 60-65%, cannot evaluate diastolic function.      Assessment and Plan  1. CAD  - presumed CAD based on evidence of infarct by imaging, no prior clinical event  - no symptoms, continue current meds  2. Afib  CHADS2Vasc score is 4, he will continue anticoagulation -no symptoms, he will continue current meds EKG in clinic today shows rate controlled afib   3. HTN  - bp at goal, continue current therapy.   4. Hyperlipidemia  - Has been on simva 20 for many years and tolerated, we have continued.  - request labs from pcp  5. CKD III - renall dose meds, request labs from pcp  F/u 6 months        Antoine PocheJonathan F. Danyel Griess, M.D.

## 2017-06-03 NOTE — Patient Instructions (Signed)

## 2017-09-06 DIAGNOSIS — I10 Essential (primary) hypertension: Secondary | ICD-10-CM | POA: Diagnosis not present

## 2017-09-06 DIAGNOSIS — E785 Hyperlipidemia, unspecified: Secondary | ICD-10-CM | POA: Diagnosis not present

## 2017-09-06 DIAGNOSIS — E118 Type 2 diabetes mellitus with unspecified complications: Secondary | ICD-10-CM | POA: Diagnosis not present

## 2017-09-10 DIAGNOSIS — I10 Essential (primary) hypertension: Secondary | ICD-10-CM | POA: Diagnosis not present

## 2017-09-10 DIAGNOSIS — Z7901 Long term (current) use of anticoagulants: Secondary | ICD-10-CM | POA: Diagnosis not present

## 2017-09-10 DIAGNOSIS — I480 Paroxysmal atrial fibrillation: Secondary | ICD-10-CM | POA: Diagnosis not present

## 2017-09-10 DIAGNOSIS — E118 Type 2 diabetes mellitus with unspecified complications: Secondary | ICD-10-CM | POA: Diagnosis not present

## 2017-09-10 DIAGNOSIS — E7800 Pure hypercholesterolemia, unspecified: Secondary | ICD-10-CM | POA: Diagnosis not present

## 2017-09-23 DIAGNOSIS — S41111A Laceration without foreign body of right upper arm, initial encounter: Secondary | ICD-10-CM | POA: Diagnosis not present

## 2017-09-23 DIAGNOSIS — S61411A Laceration without foreign body of right hand, initial encounter: Secondary | ICD-10-CM | POA: Diagnosis not present

## 2017-09-23 DIAGNOSIS — W19XXXA Unspecified fall, initial encounter: Secondary | ICD-10-CM | POA: Diagnosis not present

## 2017-09-23 DIAGNOSIS — S51811A Laceration without foreign body of right forearm, initial encounter: Secondary | ICD-10-CM | POA: Diagnosis not present

## 2017-09-23 DIAGNOSIS — Z23 Encounter for immunization: Secondary | ICD-10-CM | POA: Diagnosis not present

## 2017-10-01 DIAGNOSIS — T148XXD Other injury of unspecified body region, subsequent encounter: Secondary | ICD-10-CM | POA: Diagnosis not present

## 2017-10-01 DIAGNOSIS — E118 Type 2 diabetes mellitus with unspecified complications: Secondary | ICD-10-CM | POA: Diagnosis not present

## 2017-10-01 DIAGNOSIS — W19XXXD Unspecified fall, subsequent encounter: Secondary | ICD-10-CM | POA: Diagnosis not present

## 2017-10-07 DIAGNOSIS — Z4802 Encounter for removal of sutures: Secondary | ICD-10-CM | POA: Diagnosis not present

## 2017-10-07 DIAGNOSIS — T148XXD Other injury of unspecified body region, subsequent encounter: Secondary | ICD-10-CM | POA: Diagnosis not present

## 2017-10-14 DIAGNOSIS — Z4802 Encounter for removal of sutures: Secondary | ICD-10-CM | POA: Diagnosis not present

## 2017-12-10 ENCOUNTER — Encounter: Payer: Self-pay | Admitting: Cardiology

## 2017-12-10 ENCOUNTER — Ambulatory Visit: Payer: Medicare Other | Admitting: Cardiology

## 2017-12-10 VITALS — BP 136/70 | HR 91 | Ht 69.0 in | Wt 184.0 lb

## 2017-12-10 DIAGNOSIS — Z23 Encounter for immunization: Secondary | ICD-10-CM | POA: Diagnosis not present

## 2017-12-10 DIAGNOSIS — E782 Mixed hyperlipidemia: Secondary | ICD-10-CM | POA: Diagnosis not present

## 2017-12-10 DIAGNOSIS — I10 Essential (primary) hypertension: Secondary | ICD-10-CM

## 2017-12-10 DIAGNOSIS — N183 Chronic kidney disease, stage 3 unspecified: Secondary | ICD-10-CM

## 2017-12-10 DIAGNOSIS — I2510 Atherosclerotic heart disease of native coronary artery without angina pectoris: Secondary | ICD-10-CM | POA: Diagnosis not present

## 2017-12-10 DIAGNOSIS — I4891 Unspecified atrial fibrillation: Secondary | ICD-10-CM | POA: Diagnosis not present

## 2017-12-10 NOTE — Progress Notes (Signed)
Clinical Summary Matthew Boyd is a 84 y.o.male seen today for follow up of the following medical problems.    1. CAD  - evidence of prior infarct from imaging (echo and MPI), never had an acute clinical event or cath.  - LVEF 60-65% by echo 11/2009     - no chest pain, no SOB or DOE - compliant with meds   2. Paroxysmal afib  - changed to eliquis after recurrentadmissionswith supratherapeutic INR and bleeding. Has done well on eliquis.   - no recent palpitations. No bleeding on eliquis. Has been on 2.5mg  bid dosing due to age and renal function  3. HTN  -compliant with meds  4. Hyperlipidemia  - compliant with statin  - 04/2016 TC 146 TG 86 LDL 75 Jan 2019 TC 169 HDL 52 LDL 97 - upcoming labs later this month.   5. CKD III - labs coming up later this month with pcp   Past Medical History:  Diagnosis Date  . Arteriosclerotic cardiovascular disease (ASCVD)    prior MI by echocardiography; stress nuclear -inferior scar; no prior cath  . Benign prostatic hypertrophy    s/p transurethral resection of the prostate  . Chronic kidney disease    Creatinine of 1.5 in 11/03 and 1.8 in 10/08; 1.75 in 09/2009  . Diabetes mellitus type 2, insulin dependent (HCC)   . Hyperlipidemia   . Hypertension   . Nephrolithiasis    surgical stone extractionx2  . Paroxysmal atrial fibrillation (HCC)  10/2009   onset in 10/2009; anticoagulation  . Tobacco abuse, in remission    25 pack years, discontinued in 1990     No Known Allergies   Current Outpatient Medications  Medication Sig Dispense Refill  . acetaminophen (TYLENOL) 325 MG tablet Take 650 mg by mouth every 6 (six) hours as needed for mild pain or moderate pain.    Marland Kitchen amLODipine (NORVASC) 2.5 MG tablet Take 2.5 mg by mouth daily.    Marland Kitchen apixaban (ELIQUIS) 2.5 MG TABS tablet Take 1 tablet (2.5 mg total) by mouth 2 (two) times daily. 60 tablet   . chlorthalidone (HYGROTON) 25 MG tablet Take 25 mg by mouth  daily.    . Insulin Glargine (LANTUS SOLOSTAR) 100 UNIT/ML Solostar Pen Inject 20 Units into the skin daily.    . metoprolol (LOPRESSOR) 50 MG tablet Take 50 mg by mouth 2 (two) times daily.      . simvastatin (ZOCOR) 20 MG tablet Take 20 mg by mouth daily.  3   No current facility-administered medications for this visit.      Past Surgical History:  Procedure Laterality Date  . Surgical removal of renal calculi     3  . TRANSURETHRAL RESECTION OF PROSTATE       No Known Allergies    Family History  Problem Relation Age of Onset  . Diabetes Mother   . Heart attack Father   . Heart disease Sister   . Diabetes Sister      Social History [name] reports that he quit smoking about 29 years ago. His smoking use included cigarettes. He started smoking about 62 years ago. He has a 20.00 pack-year smoking history. He has never used smokeless tobacco. [name] reports that he drinks alcohol.   Review of Systems CONSTITUTIONAL: No weight loss, fever, chills, weakness or fatigue.  HEENT: Eyes: No visual loss, blurred vision, double vision or yellow sclerae.No hearing loss, sneezing, congestion, runny nose or sore throat.  SKIN:  No rash or itching.  CARDIOVASCULAR: per hpi RESPIRATORY: No shortness of breath, cough or sputum.  GASTROINTESTINAL: No anorexia, nausea, vomiting or diarrhea. No abdominal pain or blood.  GENITOURINARY: No burning on urination, no polyuria NEUROLOGICAL: No headache, dizziness, syncope, paralysis, ataxia, numbness or tingling in the extremities. No change in bowel or bladder control.  MUSCULOSKELETAL: No muscle, back pain, joint pain or stiffness.  LYMPHATICS: No enlarged nodes. No history of splenectomy.  PSYCHIATRIC: No history of depression or anxiety.  ENDOCRINOLOGIC: No reports of sweating, cold or heat intolerance. No polyuria or polydipsia.  Marland Kitchen   Physical Examination Vitals:   12/10/17 0823  BP: 136/70  Pulse: 91  SpO2: 94%    Vitals:   12/10/17 0823  Weight: 184 lb (83.5 kg)  Height: 5\' 9"  (1.753 m)    Gen: resting comfortably, no acute distress HEENT: no scleral icterus, pupils equal round and reactive, no palptable cervical adenopathy,  CV: RRR, no m/r/g, no jvd Resp: Clear to auscultation bilaterally GI: abdomen is soft, non-tender, non-distended, normal bowel sounds, no hepatosplenomegaly MSK: extremities are warm, no edema.  Skin: warm, no rash Neuro:  no focal deficits Psych: appropriate affect   Diagnostic Studies 05/2001 MPI THE LEFT VENTRICLE WAS MILDLY DILATED. ON TOMOGRAPHIC IMAGES RECONSTRUCTED IN  STANDARD PLANES, THERE WAS A MILD DEFECT INVOLVING THE INFERIOR AND INFEROLATERAL MYOCARDIUM THAT  DEMONSTRATED NO REVERSIBILITY WHEN COMPARED TO REST IMAGES. THE GATED 3-D RECONSTRUCTION REVEALED  GLOBAL HYPOKINESIS WITH AN ESTIMATED EJECTION FRACTION OF .38. ON THE GATED TOMOGRAMS, THERE WAS  DECREASED SYSTOLIC ACCENTUATION OF ACTIVITY IN THE INFERIOR WALL. IMPRESSION ABNORMAL STRESS CARDIOLITE STUDY WITH IMPAIRED EXERCISE CAPACITY, A  NEGATIVE STRESS EKG, MILD LEFT VENTRICULAR DILATATION WITH MILD TO MODERATE GLOBAL SYSTOLIC DYSFUNCTION;  SCINTIGRAPHIC ABNORMALITIES MAY BE ARTIFACTUAL DUE TO DIAPHRAGMATIC ATTENUATION. NO  MYOCARDIAL ISCHEMIA IDENTIFIED. OTHER FINDINGS AS NOTED.   11/2009 Echo LVEF 60-65%, cannot evaluate diastolic function.      Assessment and Plan  1. CAD  - presumed CAD based on evidence of infarct by imaging, no prior clinical event  - denies any symptoms, continue current meds   2. Afib  CHADS2Vasc score is 4,he will continue eliquis -doing well without symptoms, continue current meds   3. HTN  - reasonable control, continue current meds  4. Hyperlipidemia  - Has been on simva 20 for many years and tolerated, we have continued. - continue current meds, f/u pcp labs  5. CKD III - limit nephrotoxic drugs, follow up upcoming  labs with pcp  F/u 6 months      Antoine Poche, M.D.

## 2017-12-10 NOTE — Patient Instructions (Signed)

## 2017-12-25 DIAGNOSIS — E118 Type 2 diabetes mellitus with unspecified complications: Secondary | ICD-10-CM | POA: Diagnosis not present

## 2017-12-25 DIAGNOSIS — N183 Chronic kidney disease, stage 3 (moderate): Secondary | ICD-10-CM | POA: Diagnosis not present

## 2017-12-25 DIAGNOSIS — Z79899 Other long term (current) drug therapy: Secondary | ICD-10-CM | POA: Diagnosis not present

## 2017-12-25 DIAGNOSIS — E785 Hyperlipidemia, unspecified: Secondary | ICD-10-CM | POA: Diagnosis not present

## 2017-12-27 DIAGNOSIS — I10 Essential (primary) hypertension: Secondary | ICD-10-CM | POA: Diagnosis not present

## 2017-12-27 DIAGNOSIS — E118 Type 2 diabetes mellitus with unspecified complications: Secondary | ICD-10-CM | POA: Diagnosis not present

## 2017-12-27 DIAGNOSIS — Z79899 Other long term (current) drug therapy: Secondary | ICD-10-CM | POA: Diagnosis not present

## 2017-12-27 DIAGNOSIS — E785 Hyperlipidemia, unspecified: Secondary | ICD-10-CM | POA: Diagnosis not present

## 2017-12-27 DIAGNOSIS — Z794 Long term (current) use of insulin: Secondary | ICD-10-CM | POA: Diagnosis not present

## 2018-03-21 DIAGNOSIS — N183 Chronic kidney disease, stage 3 (moderate): Secondary | ICD-10-CM | POA: Diagnosis not present

## 2018-03-21 DIAGNOSIS — E118 Type 2 diabetes mellitus with unspecified complications: Secondary | ICD-10-CM | POA: Diagnosis not present

## 2018-03-21 DIAGNOSIS — E785 Hyperlipidemia, unspecified: Secondary | ICD-10-CM | POA: Diagnosis not present

## 2018-03-21 DIAGNOSIS — Z79899 Other long term (current) drug therapy: Secondary | ICD-10-CM | POA: Diagnosis not present

## 2018-03-21 DIAGNOSIS — I10 Essential (primary) hypertension: Secondary | ICD-10-CM | POA: Diagnosis not present

## 2018-03-27 DIAGNOSIS — E118 Type 2 diabetes mellitus with unspecified complications: Secondary | ICD-10-CM | POA: Diagnosis not present

## 2018-03-27 DIAGNOSIS — G301 Alzheimer's disease with late onset: Secondary | ICD-10-CM | POA: Diagnosis not present

## 2018-03-27 DIAGNOSIS — E7800 Pure hypercholesterolemia, unspecified: Secondary | ICD-10-CM | POA: Diagnosis not present

## 2018-03-27 DIAGNOSIS — Z0001 Encounter for general adult medical examination with abnormal findings: Secondary | ICD-10-CM | POA: Diagnosis not present

## 2018-04-30 DIAGNOSIS — E785 Hyperlipidemia, unspecified: Secondary | ICD-10-CM | POA: Diagnosis not present

## 2018-04-30 DIAGNOSIS — I480 Paroxysmal atrial fibrillation: Secondary | ICD-10-CM | POA: Diagnosis not present

## 2018-04-30 DIAGNOSIS — I10 Essential (primary) hypertension: Secondary | ICD-10-CM | POA: Diagnosis not present

## 2018-04-30 DIAGNOSIS — E118 Type 2 diabetes mellitus with unspecified complications: Secondary | ICD-10-CM | POA: Diagnosis not present

## 2018-06-12 ENCOUNTER — Ambulatory Visit: Payer: Medicare Other | Admitting: Cardiology

## 2018-07-25 DIAGNOSIS — I10 Essential (primary) hypertension: Secondary | ICD-10-CM | POA: Diagnosis not present

## 2018-07-25 DIAGNOSIS — E785 Hyperlipidemia, unspecified: Secondary | ICD-10-CM | POA: Diagnosis not present

## 2018-07-25 DIAGNOSIS — E118 Type 2 diabetes mellitus with unspecified complications: Secondary | ICD-10-CM | POA: Diagnosis not present

## 2018-07-30 DIAGNOSIS — E118 Type 2 diabetes mellitus with unspecified complications: Secondary | ICD-10-CM | POA: Diagnosis not present

## 2018-07-30 DIAGNOSIS — G301 Alzheimer's disease with late onset: Secondary | ICD-10-CM | POA: Diagnosis not present

## 2018-07-30 DIAGNOSIS — E785 Hyperlipidemia, unspecified: Secondary | ICD-10-CM | POA: Diagnosis not present

## 2018-07-30 DIAGNOSIS — I10 Essential (primary) hypertension: Secondary | ICD-10-CM | POA: Diagnosis not present

## 2018-07-30 DIAGNOSIS — I480 Paroxysmal atrial fibrillation: Secondary | ICD-10-CM | POA: Diagnosis not present

## 2018-09-08 ENCOUNTER — Encounter: Payer: Self-pay | Admitting: Cardiology

## 2018-09-08 ENCOUNTER — Telehealth (INDEPENDENT_AMBULATORY_CARE_PROVIDER_SITE_OTHER): Payer: Medicare Other | Admitting: Cardiology

## 2018-09-08 DIAGNOSIS — I4891 Unspecified atrial fibrillation: Secondary | ICD-10-CM

## 2018-09-08 DIAGNOSIS — I10 Essential (primary) hypertension: Secondary | ICD-10-CM

## 2018-09-08 DIAGNOSIS — I2510 Atherosclerotic heart disease of native coronary artery without angina pectoris: Secondary | ICD-10-CM

## 2018-09-08 DIAGNOSIS — E782 Mixed hyperlipidemia: Secondary | ICD-10-CM

## 2018-09-08 NOTE — Progress Notes (Signed)
Virtual Visit via Telephone Note   This visit type was conducted due to national recommendations for restrictions regarding the COVID-19 Pandemic (e.g. social distancing) in an effort to limit this patient's exposure and mitigate transmission in our community.  Due to his co-morbid illnesses, this patient is at least at moderate risk for complications without adequate follow up.  This format is felt to be most appropriate for this patient at this time.  The patient did not have access to video technology/had technical difficulties with video requiring transitioning to audio format only (telephone).  All issues noted in this document were discussed and addressed.  No physical exam could be performed with this format.  Please refer to the patient's chart for his  consent to telehealth for [facility].   Date:  09/08/2018   ID:  Matthew Boyd, DOB 1933/12/16, MRN [id number]  Patient Location: Home Provider Location: Office  PCP:  Pearson GrippeKim, James, MD  Cardiologist:  Dina RichBranch, Lonie Rummell, MD  Electrophysiologist:  None   Evaluation Performed:  Follow-Up Visit  Chief Complaint:  Follow up  History of Present Illness:    Matthew Boyd is a 85 y.o. male seen today for follow up of the following medical problems. History of via his wife due to patient's significant dementia.   1. CAD  - evidence of prior infarct from imaging (echo and MPI), never had an acute clinical event or cath.  - LVEF 60-65% by echo 11/2009     - no recent chest pain, no SOB or DOE - compliant with meds   2. Paroxysmal afib  - changed to eliquis after recurrentadmissionswith supratherapeutic INR and bleeding.  - denies any palpitations. No bleeding issues on eliquis  3. HTN  - compliant with meds, does not have home bp cuff  4. Hyperlipidemia   - recent labs with pcp 07/2018  - compliant with statin    5. CKD III - labs followed by pcp  The patient does not have symptoms concerning for  COVID-19 infection (fever, chills, cough, or new shortness of breath).    Past Medical History:  Diagnosis Date  . Arteriosclerotic cardiovascular disease (ASCVD)    prior MI by echocardiography; stress nuclear -inferior scar; no prior cath  . Benign prostatic hypertrophy    s/p transurethral resection of the prostate  . Chronic kidney disease    Creatinine of 1.5 in 11/03 and 1.8 in 10/08; 1.75 in 09/2009  . Diabetes mellitus type 2, insulin dependent (HCC)   . Hyperlipidemia   . Hypertension   . Nephrolithiasis    surgical stone extractionx2  . Paroxysmal atrial fibrillation (HCC)  10/2009   onset in 10/2009; anticoagulation  . Tobacco abuse, in remission    25 pack years, discontinued in 1990   Past Surgical History:  Procedure Laterality Date  . Surgical removal of renal calculi     3  . TRANSURETHRAL RESECTION OF PROSTATE       Current Meds  Medication Sig  . acetaminophen (TYLENOL) 325 MG tablet Take 650 mg by mouth every 6 (six) hours as needed for mild pain or moderate pain.  Marland Kitchen. amLODipine (NORVASC) 2.5 MG tablet Take 2.5 mg by mouth daily.  Marland Kitchen. apixaban (ELIQUIS) 2.5 MG TABS tablet Take 1 tablet (2.5 mg total) by mouth 2 (two) times daily.  . chlorthalidone (HYGROTON) 25 MG tablet Take 25 mg by mouth daily.  Marland Kitchen. donepezil (ARICEPT) 10 MG tablet Take 10 mg by mouth at bedtime.   . metoprolol (  LOPRESSOR) 50 MG tablet Take 50 mg by mouth 2 (two) times daily.    . simvastatin (ZOCOR) 20 MG tablet Take 20 mg by mouth daily.  . TRESIBA FLEXTOUCH 100 UNIT/ML SOPN FlexTouch Pen      Allergies:   Patient has no known allergies.   Social History   Tobacco Use  . Smoking status: Former Smoker    Packs/day: 1.00    Years: 20.00    Pack years: 20.00    Types: Cigarettes    Start date: 08/29/1955    Quit date: 03/05/1988    Years since quitting: 30.5  . Smokeless tobacco: Never Used  . Tobacco comment: Smoked 25 years  Substance Use Topics  . Alcohol use: Yes     Alcohol/week: 0.0 standard drinks    Comment: Excessive use in the 1990s 04/08/14- no longer drinks   . Drug use: No     Family Hx: The patient's family history includes Diabetes in his mother and sister; Heart attack in his father; Heart disease in his sister.  ROS:   Please see the history of present illness.     All other systems reviewed and are negative.   Prior CV studies:   The following studies were reviewed today:  05/2001 MPI THE LEFT VENTRICLE WAS MILDLY DILATED. ON TOMOGRAPHIC IMAGES RECONSTRUCTED IN  STANDARD PLANES, THERE WAS A MILD DEFECT INVOLVING THE INFERIOR AND INFEROLATERAL MYOCARDIUM THAT  DEMONSTRATED NO REVERSIBILITY WHEN COMPARED TO REST IMAGES. THE GATED 3-D RECONSTRUCTION REVEALED  GLOBAL HYPOKINESIS WITH AN ESTIMATED EJECTION FRACTION OF .38. ON THE GATED TOMOGRAMS, THERE WAS  DECREASED SYSTOLIC ACCENTUATION OF ACTIVITY IN THE INFERIOR WALL. IMPRESSION ABNORMAL STRESS CARDIOLITE STUDY WITH IMPAIRED EXERCISE CAPACITY, A  NEGATIVE STRESS EKG, MILD LEFT VENTRICULAR DILATATION WITH MILD TO MODERATE GLOBAL SYSTOLIC DYSFUNCTION;  SCINTIGRAPHIC ABNORMALITIES MAY BE ARTIFACTUAL DUE TO DIAPHRAGMATIC ATTENUATION. NO  MYOCARDIAL ISCHEMIA IDENTIFIED. OTHER FINDINGS AS NOTED.   11/2009 Echo LVEF 60-65%, cannot evaluate diastolic function.    Labs/Other Tests and Data Reviewed:    EKG:  No ECG reviewed.  Recent Labs: No results found for requested labs within last 8760 hours.   Recent Lipid Panel Lab Results  Component Value Date/Time   CHOL 145 02/13/2011 10:17 AM   TRIG 121 02/13/2011 10:17 AM   HDL 60 02/13/2011 10:17 AM   CHOLHDL 2.4 02/13/2011 10:17 AM   LDLCALC 61 02/13/2011 10:17 AM    Wt Readings from Last 3 Encounters:  12/10/17 184 lb (83.5 kg)  06/03/17 177 lb (80.3 kg)  11/23/16 165 lb (74.8 kg)     Objective:    Vital Signs:   Today's Vitals   There is no height or weight on file to calculate BMI.  History is  provided by wife due to patient's dementia, unable to assess over phone any exam.   ASSESSMENT & PLAN:    1. CAD  - presumed CAD based on evidence of infarct by imaging, no prior clinical event  - no symptoms, continue current meds  2. Afib  - no symptoms, continue current meds including anticoag   3. HTN  - continue current meds  4. Hyperlipidemia  - request pcp labs, continue statin  5.Dementia - started on aricept by pcp, acording to wife significant progression of his dementia recently  COVID-19 Education: The signs and symptoms of COVID-19 were discussed with the patient and how to seek care for testing (follow up with PCP or arrange E-visit).  The importance of social distancing was  discussed today.  Time:   Today, I have spent 14 minutes with the patient with telehealth technology discussing the above problems.     Medication Adjustments/Labs and Tests Ordered: Current medicines are reviewed at length with the patient today.  Concerns regarding medicines are outlined above.   Tests Ordered: No orders of the defined types were placed in this encounter.   Medication Changes: No orders of the defined types were placed in this encounter.   Follow Up:  In Person in 6 month(s)  Signed, Dina RichBranch, Cloris Flippo, MD  09/08/2018 12:34 PM    [facility]

## 2018-09-08 NOTE — Patient Instructions (Signed)
Medication Instructions:  Your physician recommends that you continue on your current medications as directed. Please refer to the Current Medication list given to you today.   Labwork: I WILL REQUEST LABS & OFFICE VISIT FROM PCP   Testing/Procedures: NONE  Follow-Up: Your physician wants you to follow-up in: 6 MONTHS.  You will receive a reminder letter in the mail two months in advance. If you don't receive a letter, please call our office to schedule the follow-up appointment.   Any Other Special Instructions Will Be Listed Below (If Applicable).     If you need a refill on your cardiac medications before your next appointment, please call your pharmacy.

## 2018-09-23 ENCOUNTER — Telehealth: Payer: Self-pay | Admitting: *Deleted

## 2018-09-23 NOTE — Telephone Encounter (Signed)
Pt wife in office requesting samples of Eliquis 2.5 mg. Given 2 sample boxes.

## 2018-10-30 DIAGNOSIS — E118 Type 2 diabetes mellitus with unspecified complications: Secondary | ICD-10-CM | POA: Diagnosis not present

## 2018-10-30 DIAGNOSIS — E785 Hyperlipidemia, unspecified: Secondary | ICD-10-CM | POA: Diagnosis not present

## 2018-10-30 DIAGNOSIS — I10 Essential (primary) hypertension: Secondary | ICD-10-CM | POA: Diagnosis not present

## 2018-11-03 DIAGNOSIS — E119 Type 2 diabetes mellitus without complications: Secondary | ICD-10-CM | POA: Diagnosis not present

## 2018-11-03 DIAGNOSIS — B379 Candidiasis, unspecified: Secondary | ICD-10-CM | POA: Diagnosis not present

## 2018-11-03 DIAGNOSIS — I129 Hypertensive chronic kidney disease with stage 1 through stage 4 chronic kidney disease, or unspecified chronic kidney disease: Secondary | ICD-10-CM | POA: Diagnosis not present

## 2018-11-03 DIAGNOSIS — Z794 Long term (current) use of insulin: Secondary | ICD-10-CM | POA: Diagnosis not present

## 2018-11-03 DIAGNOSIS — N183 Chronic kidney disease, stage 3 (moderate): Secondary | ICD-10-CM | POA: Diagnosis not present

## 2018-11-03 DIAGNOSIS — E785 Hyperlipidemia, unspecified: Secondary | ICD-10-CM | POA: Diagnosis not present

## 2019-01-27 DIAGNOSIS — E118 Type 2 diabetes mellitus with unspecified complications: Secondary | ICD-10-CM | POA: Diagnosis not present

## 2019-01-27 DIAGNOSIS — N183 Chronic kidney disease, stage 3 unspecified: Secondary | ICD-10-CM | POA: Diagnosis not present

## 2019-01-27 DIAGNOSIS — I10 Essential (primary) hypertension: Secondary | ICD-10-CM | POA: Diagnosis not present

## 2019-01-27 DIAGNOSIS — E785 Hyperlipidemia, unspecified: Secondary | ICD-10-CM | POA: Diagnosis not present

## 2019-02-03 DIAGNOSIS — Z794 Long term (current) use of insulin: Secondary | ICD-10-CM | POA: Diagnosis not present

## 2019-02-03 DIAGNOSIS — E7800 Pure hypercholesterolemia, unspecified: Secondary | ICD-10-CM | POA: Diagnosis not present

## 2019-02-03 DIAGNOSIS — I10 Essential (primary) hypertension: Secondary | ICD-10-CM | POA: Diagnosis not present

## 2019-02-03 DIAGNOSIS — E119 Type 2 diabetes mellitus without complications: Secondary | ICD-10-CM | POA: Diagnosis not present

## 2019-03-17 ENCOUNTER — Ambulatory Visit: Payer: Medicare Other | Admitting: Cardiology

## 2019-03-23 ENCOUNTER — Other Ambulatory Visit: Payer: Self-pay

## 2019-03-23 ENCOUNTER — Ambulatory Visit: Payer: Medicare Other | Admitting: Cardiology

## 2019-03-23 ENCOUNTER — Encounter: Payer: Self-pay | Admitting: Cardiology

## 2019-03-23 VITALS — BP 146/77 | HR 58 | Temp 97.3°F | Ht 70.0 in | Wt 184.0 lb

## 2019-03-23 DIAGNOSIS — I4891 Unspecified atrial fibrillation: Secondary | ICD-10-CM

## 2019-03-23 DIAGNOSIS — E782 Mixed hyperlipidemia: Secondary | ICD-10-CM

## 2019-03-23 DIAGNOSIS — I2510 Atherosclerotic heart disease of native coronary artery without angina pectoris: Secondary | ICD-10-CM | POA: Diagnosis not present

## 2019-03-23 DIAGNOSIS — I10 Essential (primary) hypertension: Secondary | ICD-10-CM

## 2019-03-23 NOTE — Patient Instructions (Signed)

## 2019-03-23 NOTE — Progress Notes (Signed)
Clinical Summary Mr. Hornbaker is a 86 y.o.male seen today for follow up of the following medical problems.   1. CAD  - evidence of prior infarct from imaging (echo and MPI), never had an acute clinical event or cath.  - LVEF 60-65% by echo 11/2009     - no recent chest pain - compliant with meds.   2. Paroxysmal afib  - changed to eliquis after recurrentadmissionswith supratherapeutic INR and bleeding.  - no recent palpitations - no bleeding . No bleeding on eliquis.   3. HTN  - compliant with meds  4. Hyperlipidemia  - labs followed by pcp, on statin    5. CKD III - labs followed by pcp  6. Dementia - followed by pcp Past Medical History:  Diagnosis Date  . Arteriosclerotic cardiovascular disease (ASCVD)    prior MI by echocardiography; stress nuclear -inferior scar; no prior cath  . Benign prostatic hypertrophy    s/p transurethral resection of the prostate  . Chronic kidney disease    Creatinine of 1.5 in 11/03 and 1.8 in 10/08; 1.75 in 09/2009  . Diabetes mellitus type 2, insulin dependent ([place])   . Hyperlipidemia   . Hypertension   . Nephrolithiasis    surgical stone extractionx2  . Paroxysmal atrial fibrillation ([place])  10/2009   onset in 10/2009; anticoagulation  . Tobacco abuse, in remission    25 pack years, discontinued in 1990     No Known Allergies   Current Outpatient Medications  Medication Sig Dispense Refill  . acetaminophen (TYLENOL) 325 MG tablet Take 650 mg by mouth every 6 (six) hours as needed for mild pain or moderate pain.    Marland Kitchen amLODipine (NORVASC) 2.5 MG tablet Take 2.5 mg by mouth daily.    Marland Kitchen apixaban (ELIQUIS) 2.5 MG TABS tablet Take 1 tablet (2.5 mg total) by mouth 2 (two) times daily. 60 tablet   . chlorthalidone (HYGROTON) 25 MG tablet Take 25 mg by mouth daily.    Marland Kitchen donepezil (ARICEPT) 10 MG tablet Take 10 mg by mouth at bedtime.     . metoprolol (LOPRESSOR) 50 MG tablet Take 50 mg by mouth 2 (two) times  daily.      . simvastatin (ZOCOR) 20 MG tablet Take 20 mg by mouth daily.  3  . TRESIBA FLEXTOUCH 100 UNIT/ML SOPN FlexTouch Pen      No current facility-administered medications for this visit.     Past Surgical History:  Procedure Laterality Date  . Surgical removal of renal calculi     3  . TRANSURETHRAL RESECTION OF PROSTATE       No Known Allergies    Family History  Problem Relation Age of Onset  . Diabetes Mother   . Heart attack Father   . Heart disease Sister   . Diabetes Sister      Social History Mr. Joslyn reports that he quit smoking about 31 years ago. His smoking use included cigarettes. He started smoking about 63 years ago. He has a 20.00 pack-year smoking history. He has never used smokeless tobacco. Mr. Rostro reports current alcohol use.   Review of Systems CONSTITUTIONAL: No weight loss, fever, chills, weakness or fatigue.  HEENT: Eyes: No visual loss, blurred vision, double vision or yellow sclerae.No hearing loss, sneezing, congestion, runny nose or sore throat.  SKIN: No rash or itching.  CARDIOVASCULAR: per hpi RESPIRATORY: No shortness of breath, cough or sputum.  GASTROINTESTINAL: No anorexia, nausea, vomiting or diarrhea. No abdominal  pain or blood.  GENITOURINARY: No burning on urination, no polyuria NEUROLOGICAL: No headache, dizziness, syncope, paralysis, ataxia, numbness or tingling in the extremities. No change in bowel or bladder control.  MUSCULOSKELETAL: No muscle, back pain, joint pain or stiffness.  LYMPHATICS: No enlarged nodes. No history of splenectomy.  PSYCHIATRIC: No history of depression or anxiety.  ENDOCRINOLOGIC: No reports of sweating, cold or heat intolerance. No polyuria or polydipsia.  Marland Kitchen   Physical Examination Today's Vitals   03/23/19 0905  BP: (!) 146/77  Pulse: (!) 58  Temp: (!) 97.3 F (36.3 C)  TempSrc: Temporal  SpO2: 98%  Weight: 184 lb (83.5 kg)  Height: 5\' 10"  (1.778 m)   Body mass index is  26.4 kg/m.  Gen: resting comfortably, no acute distress HEENT: no scleral icterus, pupils equal round and reactive, no palptable cervical adenopathy,  CV: irreg, no mr/g, no jvd Resp: Clear to auscultation bilaterally GI: abdomen is soft, non-tender, non-distended, normal bowel sounds, no hepatosplenomegaly MSK: extremities are warm, no edema.  Skin: warm, no rash Neuro:  no focal deficits Psych: appropriate affect   Diagnostic Studies     Assessment and Plan   1. CAD  - presumed CAD based on evidence of infarct by imaging, no prior clinical event  - denies any symptoms, continue current meds  2. Afib  - no symptoms - continue current meds including eliquis, renally dosed based on Cr and age   75. HTN  - manual recheck 134/70, essentially at goal. Continue current meds  4. Hyperlipidemia  - continue statin, request pcp labs   F/u 6 months   2, M.D.

## 2019-05-25 DIAGNOSIS — N183 Chronic kidney disease, stage 3 unspecified: Secondary | ICD-10-CM | POA: Diagnosis not present

## 2019-05-25 DIAGNOSIS — Z7901 Long term (current) use of anticoagulants: Secondary | ICD-10-CM | POA: Diagnosis not present

## 2019-05-25 DIAGNOSIS — I10 Essential (primary) hypertension: Secondary | ICD-10-CM | POA: Diagnosis not present

## 2019-05-25 DIAGNOSIS — Z79899 Other long term (current) drug therapy: Secondary | ICD-10-CM | POA: Diagnosis not present

## 2019-05-25 DIAGNOSIS — E785 Hyperlipidemia, unspecified: Secondary | ICD-10-CM | POA: Diagnosis not present

## 2019-05-25 DIAGNOSIS — E118 Type 2 diabetes mellitus with unspecified complications: Secondary | ICD-10-CM | POA: Diagnosis not present

## 2019-05-27 DIAGNOSIS — Z0001 Encounter for general adult medical examination with abnormal findings: Secondary | ICD-10-CM | POA: Diagnosis not present

## 2019-05-27 DIAGNOSIS — I129 Hypertensive chronic kidney disease with stage 1 through stage 4 chronic kidney disease, or unspecified chronic kidney disease: Secondary | ICD-10-CM | POA: Diagnosis not present

## 2019-05-27 DIAGNOSIS — Z7901 Long term (current) use of anticoagulants: Secondary | ICD-10-CM | POA: Diagnosis not present

## 2019-05-27 DIAGNOSIS — N1832 Chronic kidney disease, stage 3b: Secondary | ICD-10-CM | POA: Diagnosis not present

## 2019-05-27 DIAGNOSIS — I480 Paroxysmal atrial fibrillation: Secondary | ICD-10-CM | POA: Diagnosis not present

## 2019-12-15 ENCOUNTER — Inpatient Hospital Stay (HOSPITAL_COMMUNITY)
Admission: EM | Admit: 2019-12-15 | Discharge: 2019-12-17 | DRG: 394 | Disposition: A | Discharge status: Home Health | Payer: Medicare Other | Attending: Internal Medicine | Admitting: Internal Medicine

## 2019-12-15 ENCOUNTER — Other Ambulatory Visit: Payer: Self-pay

## 2019-12-15 ENCOUNTER — Encounter (HOSPITAL_COMMUNITY): Payer: Self-pay

## 2019-12-15 DIAGNOSIS — K626 Ulcer of anus and rectum: Secondary | ICD-10-CM | POA: Diagnosis not present

## 2019-12-15 DIAGNOSIS — K5900 Constipation, unspecified: Secondary | ICD-10-CM | POA: Insufficient documentation

## 2019-12-15 DIAGNOSIS — I4891 Unspecified atrial fibrillation: Secondary | ICD-10-CM | POA: Diagnosis present

## 2019-12-15 DIAGNOSIS — R451 Restlessness and agitation: Secondary | ICD-10-CM | POA: Diagnosis present

## 2019-12-15 DIAGNOSIS — Z20822 Contact with and (suspected) exposure to covid-19: Secondary | ICD-10-CM | POA: Diagnosis present

## 2019-12-15 DIAGNOSIS — I252 Old myocardial infarction: Secondary | ICD-10-CM

## 2019-12-15 DIAGNOSIS — K922 Gastrointestinal hemorrhage, unspecified: Secondary | ICD-10-CM

## 2019-12-15 DIAGNOSIS — E860 Dehydration: Secondary | ICD-10-CM | POA: Diagnosis present

## 2019-12-15 DIAGNOSIS — E785 Hyperlipidemia, unspecified: Secondary | ICD-10-CM | POA: Diagnosis present

## 2019-12-15 DIAGNOSIS — Z794 Long term (current) use of insulin: Secondary | ICD-10-CM

## 2019-12-15 DIAGNOSIS — I480 Paroxysmal atrial fibrillation: Secondary | ICD-10-CM | POA: Diagnosis present

## 2019-12-15 DIAGNOSIS — N179 Acute kidney failure, unspecified: Secondary | ICD-10-CM | POA: Diagnosis present

## 2019-12-15 DIAGNOSIS — N189 Chronic kidney disease, unspecified: Secondary | ICD-10-CM | POA: Diagnosis present

## 2019-12-15 DIAGNOSIS — K625 Hemorrhage of anus and rectum: Secondary | ICD-10-CM | POA: Diagnosis not present

## 2019-12-15 DIAGNOSIS — Z91138 Patient's unintentional underdosing of medication regimen for other reason: Secondary | ICD-10-CM

## 2019-12-15 DIAGNOSIS — F0391 Unspecified dementia with behavioral disturbance: Secondary | ICD-10-CM | POA: Diagnosis present

## 2019-12-15 DIAGNOSIS — Z87442 Personal history of urinary calculi: Secondary | ICD-10-CM

## 2019-12-15 DIAGNOSIS — E11649 Type 2 diabetes mellitus with hypoglycemia without coma: Secondary | ICD-10-CM | POA: Diagnosis not present

## 2019-12-15 DIAGNOSIS — Z8249 Family history of ischemic heart disease and other diseases of the circulatory system: Secondary | ICD-10-CM

## 2019-12-15 DIAGNOSIS — T474X6A Underdosing of other laxatives, initial encounter: Secondary | ICD-10-CM | POA: Diagnosis present

## 2019-12-15 DIAGNOSIS — Z79899 Other long term (current) drug therapy: Secondary | ICD-10-CM

## 2019-12-15 DIAGNOSIS — E1122 Type 2 diabetes mellitus with diabetic chronic kidney disease: Secondary | ICD-10-CM | POA: Diagnosis present

## 2019-12-15 DIAGNOSIS — Z7901 Long term (current) use of anticoagulants: Secondary | ICD-10-CM

## 2019-12-15 DIAGNOSIS — Z66 Do not resuscitate: Secondary | ICD-10-CM | POA: Diagnosis present

## 2019-12-15 DIAGNOSIS — I2510 Atherosclerotic heart disease of native coronary artery without angina pectoris: Secondary | ICD-10-CM | POA: Diagnosis present

## 2019-12-15 DIAGNOSIS — I129 Hypertensive chronic kidney disease with stage 1 through stage 4 chronic kidney disease, or unspecified chronic kidney disease: Secondary | ICD-10-CM | POA: Diagnosis present

## 2019-12-15 DIAGNOSIS — N1832 Chronic kidney disease, stage 3b: Secondary | ICD-10-CM | POA: Diagnosis present

## 2019-12-15 DIAGNOSIS — E119 Type 2 diabetes mellitus without complications: Secondary | ICD-10-CM

## 2019-12-15 DIAGNOSIS — Z833 Family history of diabetes mellitus: Secondary | ICD-10-CM

## 2019-12-15 DIAGNOSIS — Z87891 Personal history of nicotine dependence: Secondary | ICD-10-CM

## 2019-12-15 DIAGNOSIS — R531 Weakness: Secondary | ICD-10-CM | POA: Diagnosis present

## 2019-12-15 DIAGNOSIS — I10 Essential (primary) hypertension: Secondary | ICD-10-CM | POA: Diagnosis present

## 2019-12-15 LAB — CBC WITH DIFFERENTIAL/PLATELET
Abs Immature Granulocytes: 0.05 10*3/uL (ref 0.00–0.07)
Basophils Absolute: 0 10*3/uL (ref 0.0–0.1)
Basophils Relative: 0 %
Eosinophils Absolute: 0.1 10*3/uL (ref 0.0–0.5)
Eosinophils Relative: 1 %
HCT: 50.3 % (ref 39.0–52.0)
Hemoglobin: 16.8 g/dL (ref 13.0–17.0)
Immature Granulocytes: 1 %
Lymphocytes Relative: 12 %
Lymphs Abs: 1.2 10*3/uL (ref 0.7–4.0)
MCH: 33 pg (ref 26.0–34.0)
MCHC: 33.4 g/dL (ref 30.0–36.0)
MCV: 98.8 fL (ref 80.0–100.0)
Monocytes Absolute: 0.8 10*3/uL (ref 0.1–1.0)
Monocytes Relative: 7 %
Neutro Abs: 8.5 10*3/uL — ABNORMAL HIGH (ref 1.7–7.7)
Neutrophils Relative %: 79 %
Platelets: 226 10*3/uL (ref 150–400)
RBC: 5.09 MIL/uL (ref 4.22–5.81)
RDW: 13.2 % (ref 11.5–15.5)
WBC: 10.7 10*3/uL — ABNORMAL HIGH (ref 4.0–10.5)
nRBC: 0 % (ref 0.0–0.2)

## 2019-12-15 LAB — COMPREHENSIVE METABOLIC PANEL
ALT: 17 U/L (ref 0–44)
AST: 25 U/L (ref 15–41)
Albumin: 4 g/dL (ref 3.5–5.0)
Alkaline Phosphatase: 58 U/L (ref 38–126)
Anion gap: 14 (ref 5–15)
BUN: 28 mg/dL — ABNORMAL HIGH (ref 8–23)
CO2: 26 mmol/L (ref 22–32)
Calcium: 9.2 mg/dL (ref 8.9–10.3)
Chloride: 99 mmol/L (ref 98–111)
Creatinine, Ser: 1.94 mg/dL — ABNORMAL HIGH (ref 0.61–1.24)
GFR, Estimated: 31 mL/min — ABNORMAL LOW (ref >60)
Glucose, Bld: 177 mg/dL — ABNORMAL HIGH (ref 70–99)
Potassium: 4.2 mmol/L (ref 3.5–5.1)
Sodium: 139 mmol/L (ref 135–145)
Total Bilirubin: 1 mg/dL (ref 0.3–1.2)
Total Protein: 7.5 g/dL (ref 6.5–8.1)

## 2019-12-15 LAB — HEMOGLOBIN A1C
Hgb A1c MFr Bld: 6.6 % — ABNORMAL HIGH (ref 4.8–5.6)
Mean Plasma Glucose: 142.72 mg/dL

## 2019-12-15 LAB — HEMOGLOBIN AND HEMATOCRIT, BLOOD
HCT: 47.5 % (ref 39.0–52.0)
HCT: 47.4 % (ref 39.0–52.0)
HCT: 46.4 % (ref 39.0–52.0)
Hemoglobin: 16.1 g/dL (ref 13.0–17.0)
Hemoglobin: 16.2 g/dL (ref 13.0–17.0)
Hemoglobin: 15.8 g/dL (ref 13.0–17.0)

## 2019-12-15 LAB — RESPIRATORY PANEL BY RT PCR (FLU A&B, COVID)
Influenza A by PCR: NEGATIVE
Influenza B by PCR: NEGATIVE
SARS Coronavirus 2 by RT PCR: NEGATIVE

## 2019-12-15 LAB — GLUCOSE, CAPILLARY: Glucose-Capillary: 159 mg/dL — ABNORMAL HIGH (ref 70–99)

## 2019-12-15 LAB — POC OCCULT BLOOD, ED: Fecal Occult Bld: POSITIVE — AB

## 2019-12-15 MED ORDER — SODIUM CHLORIDE 0.9% FLUSH
3.0000 mL | Freq: Two times a day (BID) | INTRAVENOUS | Status: DC
  Administered 2019-12-16: 3 mL via INTRAVENOUS

## 2019-12-15 MED ORDER — QUETIAPINE FUMARATE 25 MG PO TABS
12.5000 mg | ORAL_TABLET | Freq: Two times a day (BID) | ORAL | Status: DC
  Administered 2019-12-15: 12.5 mg via ORAL
  Filled 2019-12-15 (×2): qty 1

## 2019-12-15 MED ORDER — TRAZODONE HCL 50 MG PO TABS
50.0000 mg | ORAL_TABLET | Freq: Every day | ORAL | Status: DC
  Administered 2019-12-15 – 2019-12-16 (×2): 50 mg via ORAL
  Filled 2019-12-15 (×2): qty 1

## 2019-12-15 MED ORDER — INFLUENZA VAC A&B SA ADJ QUAD 0.5 ML IM PRSY: 0.5000 mL | PREFILLED_SYRINGE | INTRAMUSCULAR | Status: DC

## 2019-12-15 MED ORDER — ONDANSETRON HCL 4 MG/2ML IJ SOLN: 4.0000 mg | Freq: Four times a day (QID) | INTRAMUSCULAR | Status: DC | PRN

## 2019-12-15 MED ORDER — SODIUM CHLORIDE 0.9% FLUSH: 3.0000 mL | Freq: Two times a day (BID) | INTRAVENOUS | Status: DC

## 2019-12-15 MED ORDER — AMLODIPINE BESYLATE 5 MG PO TABS
2.5000 mg | ORAL_TABLET | Freq: Every day | ORAL | Status: DC
  Administered 2019-12-15 – 2019-12-17 (×2): 2.5 mg via ORAL
  Filled 2019-12-15 (×3): qty 1

## 2019-12-15 MED ORDER — ACETAMINOPHEN 650 MG RE SUPP: 650.0000 mg | Freq: Four times a day (QID) | RECTAL | Status: DC | PRN

## 2019-12-15 MED ORDER — POLYETHYLENE GLYCOL 3350 17 G PO PACK: 17.0000 g | PACK | Freq: Every day | ORAL | Status: DC | PRN

## 2019-12-15 MED ORDER — SODIUM CHLORIDE 0.9 % IV SOLN: INTRAVENOUS | Status: DC

## 2019-12-15 MED ORDER — SODIUM CHLORIDE 0.9% FLUSH: 3.0000 mL | INTRAVENOUS | Status: DC | PRN

## 2019-12-15 MED ORDER — ACETAMINOPHEN 325 MG PO TABS: 650.0000 mg | ORAL_TABLET | Freq: Four times a day (QID) | ORAL | Status: DC | PRN

## 2019-12-15 MED ORDER — ONDANSETRON HCL 4 MG PO TABS: 4.0000 mg | ORAL_TABLET | Freq: Four times a day (QID) | ORAL | Status: DC | PRN

## 2019-12-15 MED ORDER — HALOPERIDOL LACTATE 5 MG/ML IJ SOLN
5.0000 mg | Freq: Once | INTRAMUSCULAR | Status: AC
  Administered 2019-12-15: 5 mg via INTRAVENOUS
  Filled 2019-12-15: qty 1

## 2019-12-15 MED ORDER — SODIUM CHLORIDE 0.9% FLUSH
3.0000 mL | Freq: Two times a day (BID) | INTRAVENOUS | Status: DC
  Administered 2019-12-15 – 2019-12-17 (×3): 3 mL via INTRAVENOUS

## 2019-12-15 MED ORDER — DONEPEZIL HCL 5 MG PO TABS
10.0000 mg | ORAL_TABLET | Freq: Every day | ORAL | Status: DC
  Administered 2019-12-15 – 2019-12-16 (×2): 10 mg via ORAL
  Filled 2019-12-15 (×2): qty 2

## 2019-12-15 MED ORDER — SIMVASTATIN 20 MG PO TABS
20.0000 mg | ORAL_TABLET | Freq: Every day | ORAL | Status: DC
  Administered 2019-12-15 – 2019-12-17 (×2): 20 mg via ORAL
  Filled 2019-12-15 (×3): qty 1

## 2019-12-15 MED ORDER — BISACODYL 10 MG RE SUPP: 10.0000 mg | Freq: Every day | RECTAL | Status: DC | PRN

## 2019-12-15 MED ORDER — INSULIN ASPART 100 UNIT/ML ~~LOC~~ SOLN
0.0000 [IU] | Freq: Every day | SUBCUTANEOUS | Status: DC
  Administered 2019-12-16: 2 [IU] via SUBCUTANEOUS

## 2019-12-15 MED ORDER — HYDROCORTISONE ACETATE 25 MG RE SUPP: 25.0000 mg | Freq: Two times a day (BID) | RECTAL | Status: DC

## 2019-12-15 MED ORDER — METOPROLOL TARTRATE 50 MG PO TABS
50.0000 mg | ORAL_TABLET | Freq: Two times a day (BID) | ORAL | Status: DC
  Administered 2019-12-15 – 2019-12-17 (×3): 50 mg via ORAL
  Filled 2019-12-15 (×5): qty 1

## 2019-12-15 MED ORDER — INSULIN ASPART 100 UNIT/ML ~~LOC~~ SOLN
0.0000 [IU] | Freq: Three times a day (TID) | SUBCUTANEOUS | Status: DC
  Administered 2019-12-17: 2 [IU] via SUBCUTANEOUS

## 2019-12-15 MED ORDER — POLYETHYLENE GLYCOL 3350 17 G PO PACK
17.0000 g | PACK | Freq: Two times a day (BID) | ORAL | Status: DC
  Administered 2019-12-15 – 2019-12-17 (×4): 17 g via ORAL
  Filled 2019-12-15 (×5): qty 1

## 2019-12-15 MED ORDER — HYDROCORTISONE ACETATE 25 MG RE SUPP
25.0000 mg | Freq: Two times a day (BID) | RECTAL | Status: DC
  Administered 2019-12-15: 25 mg via RECTAL
  Filled 2019-12-15 (×4): qty 1

## 2019-12-15 MED ORDER — SODIUM CHLORIDE 0.9 % IV SOLN: 250.0000 mL | INTRAVENOUS | Status: DC | PRN

## 2019-12-15 MED ORDER — LACTULOSE 10 GM/15ML PO SOLN
60.0000 g | Freq: Once | ORAL | Status: AC
  Administered 2019-12-15: 60 g via ORAL
  Filled 2019-12-15: qty 90

## 2019-12-15 MED ORDER — PANTOPRAZOLE SODIUM 40 MG IV SOLR
40.0000 mg | Freq: Two times a day (BID) | INTRAVENOUS | Status: DC
  Administered 2019-12-15 – 2019-12-17 (×5): 40 mg via INTRAVENOUS
  Filled 2019-12-15 (×5): qty 40

## 2019-12-15 NOTE — Consult Note (Signed)
@LOGO @   Referring Provider: [facility]  Primary Care Physician:  , MD Primary Gastroenterologist:  Dr. [name]   Date of Admission: 12/15/19 Date of Consultation: 12/24/19  Reason for Consultation:  GI bleed on Eliquis  HPI:  Matthew Boyd is a 86 y.o. year old male with history of A. fib on Eliquis, prior MI with no prior cath, CKD, type 2 diabetes, HTN, HLD who presented to the emergency room 12/15/2019 due to rectal bleeding.  Hemoglobin within normal limits at 16.8, WBC mildly elevated at 10.7.  Fecal occult blood positive.  Creatinine 1.94, BUN 28.  Electrolytes within normal limits.  He has been started on IV Protonix twice daily.  Today:  History obtained from patients wife. Patient has dementia and is not able to provide any reliable history.   Rectal bleeding started yesterday after a hard BM. Has been straining for about 3 days. Blood and stool on toilet tissue when he wipes. Kept feeling he had to have a BM throughout the night. Maybe 6 small BMs over night with blood on toilet tissue with each BM. Had 3-4 small BMs this morning. Nocturnal BMs has been ongoing for the last 3 days. Used to take a stool softener daily but hasn't taken this in quite some time as patient seemed to be having good soft, formed BMs daily until recently.    No blood in toilet water. He had hematuria and "bleeding through his skin" requiring 4 units PRBCs several years ago. He was on coumadin at that time. Doesn't think he has ever had rectal bleeding in the past.   Last dose of Eliquis: Yesterday about 5:30pm.   Wife doesn't think he has had a colonoscopy.   No nausea or vomiting. Eating well. No weight loss. No GERD symptoms or dysphagia. No NSAIDs. No black stools.   Past Medical History:  Diagnosis Date  . Arteriosclerotic cardiovascular disease (ASCVD)    prior MI by echocardiography; stress nuclear -inferior scar; no prior cath  . Benign prostatic hypertrophy    s/p  transurethral resection of the prostate  . Chronic kidney disease    Creatinine of 1.5 in 11/03 and 1.8 in 10/08; 1.75 in 09/2009  . Diabetes mellitus type 2, insulin dependent (HCC)   . Hyperlipidemia   . Hypertension   . Nephrolithiasis    surgical stone extractionx2  . Paroxysmal atrial fibrillation (HCC)  10/2009   onset in 10/2009; anticoagulation  . Tobacco abuse, in remission    25 pack years, discontinued in 1990    Past Surgical History:  Procedure Laterality Date  . Surgical removal of renal calculi     3  . TRANSURETHRAL RESECTION OF PROSTATE      Prior to Admission medications   Medication Sig Start Date End Date Taking? Authorizing Provider  acetaminophen (TYLENOL) 325 MG tablet Take 325 mg by mouth in the morning and at bedtime.    Yes [provider]  amLODipine (NORVASC) 2.5 MG tablet Take 2.5 mg by mouth daily.   Yes [provider]  apixaban (ELIQUIS) 2.5 MG TABS tablet Take 1 tablet (2.5 mg total) by mouth 2 (two) times daily. 05/30/15  Yes 06/01/15, MD  donepezil (ARICEPT) 10 MG tablet Take 10 mg by mouth at bedtime.  08/12/18  Yes [provider]  MELATONIN PO Take 5-10 mg by mouth daily as needed (sleep).   Yes [provider]  metoprolol (LOPRESSOR) 50 MG tablet Take 50 mg by mouth 2 (two)  times daily.     Yes [provider]  QUEtiapine (SEROQUEL) 25 MG tablet Take 12.5 mg by mouth 2 (two) times daily. 02/04/19  Yes [provider]  simvastatin (ZOCOR) 20 MG tablet Take 20 mg by mouth daily. 01/25/15  Yes [provider]  TRESIBA FLEXTOUCH 100 UNIT/ML SOPN FlexTouch Pen Inject 30 Units into the skin daily.  11/19/17  Yes [provider]    Current Facility-Administered Medications  Medication Dose Route Frequency Provider Last Rate Last Admin  . pantoprazole (PROTONIX) injection 40 mg  40 mg Intravenous Q12H Shon HaleEmokpae, Courage, MD       Current Outpatient Medications  Medication Sig  Dispense Refill  . acetaminophen (TYLENOL) 325 MG tablet Take 325 mg by mouth in the morning and at bedtime.     Marland Kitchen. amLODipine (NORVASC) 2.5 MG tablet Take 2.5 mg by mouth daily.    Marland Kitchen. apixaban (ELIQUIS) 2.5 MG TABS tablet Take 1 tablet (2.5 mg total) by mouth 2 (two) times daily. 60 tablet   . donepezil (ARICEPT) 10 MG tablet Take 10 mg by mouth at bedtime.     Marland Kitchen. MELATONIN PO Take 5-10 mg by mouth daily as needed (sleep).    . metoprolol (LOPRESSOR) 50 MG tablet Take 50 mg by mouth 2 (two) times daily.      . QUEtiapine (SEROQUEL) 25 MG tablet Take 12.5 mg by mouth 2 (two) times daily.    . simvastatin (ZOCOR) 20 MG tablet Take 20 mg by mouth daily.  3  . TRESIBA FLEXTOUCH 100 UNIT/ML SOPN FlexTouch Pen Inject 30 Units into the skin daily.       Allergies as of 12/15/2019  . (No Known Allergies)    Family History  Problem Relation Age of Onset  . Diabetes Mother   . Heart attack Father   . Heart disease Sister   . Diabetes Sister     Social History   Socioeconomic History  . Marital status: Married    Spouse name: Not on file  . Number of children: Not on file  . Years of education: Not on file  . Highest education level: Not on file  Occupational History  . Occupation: Retired  Tobacco Use  . Smoking status: Former Smoker    Packs/day: 1.00    Years: 25.00    Pack years: 25.00    Types: Cigarettes    Start date: 08/29/1955    Quit date: 03/05/1988    Years since quitting: 31.8  . Smokeless tobacco: Never Used  . Tobacco comment: Smoked 25 years  Vaping Use  . Vaping Use: Never used  Substance and Sexual Activity  . Alcohol use: Not Currently    Alcohol/week: 0.0 standard drinks    Comment: Excessive use in the 1990s 04/08/14- no longer drinks   . Drug use: Not Currently  . Sexual activity: Not Currently  Other Topics Concern  . Not on file  Social History Narrative   Married with 2 adult children   Social Determinants of Health   Financial Resource Strain:   .  Difficulty of Paying Living Expenses: Not on file  Food Insecurity:   . Worried About Programme researcher, broadcasting/film/videounning Out of Food in the Last Year: Not on file  . Ran Out of Food in the Last Year: Not on file  Transportation Needs:   . Lack of Transportation (Medical): Not on file  . Lack of Transportation (Non-Medical): Not on file  Physical Activity:   . Days of Exercise per  Week: Not on file  . Minutes of Exercise per Session: Not on file  Stress:   . Feeling of Stress : Not on file  Social Connections:   . Frequency of Communication with Friends and Family: Not on file  . Frequency of Social Gatherings with Friends and Family: Not on file  . Attends Religious Services: Not on file  . Active Member of Clubs or Organizations: Not on file  . Attends Banker Meetings: Not on file  . Marital Status: Not on file  Intimate Partner Violence:   . Fear of Current or Ex-Partner: Not on file  . Emotionally Abused: Not on file  . Physically Abused: Not on file  . Sexually Abused: Not on file    Review of Systems: Information obtained and ROS is limited due to dementia. Gen: Denies fever, chills, cold or flulike symptoms. CV: Denies chest pain.  Resp: Denies shortness of breath.  GI: See HPI Derm: Denies rash Psych: Positive for dementia. Heme: See HPI  Physical Exam: Vital signs in last 24 hours: Temp:  [98.4 F (36.9 C)] 98.4 F (36.9 C) (10/12 0752) Pulse Rate:  [70-99] 70 (10/12 1030) Resp:  [14-22] 21 (10/12 1030) BP: (120-133)/(65-113) 131/65 (10/12 1030) SpO2:  [96 %-100 %] 100 % (10/12 1030) Weight:  [86.2 kg] 86.2 kg (10/12 0744)   General:   Alert,  Well-developed, well-nourished, in NAD.  Head:  Normocephalic and atraumatic. Eyes:  Sclera clear, no icterus. Conjunctiva pink. Ears:  Normal auditory acuity. Lungs: Clear throughout to auscultation.   No wheezes, crackles, or rhonchi. No acute distress. Heart:  Regular rate and rhythm; no murmurs, clicks, rubs,  or  gallops. Abdomen:  Soft and nondistended. Very mild TTP in the LLQ. No masses, hepatosplenomegaly or hernias noted. Normal bowel sounds, without guarding, and without rebound.   Rectal:  No bright red blood on exam.  Stool is brown.  No masses. No external hemorrhoids. No appreciable internal hemorrhoids. Formed stool in the rectal vault palpated with the tip of my index finger.  Msk:  Symmetrical without gross deformities.  Extremities:  Without edema. Neurologic: Oriented to self. Patient has Dementia.   Skin:  Intact without significant lesions or rashes. Cervical Nodes:  No significant cervical adenopathy. Psych:  Alert and cooperative. Normal mood and affect.  Intake/Output from previous day: No intake/output data recorded. Intake/Output this shift: No intake/output data recorded.  Lab Results: Recent Labs    12/15/19 0841  WBC 10.7*  HGB 16.8  HCT 50.3  PLT 226   BMET Recent Labs    12/15/19 0841  NA 139  K 4.2  CL 99  CO2 26  GLUCOSE 177*  BUN 28*  CREATININE 1.94*  CALCIUM 9.2   LFT Recent Labs    12/15/19 0841  PROT 7.5  ALBUMIN 4.0  AST 25  ALT 17  ALKPHOS 58  BILITOT 1.0   Impression: 86 y.o. year old male with history of dementia, A. fib on Eliquis, prior MI with no prior cath, CKD, type 2 diabetes, HTN, HLD who presented to the emergency room 12/15/2019 due to rectal bleeding.  Hemoglobin within normal limits at 16.8.  FOBT positive.  BUN elevated at 28 but creatinine is also elevated at 1.94.  Slight elevation of WBC at 10.7.  Per patient's wife, he began having toilet tissue hematochezia yesterday evening.  This is in the setting of new onset constipation for the last 3 days with straining.  She denies patient having any  abdominal pain, nausea, vomiting, or history of rectal bleeding.  Denies melena.  No unintentional weight loss.  No prior colonoscopy.  Last dose of Eliquis around 5:30 PM yesterday.  Abdominal exam with very mild tenderness palpation  in the LLQ.  Rectal exam with brown stool, no bright red blood, no masses or obvious hemorrhoids, stool in the rectal vault palpated by tip of my index finger.  Suspect benign anorectal bleeding in the setting of constipation.  Cannot rule out bleeding from colon polyps or malignancy.  Discussed with Dr. Levon Hedger.  No plans for colonoscopy currently as rectal bleeding is minimal and hemoglobin is stable.  We need to get his bowels moving better and will consider colonoscopy inpatient if he continues with rectal bleeding.  Plan: Hold Eliquis for now. Recommend at least observing patient overnight.   Monitor for ongoing overt GI bleeding. Monitor H&H. May have clear liquid diet. Tap water enema.  Start MiraLAX 17 g BID.  No colonoscopy inpatient unless decline in hemoglobin or ongoing rectal bleeding.    LOS: 0 days    12/15/2019, 10:59 AM   Ermalinda Memos, [facility]

## 2019-12-15 NOTE — ED Notes (Signed)
[facility]per(GI) in to see the Pt.

## 2019-12-15 NOTE — H&P (Addendum)
Patient Demographics:    Matthew Boyd, is a 86 y.o. male  MRN: [id number]   DOB - August 15, 1933  Admit Date - 12/15/2019  Outpatient Primary MD for the patient is [name], MD   Assessment & Plan:    Principal Problem:   Acute GI bleeding Active Problems:   Diabetes mellitus type 2, insulin dependent (HCC)   Chronic anticoagulation   Paroxysmal atrial fibrillation (HCC)   Hypertension   Chronic kidney disease   Atrial fibrillation (HCC)   Rectal bleeding  1)Rectal Bleeding--in the setting of constipation and Eliquis use-- ??  Anal fissure Vs Stercoral Ulcer/Rectal mucosal injury-no thrombosed hemorrhoids on exam -GI consult appreciated -laxatives, suppositories and enema as advised by GI service --Patient admits to noncompliance with stool softeners lately -Patient does not recall ever having a colonoscopy previously -Last dose of Eliquis was around 6 PM on 12/14/2019 -Serial H&H ordered   Hgb 16.8>> 16.1>>16.2 -Continue Protonix  2)DM2-no recent A1c available, hold [place] for now  Use Novolog/Humalog Sliding scale insulin with Accu-Cheks/Fingersticks as ordered   3)PAFib--- hold Eliquis in light of GI bleed, -Last dose of Eliquis was around 6 PM on 12/14/2019 -Continue metoprolol for rate control  4)Dementia--continue Aricept and Seroquel 12.5 mg twice daily, may use trazodone  for sleep  5)HTN--, continue amlodipine and metoprolol  6)CAD--- stable, chest pain-free, continue metoprolol and  Simvastatin  7)AKI---- due to dehydration , creatinine is up to 1.94  -No recent baseline creatinine available renally adjust medications, avoid nephrotoxic agents / dehydration  / hypotension    Disposition/Need for in-Hospital Stay- patient unable to be discharged at this time due to --- rectal bleeding  requiring further observation, AKI requiring gentle hydration*  Dispo: The patient is from: Home              Anticipated d/c is to: Home              Anticipated d/c date is: 1 day              Patient currently is not medically stable to d/c. Barriers: Not Clinically Stable- -rectal bleeding   With History of - Reviewed by me  Past Medical History:  Diagnosis Date  . Arteriosclerotic cardiovascular disease (ASCVD)    prior MI by echocardiography; stress nuclear -inferior scar; no prior cath  . Benign prostatic hypertrophy    s/p transurethral resection of the prostate  . Chronic kidney disease    Creatinine of 1.5 in 11/03 and 1.8 in 10/08; 1.75 in 09/2009  . Diabetes mellitus type 2, insulin dependent (HCC)   . Hyperlipidemia   . Hypertension   . Nephrolithiasis    surgical stone extractionx2  . Paroxysmal atrial fibrillation (HCC)  10/2009   onset in 10/2009; anticoagulation  . Tobacco abuse, in remission    25 pack years, discontinued in 1990      Past Surgical History:  Procedure Laterality Date  . Surgical removal  of renal calculi     3  . TRANSURETHRAL RESECTION OF PROSTATE        Chief Complaint  Patient presents with  . Rectal Bleeding      HPI:    Matthew Boyd  is a 86 y.o. male with past medical history relevant for CAD  - evidence of prior infarct from imaging (echo and MPI), never had an acute clinical event or cath, with preserved EF on prior echo, h/o PAFib, on Eliquis for anticoagulation PTA, HTN, dementia, DM2, CKD 3a, complains of constipation and rectal bleeding since 12/14/2019 after a very hard BM--patient has been straining to have a BM for the last 3 days--- he had blood on his stool and blood on the toilet tissue -Patient admits to noncompliance with stool softeners lately -Patient does not recall ever having a colonoscopy previously -Last dose of Eliquis was around 6 PM on 12/14/2019 -No chest pains, no palpitations no dizziness -No  vomiting Hgb 16.8>> 16.1>>16.2  With a creatinine of 1.94 and a BUN of 28, glucose is 177 -LFTs are not elevated -Patient is a poor historian, additional history obtained from patient's wife at bedside   Review of systems:    In addition to the HPI above,   A full Review of  Systems was done, all other systems reviewed are negative except as noted above in HPI , .    Social History:  Reviewed by me    Social History   Tobacco Use  . Smoking status: Former Smoker    Packs/day: 1.00    Years: 25.00    Pack years: 25.00    Types: Cigarettes    Start date: 08/29/1955    Quit date: 03/05/1988    Years since quitting: 31.8  . Smokeless tobacco: Never Used  . Tobacco comment: Smoked 25 years  Substance Use Topics  . Alcohol use: Not Currently    Alcohol/week: 0.0 standard drinks    Comment: Excessive use in the 1990s 04/08/14- no longer drinks      Family History :  Reviewed by me    Family History  Problem Relation Age of Onset  . Diabetes Mother   . Heart attack Father   . Heart disease Sister   . Diabetes Sister   . Colon cancer Neg Hx      Home Medications:   Prior to Admission medications   Medication Sig Start Date End Date Taking? Authorizing Provider  acetaminophen (TYLENOL) 325 MG tablet Take 325 mg by mouth in the morning and at bedtime.    Yes [provider]  amLODipine (NORVASC) 2.5 MG tablet Take 2.5 mg by mouth daily.   Yes [provider]  apixaban (ELIQUIS) 2.5 MG TABS tablet Take 1 tablet (2.5 mg total) by mouth 2 (two) times daily. 05/30/15  Yes Erick Blinks, MD  donepezil (ARICEPT) 10 MG tablet Take 10 mg by mouth at bedtime.  08/12/18  Yes [provider]  MELATONIN PO Take 5-10 mg by mouth daily as needed (sleep).   Yes [provider]  metoprolol (LOPRESSOR) 50 MG tablet Take 50 mg by mouth 2 (two) times daily.     Yes [provider]  QUEtiapine (SEROQUEL) 25 MG tablet Take 12.5 mg by mouth 2 (two)  times daily. 02/04/19  Yes [provider]  simvastatin (ZOCOR) 20 MG tablet Take 20 mg by mouth daily. 01/25/15  Yes [provider]  TRESIBA FLEXTOUCH 100 UNIT/ML SOPN FlexTouch Pen Inject 30 Units into  the skin daily.  11/19/17  Yes [provider]     Allergies:    No Known Allergies   Physical Exam:   Vitals  Blood pressure (!) 153/78, pulse 77, temperature 98.4 F (36.9 C), temperature source Oral, resp. rate 19, height 5\' 10"  (1.778 m), weight 85 kg, SpO2 100 %.  Physical Examination: General appearance - alert,  and in no distress Mental status - alert, oriented to person, place, and time,  Eyes - sclera anicteric Neck - supple, no JVD elevation , Chest - clear  to auscultation bilaterally, symmetrical air movement,  Heart - S1 and S2 normal, regular  Abdomen - soft, nontender, nondistended, no masses or organomegaly Neurological - screening mental status exam normal, neck supple without rigidity, cranial nerves II through XII intact, DTR's normal and symmetric Extremities - no pedal edema noted, intact peripheral pulses  Skin - warm, dry Rectal- no thrombosed hemorrhoids on exam, brown formed stool in rectal vault     Data Review:    CBC Recent Labs  Lab 12/15/19 0841 12/15/19 1423 12/15/19 1738  WBC 10.7*  --   --   HGB 16.8 16.1 16.2  HCT 50.3 47.5 47.4  PLT 226  --   --   MCV 98.8  --   --   MCH 33.0  --   --   MCHC 33.4  --   --   RDW 13.2  --   --   LYMPHSABS 1.2  --   --   MONOABS 0.8  --   --   EOSABS 0.1  --   --   BASOSABS 0.0  --   --    ------------------------------------------------------------------------------------------------------------------  Chemistries  Recent Labs  Lab 12/15/19 0841  NA 139  K 4.2  CL 99  CO2 26  GLUCOSE 177*  BUN 28*  CREATININE 1.94*  CALCIUM 9.2  AST 25  ALT 17  ALKPHOS 58  BILITOT 1.0    ------------------------------------------------------------------------------------------------------------------ estimated creatinine clearance is 29.3 mL/min (A) (by C-G formula based on SCr of 1.94 mg/dL (H)). ------------------------------------------------------------------------------------------------------------------ No results for input(s): TSH, T4TOTAL, T3FREE, THYROIDAB in the last 72 hours.  Invalid input(s): FREET3   Coagulation profile No results for input(s): INR, PROTIME in the last 168 hours. ------------------------------------------------------------------------------------------------------------------- No results for input(s): DDIMER in the last 72 hours. -------------------------------------------------------------------------------------------------------------------  Cardiac Enzymes No results for input(s): CKMB, TROPONINI, MYOGLOBIN in the last 168 hours.  Invalid input(s): CK ------------------------------------------------------------------------------------------------------------------ No results found for: BNP   ---------------------------------------------------------------------------------------------------------------  Urinalysis    Component Value Date/Time   COLORURINE YELLOW 05/24/2015 1815   APPEARANCEUR CLEAR 05/24/2015 1815   LABSPEC 1.015 05/24/2015 1815   PHURINE 5.0 05/24/2015 1815   GLUCOSEU NEGATIVE 05/24/2015 1815   HGBUR NEGATIVE 05/24/2015 1815   BILIRUBINUR SMALL (A) 05/24/2015 1815   KETONESUR NEGATIVE 05/24/2015 1815   PROTEINUR NEGATIVE 05/24/2015 1815   NITRITE NEGATIVE 05/24/2015 1815   LEUKOCYTESUR NEGATIVE 05/24/2015 1815     Imaging Results:    No results found.  Radiological Exams on Admission: No results found.  DVT Prophylaxis -SCD (Gi Bleed) AM Labs Ordered, also please review Full Orders  Family Communication: Admission, patients condition and plan of care including tests being ordered have been  discussed with the patient  who indicate understanding and agree with the plan   Code Status - Full Code  Likely DC to home if H&H remained stable  Condition   stable  05/26/2015 M.D on 12/15/2019 at 6:29 PM Go to [email or web address] -  for contact info  [facility] - Office  [phone]

## 2019-12-15 NOTE — ED Notes (Signed)
Attempted tap water enema on pt. Pt. Was unable to hold liquid. No blood present on what stool did come out.

## 2019-12-15 NOTE — ED Triage Notes (Signed)
Pt wife states that the pt has been having multiple bowel movements that started yesterday. Pt is having blood in his stool. Pt is on eliquis.This has happened before and pt had to receive 4 units of blood.

## 2019-12-15 NOTE — ED Provider Notes (Signed)
[facility] EMERGENCY DEPARTMENT Provider Note  CSN: [id number] Arrival date & time: 12/15/19 0720    History Chief Complaint  Patient presents with  . Rectal Bleeding    HPI  TIN ENGRAM is a 86 y.o. male with history of afib on Eliquis brought to the ED by wife for rectal bleeding. She states he has been straining recently to have BM but she noted bright red blood since yesterday. He has had similar in the past requiring blood transfusion. Patient denies CP, SOB, lightheadedness, dizziness or abdominal pain.    Past Medical History:  Diagnosis Date  . Arteriosclerotic cardiovascular disease (ASCVD)    prior MI by echocardiography; stress nuclear -inferior scar; no prior cath  . Benign prostatic hypertrophy    s/p transurethral resection of the prostate  . Chronic kidney disease    Creatinine of 1.5 in 11/03 and 1.8 in 10/08; 1.75 in 09/2009  . Diabetes mellitus type 2, insulin dependent (HCC)   . Hyperlipidemia   . Hypertension   . Nephrolithiasis    surgical stone extractionx2  . Paroxysmal atrial fibrillation (HCC)  10/2009   onset in 10/2009; anticoagulation  . Tobacco abuse, in remission    25 pack years, discontinued in 1990    Past Surgical History:  Procedure Laterality Date  . Surgical removal of renal calculi     3  . TRANSURETHRAL RESECTION OF PROSTATE      Family History  Problem Relation Age of Onset  . Diabetes Mother   . Heart attack Father   . Heart disease Sister   . Diabetes Sister     Social History   Tobacco Use  . Smoking status: Former Smoker    Packs/day: 1.00    Years: 25.00    Pack years: 25.00    Types: Cigarettes    Start date: 08/29/1955    Quit date: 03/05/1988    Years since quitting: 31.8  . Smokeless tobacco: Never Used  . Tobacco comment: Smoked 25 years  Vaping Use  . Vaping Use: Never used  Substance Use Topics  . Alcohol use: Not Currently    Alcohol/week: 0.0 standard drinks    Comment: Excessive use in the  1990s 04/08/14- no longer drinks   . Drug use: Not Currently     Home Medications Prior to Admission medications   Medication Sig Start Date End Date Taking? Authorizing Provider  acetaminophen (TYLENOL) 325 MG tablet Take 325 mg by mouth in the morning and at bedtime.    Yes [provider]  amLODipine (NORVASC) 2.5 MG tablet Take 2.5 mg by mouth daily.   Yes [provider]  apixaban (ELIQUIS) 2.5 MG TABS tablet Take 1 tablet (2.5 mg total) by mouth 2 (two) times daily. 05/30/15  Yes Erick Blinks, MD  donepezil (ARICEPT) 10 MG tablet Take 10 mg by mouth at bedtime.  08/12/18  Yes [provider]  MELATONIN PO Take 5-10 mg by mouth daily as needed (sleep).   Yes [provider]  metoprolol (LOPRESSOR) 50 MG tablet Take 50 mg by mouth 2 (two) times daily.     Yes [provider]  QUEtiapine (SEROQUEL) 25 MG tablet Take 12.5 mg by mouth 2 (two) times daily. 02/04/19  Yes [provider]  simvastatin (ZOCOR) 20 MG tablet Take 20 mg by mouth daily. 01/25/15  Yes [provider]  TRESIBA FLEXTOUCH 100 UNIT/ML SOPN FlexTouch Pen Inject 30 Units into the skin daily.  11/19/17  Yes [provider]     Allergies    Patient has no known allergies.   Review of Systems   Review of Systems A comprehensive review of systems was completed and negative except as noted in HPI.    Physical Exam BP 133/65   Pulse 90   Temp 98.4 F (36.9 C)   Resp (!) 22   Ht 5\' 10"  (1.778 m)   Wt 86.2 kg   SpO2 100%   BMI 27.26 kg/m   Physical Exam Vitals and nursing note reviewed.  Constitutional:      Appearance: Normal appearance.  HENT:     Head: Normocephalic and atraumatic.     Nose: Nose normal.     Mouth/Throat:     Mouth: Mucous membranes are moist.  Eyes:     Extraocular Movements: Extraocular movements intact.     Conjunctiva/sclera: Conjunctivae normal.  Cardiovascular:     Rate and Rhythm: Normal rate. Rhythm  irregular.  Pulmonary:     Effort: Pulmonary effort is normal.     Breath sounds: Normal breath sounds.  Abdominal:     General: Abdomen is flat.     Palpations: Abdomen is soft.     Tenderness: There is no abdominal tenderness.  Genitourinary:    Comments: Bright red blood in bedside commode Musculoskeletal:        General: No swelling. Normal range of motion.     Cervical back: Neck supple.  Skin:    General: Skin is warm and dry.  Neurological:     General: No focal deficit present.     Mental Status: He is alert.  Psychiatric:        Mood and Affect: Mood normal.      ED Results / Procedures / Treatments   Labs (all labs ordered are listed, but only abnormal results are displayed) Labs Reviewed  COMPREHENSIVE METABOLIC PANEL - Abnormal; Notable for the following components:      Result Value   Glucose, Bld 177 (*)    BUN 28 (*)    Creatinine, Ser 1.94 (*)    GFR, Estimated 31 (*)    All other components within normal limits  CBC WITH DIFFERENTIAL/PLATELET - Abnormal; Notable for the following components:   WBC 10.7 (*)    Neutro Abs 8.5 (*)    All other components within normal limits  POC OCCULT BLOOD, ED - Abnormal; Notable for the following components:   Fecal Occult Bld POSITIVE (*)    All other components within normal limits  RESPIRATORY PANEL BY RT PCR (FLU A&B, COVID)  HEMOGLOBIN AND HEMATOCRIT, BLOOD  HEMOGLOBIN AND HEMATOCRIT, BLOOD  HEMOGLOBIN AND HEMATOCRIT, BLOOD    EKG None  Radiology No results found.  Procedures Procedures  Medications Ordered in the ED Medications  pantoprazole (PROTONIX) injection 40 mg (has no administration in time range)     MDM Rules/Calculators/A&P MDM Patient with BRBPR while on Eliquis. Will check labs, hemodynamically stable.  ED Course  I have reviewed the triage vital signs and the nursing notes.  Pertinent labs & imaging results that were available during my care of the patient were reviewed by me  and considered in my medical decision making (see chart for details).  Clinical Course as of Dec 15 1031  Tue Dec 15, 2019  0856 Hgb is normal.    [CS]  [phone] CMP shows CKD, about at baseline. Will discuss with Hospitalist.    [CS]  [phone] Spoke with Dr. [id number], Hospitalist, who  will evaluate for admission.    [CS]    Clinical Course User Index [CS] Pollyann Savoy, MD    Final Clinical Impression(s) / ED Diagnoses Final diagnoses:  Rectal bleeding    Rx / DC Orders ED Discharge Orders    None       Pollyann Savoy, MD 12/15/19 [phone]

## 2019-12-16 DIAGNOSIS — Z7901 Long term (current) use of anticoagulants: Secondary | ICD-10-CM

## 2019-12-16 DIAGNOSIS — I10 Essential (primary) hypertension: Secondary | ICD-10-CM | POA: Diagnosis not present

## 2019-12-16 DIAGNOSIS — E785 Hyperlipidemia, unspecified: Secondary | ICD-10-CM | POA: Diagnosis present

## 2019-12-16 DIAGNOSIS — Z20822 Contact with and (suspected) exposure to covid-19: Secondary | ICD-10-CM | POA: Diagnosis present

## 2019-12-16 DIAGNOSIS — Z87891 Personal history of nicotine dependence: Secondary | ICD-10-CM | POA: Diagnosis not present

## 2019-12-16 DIAGNOSIS — E1122 Type 2 diabetes mellitus with diabetic chronic kidney disease: Secondary | ICD-10-CM | POA: Diagnosis present

## 2019-12-16 DIAGNOSIS — N179 Acute kidney failure, unspecified: Secondary | ICD-10-CM | POA: Diagnosis present

## 2019-12-16 DIAGNOSIS — K922 Gastrointestinal hemorrhage, unspecified: Secondary | ICD-10-CM

## 2019-12-16 DIAGNOSIS — E11649 Type 2 diabetes mellitus with hypoglycemia without coma: Secondary | ICD-10-CM | POA: Diagnosis not present

## 2019-12-16 DIAGNOSIS — N189 Chronic kidney disease, unspecified: Secondary | ICD-10-CM | POA: Diagnosis not present

## 2019-12-16 DIAGNOSIS — K625 Hemorrhage of anus and rectum: Secondary | ICD-10-CM | POA: Diagnosis present

## 2019-12-16 DIAGNOSIS — Z87442 Personal history of urinary calculi: Secondary | ICD-10-CM | POA: Diagnosis not present

## 2019-12-16 DIAGNOSIS — N1832 Chronic kidney disease, stage 3b: Secondary | ICD-10-CM | POA: Diagnosis present

## 2019-12-16 DIAGNOSIS — I2510 Atherosclerotic heart disease of native coronary artery without angina pectoris: Secondary | ICD-10-CM | POA: Diagnosis present

## 2019-12-16 DIAGNOSIS — I480 Paroxysmal atrial fibrillation: Secondary | ICD-10-CM | POA: Diagnosis not present

## 2019-12-16 DIAGNOSIS — R451 Restlessness and agitation: Secondary | ICD-10-CM | POA: Diagnosis present

## 2019-12-16 DIAGNOSIS — E119 Type 2 diabetes mellitus without complications: Secondary | ICD-10-CM

## 2019-12-16 DIAGNOSIS — E860 Dehydration: Secondary | ICD-10-CM | POA: Diagnosis present

## 2019-12-16 DIAGNOSIS — F0391 Unspecified dementia with behavioral disturbance: Secondary | ICD-10-CM | POA: Diagnosis present

## 2019-12-16 DIAGNOSIS — R531 Weakness: Secondary | ICD-10-CM | POA: Diagnosis present

## 2019-12-16 DIAGNOSIS — Z794 Long term (current) use of insulin: Secondary | ICD-10-CM

## 2019-12-16 DIAGNOSIS — Z833 Family history of diabetes mellitus: Secondary | ICD-10-CM | POA: Diagnosis not present

## 2019-12-16 DIAGNOSIS — I129 Hypertensive chronic kidney disease with stage 1 through stage 4 chronic kidney disease, or unspecified chronic kidney disease: Secondary | ICD-10-CM | POA: Diagnosis present

## 2019-12-16 DIAGNOSIS — K626 Ulcer of anus and rectum: Secondary | ICD-10-CM | POA: Diagnosis present

## 2019-12-16 DIAGNOSIS — T474X6A Underdosing of other laxatives, initial encounter: Secondary | ICD-10-CM | POA: Diagnosis present

## 2019-12-16 DIAGNOSIS — I252 Old myocardial infarction: Secondary | ICD-10-CM | POA: Diagnosis not present

## 2019-12-16 DIAGNOSIS — K5900 Constipation, unspecified: Secondary | ICD-10-CM | POA: Diagnosis present

## 2019-12-16 DIAGNOSIS — Z66 Do not resuscitate: Secondary | ICD-10-CM | POA: Diagnosis present

## 2019-12-16 DIAGNOSIS — Z91138 Patient's unintentional underdosing of medication regimen for other reason: Secondary | ICD-10-CM | POA: Diagnosis not present

## 2019-12-16 LAB — BASIC METABOLIC PANEL
Anion gap: 8 (ref 5–15)
BUN: 25 mg/dL — ABNORMAL HIGH (ref 8–23)
CO2: 25 mmol/L (ref 22–32)
Calcium: 8.9 mg/dL (ref 8.9–10.3)
Chloride: 109 mmol/L (ref 98–111)
Creatinine, Ser: 1.92 mg/dL — ABNORMAL HIGH (ref 0.61–1.24)
GFR, Estimated: 31 mL/min — ABNORMAL LOW (ref >60)
Glucose, Bld: 67 mg/dL — ABNORMAL LOW (ref 70–99)
Potassium: 5.6 mmol/L — ABNORMAL HIGH (ref 3.5–5.1)
Sodium: 142 mmol/L (ref 135–145)

## 2019-12-16 LAB — CBC
HCT: 47 % (ref 39.0–52.0)
Hemoglobin: 15.9 g/dL (ref 13.0–17.0)
MCH: 34 pg (ref 26.0–34.0)
MCHC: 33.8 g/dL (ref 30.0–36.0)
MCV: 100.4 fL — ABNORMAL HIGH (ref 80.0–100.0)
Platelets: 215 10*3/uL (ref 150–400)
RBC: 4.68 MIL/uL (ref 4.22–5.81)
RDW: 13.6 % (ref 11.5–15.5)
WBC: 13 10*3/uL — ABNORMAL HIGH (ref 4.0–10.5)
nRBC: 0 % (ref 0.0–0.2)

## 2019-12-16 LAB — GLUCOSE, CAPILLARY
Glucose-Capillary: 52 mg/dL — ABNORMAL LOW (ref 70–99)
Glucose-Capillary: 146 mg/dL — ABNORMAL HIGH (ref 70–99)
Glucose-Capillary: 248 mg/dL — ABNORMAL HIGH (ref 70–99)

## 2019-12-16 LAB — HEMOGLOBIN AND HEMATOCRIT, BLOOD
HCT: 46.9 % (ref 39.0–52.0)
Hemoglobin: 15.7 g/dL (ref 13.0–17.0)

## 2019-12-16 MED ORDER — DEXTROSE 50 % IV SOLN
INTRAVENOUS | Status: AC
  Administered 2019-12-16: 25 mL
  Filled 2019-12-16: qty 50

## 2019-12-16 MED ORDER — APIXABAN 2.5 MG PO TABS
2.5000 mg | ORAL_TABLET | Freq: Two times a day (BID) | ORAL | Status: DC
  Administered 2019-12-16 – 2019-12-17 (×2): 2.5 mg via ORAL
  Filled 2019-12-16 (×2): qty 1

## 2019-12-16 MED ORDER — QUETIAPINE FUMARATE 25 MG PO TABS
12.5000 mg | ORAL_TABLET | Freq: Once | ORAL | Status: AC
  Administered 2019-12-17: 12.5 mg via ORAL
  Filled 2019-12-16: qty 1

## 2019-12-16 MED ORDER — LORAZEPAM 2 MG/ML IJ SOLN
1.0000 mg | Freq: Once | INTRAMUSCULAR | Status: AC
  Administered 2019-12-16: 1 mg via INTRAVENOUS
  Filled 2019-12-16: qty 1

## 2019-12-16 MED ORDER — QUETIAPINE FUMARATE 25 MG PO TABS: 12.5000 mg | ORAL_TABLET | Freq: Two times a day (BID) | ORAL | Status: DC

## 2019-12-16 MED ORDER — QUETIAPINE FUMARATE 25 MG PO TABS
12.5000 mg | ORAL_TABLET | Freq: Every day | ORAL | Status: DC
  Administered 2019-12-17: 12.5 mg via ORAL
  Filled 2019-12-16: qty 1

## 2019-12-16 MED ORDER — QUETIAPINE FUMARATE 25 MG PO TABS
50.0000 mg | ORAL_TABLET | Freq: Every day | ORAL | Status: DC
  Administered 2019-12-16: 50 mg via ORAL
  Filled 2019-12-16: qty 2

## 2019-12-16 MED ORDER — MELATONIN 3 MG PO TABS
3.0000 mg | ORAL_TABLET | Freq: Once | ORAL | Status: AC
  Administered 2019-12-17: 3 mg via ORAL
  Filled 2019-12-16: qty 1

## 2019-12-16 MED ORDER — DEXTROSE-NACL 5-0.45 % IV SOLN: INTRAVENOUS | Status: DC

## 2019-12-16 NOTE — Progress Notes (Signed)
PROGRESS NOTE    Matthew Boyd  ZJI:[id number] DOB: 1934/01/01 DOA: 12/15/2019 PCP: [name], MD    Brief Narrative:  86 year old male with a history of paroxysmal atrial fibrillation on apixaban, dementia, hypertension, diabetes, presents to the hospital with rectal bleeding.  Patient had been having constipation prior to having blood in stools.  Hemoglobin has been stable.  He was admitted for further evaluation.   Assessment & Plan:   Principal Problem:   Acute GI bleeding Active Problems:   Paroxysmal atrial fibrillation (HCC)   Hypertension   Diabetes mellitus type 2, insulin dependent (HCC)   Chronic kidney disease   Chronic anticoagulation   Atrial fibrillation (HCC)   Rectal bleeding   GI bleed   1. Transient rectal bleeding.  In the setting of constipation.  Question stercoral ulcer.  Started on Anusol suppositories.  Seen by GI.  Since no further bleeding and hemoglobin stable, no plans on colonoscopy at this time.  If he has recurrence of bleeding, may need to consider colonoscopy at that time. 2. Paroxysmal atrial fibrillation.  Heart rate is stable on metoprolol.  Resume Eliquis. 3. Dementia with agitation.  At baseline, wife reports that patient is intermittently confused, but pleasant.  Currently, mental status is confused and agitated and not at baseline.  He is requiring intermittent IV medications.  We will increase his nightly Seroquel dosing. 4. Diabetes.  He was having episodes of hypoglycemia this morning.  Basal insulin is currently on hold.  Started on dextrose infusion.  Advance diet and monitor blood sugars. 5. Hypertension.  Continue amlodipine and metoprolol. 6. Acute kidney injury.  Currently on IV fluids.  Creatinine at 1.9.  Baseline creatinine is unknown since prior labs are from 2017.  Likely has chronic kidney disease stage IIIb.  Continue to monitor. 7. Generalized weakness.  Physical therapy evaluation requested   DVT prophylaxis: apixaban  (ELIQUIS) tablet 2.5 mg Start: 12/16/19 2200 SCDs Start: 12/15/19 1825 Place TED hose Start: 12/15/19 1825 SCDs Start: 12/15/19 1821 Place TED hose Start: 12/15/19 1821 apixaban (ELIQUIS) tablet 2.5 mg  Code Status: Full code Family Communication: Updated wife at the bedside Disposition Plan: Status is: Inpatient  Remains inpatient appropriate because:Altered mental status   Dispo: The patient is from: Home              Anticipated d/c is to: TBD              Anticipated d/c date is: 1 day              Patient currently is not medically stable to d/c.    Consultants:   Gastroenterology  Procedures:     Antimicrobials:       Subjective: Patient has not had any further blood per rectum.  He was agitated overnight and required medications for agitation.  He removed his IV.  He was trying to get out of bed.  Objective: Vitals:   12/15/19 1500 12/15/19 1600 12/15/19 1725 12/15/19 2038  BP: 122/68 101/90 (!) 153/78 (!) 141/80  Pulse: 84  77 (!) 101  Resp: 17  19 18   Temp:   98.4 F (36.9 C) 98.2 F (36.8 C)  TempSrc:   Oral   SpO2: 100%  100% 96%  Weight:   85 kg   Height:        Intake/Output Summary (Last 24 hours) at 12/16/2019 1920 Last data filed at 12/16/2019 1330 Gross per 24 hour  Intake 505.83 ml  Output --  Net 505.83 ml   Filed Weights   12/15/19 0744 12/15/19 1725  Weight: 86.2 kg 85 kg    Examination:  General exam: Lying in bed, no distress Respiratory system: Clear to auscultation. Respiratory effort normal. Cardiovascular system: S1 & S2 heard, RRR. No JVD, murmurs, rubs, gallops or clicks. No pedal edema. Gastrointestinal system: Abdomen is nondistended, soft and nontender. No organomegaly or masses felt. Normal bowel sounds heard. Central nervous system: Alert and oriented. No focal neurological deficits. Extremities: Symmetric 5 x 5 power. Skin: No rashes, lesions or ulcers Psychiatry: Confused, agitated    Data Reviewed: I have  personally reviewed following labs and imaging studies  CBC: Recent Labs  Lab 12/15/19 0841 12/15/19 0841 12/15/19 1423 12/15/19 1738 12/15/19 2213 12/16/19 0241 12/16/19 1503  WBC 10.7*  --   --   --   --  13.0*  --   NEUTROABS 8.5*  --   --   --   --   --   --   HGB 16.8   < > 16.1 16.2 15.8 15.9 15.7  HCT 50.3   < > 47.5 47.4 46.4 47.0 46.9  MCV 98.8  --   --   --   --  100.4*  --   PLT 226  --   --   --   --  215  --    < > = values in this interval not displayed.   Basic Metabolic Panel: Recent Labs  Lab 12/15/19 0841 12/16/19 0241  NA 139 142  K 4.2 5.6*  CL 99 109  CO2 26 25  GLUCOSE 177* 67*  BUN 28* 25*  CREATININE 1.94* 1.92*  CALCIUM 9.2 8.9   GFR: Estimated Creatinine Clearance: 29.6 mL/min (A) (by C-G formula based on SCr of 1.92 mg/dL (H)). Liver Function Tests: Recent Labs  Lab 12/15/19 0841  AST 25  ALT 17  ALKPHOS 58  BILITOT 1.0  PROT 7.5  ALBUMIN 4.0   No results for input(s): LIPASE, AMYLASE in the last 168 hours. No results for input(s): AMMONIA in the last 168 hours. Coagulation Profile: No results for input(s): INR, PROTIME in the last 168 hours. Cardiac Enzymes: No results for input(s): CKTOTAL, CKMB, CKMBINDEX, TROPONINI in the last 168 hours. BNP (last 3 results) No results for input(s): PROBNP in the last 8760 hours. HbA1C: Recent Labs    12/15/19 0826  HGBA1C 6.6*   CBG: Recent Labs  Lab 12/15/19 2041 12/16/19 0617 12/16/19 1436  GLUCAP 159* 52* 146*   Lipid Profile: No results for input(s): CHOL, HDL, LDLCALC, TRIG, CHOLHDL, LDLDIRECT in the last 72 hours. Thyroid Function Tests: No results for input(s): TSH, T4TOTAL, FREET4, T3FREE, THYROIDAB in the last 72 hours. Anemia Panel: No results for input(s): VITAMINB12, FOLATE, FERRITIN, TIBC, IRON, RETICCTPCT in the last 72 hours. Sepsis Labs: No results for input(s): PROCALCITON, LATICACIDVEN in the last 168 hours.  Recent Results (from the past 240 hour(s))   Respiratory Panel by RT PCR (Flu A&B, Covid) - Nasopharyngeal Swab     Status: None   Collection Time: 12/15/19  9:45 AM   Specimen: Nasopharyngeal Swab  Result Value Ref Range Status   SARS Coronavirus 2 by RT PCR NEGATIVE NEGATIVE Final    Comment: (NOTE) SARS-CoV-2 target nucleic acids are NOT DETECTED.  The SARS-CoV-2 RNA is generally detectable in upper respiratoy specimens during the acute phase of infection. The lowest concentration of SARS-CoV-2 viral copies this assay can detect is 131 copies/mL. A negative result  does not preclude SARS-Cov-2 infection and should not be used as the sole basis for treatment or other patient management decisions. A negative result may occur with  improper specimen collection/handling, submission of specimen other than nasopharyngeal swab, presence of viral mutation(s) within the areas targeted by this assay, and inadequate number of viral copies (<131 copies/mL). A negative result must be combined with clinical observations, patient history, and epidemiological information. The expected result is Negative.  Fact Sheet for Patients:  https://www.moore.com/  Fact Sheet for Healthcare Providers:  https://www.young.biz/  This test is no t yet approved or cleared by the [place] FDA and  has been authorized for detection and/or diagnosis of SARS-CoV-2 by FDA under an Emergency Use Authorization (EUA). This EUA will remain  in effect (meaning this test can be used) for the duration of the COVID-19 declaration under Section 564(b)(1) of the Act, 21 U.S.C. section 360bbb-3(b)(1), unless the authorization is terminated or revoked sooner.     Influenza A by PCR NEGATIVE NEGATIVE Final   Influenza B by PCR NEGATIVE NEGATIVE Final    Comment: (NOTE) The Xpert Xpress SARS-CoV-2/FLU/RSV assay is intended as an aid in  the diagnosis of influenza from Nasopharyngeal swab specimens and  should not be used as  a sole basis for treatment. Nasal washings and  aspirates are unacceptable for Xpert Xpress SARS-CoV-2/FLU/RSV  testing.  Fact Sheet for Patients: https://www.moore.com/  Fact Sheet for Healthcare Providers: https://www.young.biz/  This test is not yet approved or cleared by the [place] FDA and  has been authorized for detection and/or diagnosis of SARS-CoV-2 by  FDA under an Emergency Use Authorization (EUA). This EUA will remain  in effect (meaning this test can be used) for the duration of the  Covid-19 declaration under Section 564(b)(1) of the Act, 21  U.S.C. section 360bbb-3(b)(1), unless the authorization is  terminated or revoked. Performed at [facility], [address] [place], [place] [address]          Radiology Studies: No results found.      Scheduled Meds: . amLODipine  2.5 mg Oral Daily  . apixaban  2.5 mg Oral BID  . donepezil  10 mg Oral QHS  . hydrocortisone  25 mg Rectal BID  . influenza vaccine adjuvanted  0.5 mL Intramuscular Tomorrow-1000  . insulin aspart  0-5 Units Subcutaneous QHS  . insulin aspart  0-6 Units Subcutaneous TID WC  . metoprolol tartrate  50 mg Oral BID  . pantoprazole (PROTONIX) IV  40 mg Intravenous Q12H  . polyethylene glycol  17 g Oral BID  . QUEtiapine  12.5 mg Oral BID  . simvastatin  20 mg Oral Daily  . sodium chloride flush  3 mL Intravenous Q12H  . sodium chloride flush  3 mL Intravenous Q12H  . sodium chloride flush  3 mL Intravenous Q12H  . traZODone  50 mg Oral QHS   Continuous Infusions: . sodium chloride Stopped (12/16/19 1117)  . dextrose 5 % and 0.45% NaCl 100 mL/hr at 12/16/19 1640     LOS: 0 days    Time spent:    Erick Blinks, MD [facility]   If 7PM-7AM, please contact night-coverage [email or web address]  12/16/2019, 7:20 PM

## 2019-12-16 NOTE — Progress Notes (Addendum)
Hypoglycemic Event  CBG: 52  Treatment: D50 25 mL (12.5 gm)  Symptoms: Nervous/irritable  Follow-up CBG: Time: CBG Result:  Possible Reasons for Event: Unknown  Comments/MD notified: O. Leeroy Bock, Dorie Rank
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
## 2019-12-16 NOTE — Care Management Obs Status (Signed)
MEDICARE OBSERVATION STATUS NOTIFICATION   Patient Details  Name: Matthew Boyd MRN: [id number] Date of Birth: Sep 29, 1933   Medicare Observation Status Notification Given:  Yes    Corey Harold 12/16/2019, 3:59 PM

## 2019-12-16 NOTE — Progress Notes (Signed)
GI Inpatient Follow-up Note  Patient Identification: MARKHI KLECKNER is a 86 y.o. male w/ PMHx dementia, hypertension, hyperlipidemia, chronic kidney disease, atrial fib on Eliquis with prior MI.  Admitted 12/15/2019 for rectal bleeding.  Subjective: Wife provides all history.  Patient is currently sleeping. Received IV Ativan overnight for combativeness & agitation.   Wife does endorse he had several good bowel movements yesterday and denies any abdominal pain or blood in stool.  He tolerated dinner well last ngiht but has not had breakfast yet this morning.  She has no concerns for GI. Wife denies any prior colonoscopy.  Also discussed care with nurse  Scheduled Inpatient Medications:  . amLODipine  2.5 mg Oral Daily  . donepezil  10 mg Oral QHS  . hydrocortisone  25 mg Rectal BID  . influenza vaccine adjuvanted  0.5 mL Intramuscular Tomorrow-1000  . insulin aspart  0-5 Units Subcutaneous QHS  . insulin aspart  0-6 Units Subcutaneous TID WC  . metoprolol tartrate  50 mg Oral BID  . pantoprazole (PROTONIX) IV  40 mg Intravenous Q12H  . polyethylene glycol  17 g Oral BID  . QUEtiapine  12.5 mg Oral BID  . simvastatin  20 mg Oral Daily  . sodium chloride flush  3 mL Intravenous Q12H  . sodium chloride flush  3 mL Intravenous Q12H  . sodium chloride flush  3 mL Intravenous Q12H  . traZODone  50 mg Oral QHS    Continuous Inpatient Infusions:   . sodium chloride    . dextrose 5 % and 0.45% NaCl 100 mL/hr at 12/16/19 1117    PRN Inpatient Medications:  sodium chloride, acetaminophen **OR** acetaminophen, bisacodyl, ondansetron **OR** ondansetron (ZOFRAN) IV, polyethylene glycol, sodium chloride flush  Review of Systems: Constitutional: Weight is stable.  Eyes: No changes in vision. ENT: No oral lesions, sore throat.  GI: see HPI.  Heme/Lymph: No easy bruising.  CV: No chest pain.  GU: No hematuria.  Integumentary: No rashes.  Neuro: No headaches.  Psych: +  agitation Endocrine: No heat/cold intolerance.  Allergic/Immunologic: No urticaria.  Resp: No cough, SOB.  Musculoskeletal: No joint swelling.    Physical Examination: BP (!) 141/80   Pulse (!) 101   Temp 98.2 F (36.8 C)   Resp 18   Ht 5\' 10"  (1.778 m)   Wt 85 kg   SpO2 96%   BMI 26.89 kg/m  Gen: NAD, patient sleeping Exam not completed request of wife.  Data: Lab Results  Component Value Date   WBC 13.0 (H) 12/16/2019   HGB 15.9 12/16/2019   HCT 47.0 12/16/2019   MCV 100.4 (H) 12/16/2019   PLT 215 12/16/2019   Recent Labs  Lab 12/15/19 1738 12/15/19 2213 12/16/19 0241  HGB 16.2 15.8 15.9   Lab Results  Component Value Date   NA 142 12/16/2019   K 5.6 (H) 12/16/2019   CL 109 12/16/2019   CO2 25 12/16/2019   BUN 25 (H) 12/16/2019   CREATININE 1.92 (H) 12/16/2019   GLU 122 06/11/2015   Lab Results  Component Value Date   ALT 17 12/15/2019   AST 25 12/15/2019   ALKPHOS 58 12/15/2019   BILITOT 1.0 12/15/2019   No results for input(s): APTT, INR, PTT in the last 168 hours.    Assessment/Plan: Mr. Kuechle is a 86 y.o. male with past medical history of dementia admitted for rectal bleeding  1.  Rectal bleeding-has resolved since admission. Currently on MiraLAX twice daily and anusol BID.  No bleeding with bowel movements yesterday, felt hx of stercoral ulcer. Hgb stable.  Will hold off on further evaluation unless has hemoglobin drop or continued rectal bleeding.  Long-term will need bowel regimen at discharge-previously was on stool softener but not taking recently.  Would recommend MiraLAX 2 times a day to prevent further issues.   Case discussed w/ Dr Karilyn Cota. Given patient doing well will sign off. Please call with questions or concerns.    Bluford Kaufmann, PA-C [facility]
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
## 2019-12-16 NOTE — Progress Notes (Signed)
Patient climbing out of bed and pulling at tubes and wires.  On-call MD notified.  New orders received and carried out.  Will continue to monitor.

## 2019-12-17 DIAGNOSIS — E119 Type 2 diabetes mellitus without complications: Secondary | ICD-10-CM | POA: Diagnosis not present

## 2019-12-17 DIAGNOSIS — N189 Chronic kidney disease, unspecified: Secondary | ICD-10-CM

## 2019-12-17 DIAGNOSIS — K922 Gastrointestinal hemorrhage, unspecified: Secondary | ICD-10-CM | POA: Diagnosis not present

## 2019-12-17 DIAGNOSIS — I480 Paroxysmal atrial fibrillation: Secondary | ICD-10-CM | POA: Diagnosis not present

## 2019-12-17 LAB — GLUCOSE, CAPILLARY
Glucose-Capillary: 120 mg/dL — ABNORMAL HIGH (ref 70–99)
Glucose-Capillary: 243 mg/dL — ABNORMAL HIGH (ref 70–99)
Glucose-Capillary: 227 mg/dL — ABNORMAL HIGH (ref 70–99)

## 2019-12-17 MED ORDER — HYDROCORTISONE ACETATE 25 MG RE SUPP: 25.0000 mg | Freq: Two times a day (BID) | RECTAL | 0 refills | Status: DC

## 2019-12-17 MED ORDER — POLYETHYLENE GLYCOL 3350 17 G PO PACK: 17.0000 g | PACK | Freq: Two times a day (BID) | ORAL | 0 refills | Status: DC

## 2019-12-17 NOTE — Evaluation (Signed)
Physical Therapy Evaluation Patient Details Name: Matthew Boyd MRN: [id number] DOB: 11-Aug-1933 Today's Date: 12/17/2019   History of Present Illness  Matthew Boyd  is a 86 y.o. male with past medical history relevant for CAD  - evidence of prior infarct from imaging (echo and MPI), never had an acute clinical event or cath, with preserved EF on prior echo, h/o PAFib, on Eliquis for anticoagulation PTA, HTN, dementia, DM2, CKD 3a, complains of constipation and rectal bleeding since 12/14/2019 after a very hard BM--patient has been straining to have a BM for the last 3 days--- he had blood on his stool and blood on the toilet tissue-Patient admits to noncompliance with stool softeners lately-Patient does not recall ever having a colonoscopy previously-Last dose of Eliquis was around 6 PM on 12/14/2019-No chest pains, no palpitations no dizziness-No vomiting    Clinical Impression  Patient demonstrates slow labored movement for sitting up at bedside requiring frequent verbal/tactile cueing for proper hand placement with fair carryover, able to ambulate up to doorway and back to bedside with slow labored unsteady cadence, had most difficulty making turns and required constant verbal/tactile cueing to avoid loss of balance.  Patient tolerated sitting up in chair with his spouse present after therapy.  Patient will benefit from continued physical therapy in hospital and recommended venue below to increase strength, balance, endurance for safe ADLs and gait.     Follow Up Recommendations Home health PT;Supervision for mobility/OOB;Supervision/Assistance - 24 hour    Equipment Recommendations  None recommended by PT    Recommendations for Other Services       Precautions / Restrictions Precautions Precautions: Fall Restrictions Weight Bearing Restrictions: No      Mobility  Bed Mobility Overal bed mobility: Needs Assistance Bed Mobility: Supine to Sit     Supine to sit: Mod assist      General bed mobility comments: slow labored movement with difficulty using BUE due to weakness  Transfers Overall transfer level: Needs assistance   Transfers: Sit to/from Stand;Stand Pivot Transfers Sit to Stand: Min assist;Mod assist Stand pivot transfers: Mod assist       General transfer comment: required repeated verbal/tactile cueing to follow instructions  Ambulation/Gait Ambulation/Gait assistance: Min assist;Mod assist Gait Distance (Feet): 22 Feet Assistive device: Rolling walker (2 wheeled) Gait Pattern/deviations: Decreased step length - right;Decreased step length - left;Decreased stride length Gait velocity: decreased   General Gait Details: slow labored unsteady movement, required increased time to make turns and limited due to weakness/fall risk  Stairs            Wheelchair Mobility    Modified Rankin (Stroke Patients Only)       Balance Overall balance assessment: Needs assistance Sitting-balance support: Feet supported;No upper extremity supported Sitting balance-Leahy Scale: Fair Sitting balance - Comments: fair/good seated at EOB   Standing balance support: During functional activity;Bilateral upper extremity supported Standing balance-Leahy Scale: Fair Standing balance comment: using RW                             Pertinent Vitals/Pain Pain Assessment: Faces Faces Pain Scale: Hurts a little bit Pain Location: BLE Pain Descriptors / Indicators: Aching;Sore;Discomfort Pain Intervention(s): Limited activity within patient's tolerance;Monitored during session    Home Living Family/patient expects to be discharged to:: Private residence Living Arrangements: Spouse/significant other Available Help at Discharge: Family Type of Home: Mobile home Home Access: Stairs to enter Entrance Stairs-Rails: Right;Left;Can reach both Entrance [employer]  of Steps: 3 Home Layout: One level Home Equipment: Walker - 2 wheels;Cane -  single point;Bedside commode;Shower seat      Prior Function Level of Independence: Needs assistance   Gait / Transfers Assistance Needed: household ambulator occasionally leaning on nearby objects or using SPC  ADL's / Homemaking Assistance Needed: assisted by spouse        Hand Dominance        Extremity/Trunk Assessment   Upper Extremity Assessment Upper Extremity Assessment: Generalized weakness    Lower Extremity Assessment Lower Extremity Assessment: Generalized weakness    Cervical / Trunk Assessment Cervical / Trunk Assessment: Normal  Communication   Communication: No difficulties  Cognition Arousal/Alertness: Awake/alert Behavior During Therapy: Anxious Overall Cognitive Status: History of cognitive impairments - at baseline                                 General Comments: requires repeated verbal/tactile cueing to follow instructions      General Comments      Exercises     Assessment/Plan    PT Assessment Patient needs continued PT services  PT Problem List Decreased strength;Decreased activity tolerance;Decreased balance;Decreased mobility;Decreased safety awareness       PT Treatment Interventions Balance training;Gait training;Stair training;Functional mobility training;Therapeutic activities;Therapeutic exercise;Patient/family education    PT Goals (Current goals can be found in the Care Plan section)  Acute Rehab PT Goals Patient Stated Goal: return home with family to assist PT Goal Formulation: With patient/family Time For Goal Achievement: 12/24/19 Potential to Achieve Goals: Good    Frequency Min 3X/week   Barriers to discharge        Co-evaluation               AM-PAC PT "6 Clicks" Mobility  Outcome Measure Help needed turning from your back to your side while in a flat bed without using bedrails?: A Little Help needed moving from lying on your back to sitting on the side of a flat bed without using  bedrails?: A Lot Help needed moving to and from a bed to a chair (including a wheelchair)?: A Lot Help needed standing up from a chair using your arms (e.g., wheelchair or bedside chair)?: A Lot Help needed to walk in hospital room?: A Lot Help needed climbing 3-5 steps with a railing? : A Lot 6 Click Score: 13    End of Session Equipment Utilized During Treatment: Gait belt Activity Tolerance: Patient tolerated treatment well;Patient limited by fatigue Patient left: in chair;with call bell/phone within reach Nurse Communication: Mobility status PT Visit Diagnosis: Unsteadiness on feet (R26.81);Other abnormalities of gait and mobility (R26.89);Muscle weakness (generalized) (M62.81)    Time: 0174-9449 PT Time Calculation (min) (ACUTE ONLY): 31 min   Charges:   PT Evaluation $PT Eval Moderate Complexity: 1 Mod PT Treatments $Therapeutic Activity: 23-37 mins        2:06 PM, 12/17/19 Ocie Bob, MPT Physical Therapist with [facility] [phone] office [phone] mobile phone

## 2019-12-17 NOTE — Plan of Care (Signed)
  Problem: Acute Rehab PT Goals(only PT should resolve) Goal: Pt Will Go Supine/Side To Sit Outcome: Progressing Flowsheets (Taken 12/17/2019 1408) Pt will go Supine/Side to Sit: with minimal assist Goal: Patient Will Transfer Sit To/From Stand Outcome: Progressing Flowsheets (Taken 12/17/2019 1408) Patient will transfer sit to/from stand:  with min guard assist  with minimal assist Goal: Pt Will Transfer Bed To Chair/Chair To Bed Outcome: Progressing Flowsheets (Taken 12/17/2019 1408) Pt will Transfer Bed to Chair/Chair to Bed: with min assist Goal: Pt Will Ambulate Outcome: Progressing Flowsheets (Taken 12/17/2019 1408) Pt will Ambulate:  50 feet  with minimal assist  with rolling walker   2:09 PM, 12/17/19 Ocie Bob, MPT Physical Therapist with [facility] [phone] office [phone] mobile phone

## 2019-12-17 NOTE — TOC Transition Note (Signed)
Transition of Care [facility]) - CM/SW Discharge Note  Patient Details  Name: Matthew Boyd MRN: [id number] Date of Birth: 02/08/1934  Transition of Care [facility]) CM/SW Contact:  Ewing Schlein, LCSW Phone Number: 12/17/2019, 3:30 PM  Clinical Narrative: Patient will be discharging today. TOC notified patient will need HH (PT, RN, aide). CSW made referrals to [facility], [facility], and [place]. Referral was declined. CSW made referral to [facility] with [facility]. [facility] accepted [facility]. Orders have been placed. HH added to AVS. TOC signing off.  Final next level of care: Home w Home Health Services Barriers to Discharge: Barriers Resolved  Patient Goals and CMS Choice Patient states their goals for this hospitalization and ongoing recovery are:: Discharge home with [facility] CMS Medicare.gov Compare Post Acute Care list provided to:: Patient Represenative (must comment) [name] (wife)) Choice offered to / list presented to : Spouse  Discharge Plan and Services      DME Arranged: N/A DME Agency: NA HH Arranged: PT, RN, Nurse's Aide HH Agency: [facility] Date HH Agency Contacted: 12/17/19 Time HH Agency Contacted: 1459 Representative spoke with at [facility] Agency: Bonita Quin  Readmission Risk Interventions No flowsheet data found.

## 2019-12-17 NOTE — Discharge Summary (Signed)
Physician Discharge Summary  Matthew Boyd MVH:[id number] DOB: 1933-03-30 DOA: 12/15/2019  PCP: Pearson GrippeKim, James, MD  Admit date: 12/15/2019 Discharge date: 12/17/2019  Admitted From: Home Disposition: Home  Recommendations for Outpatient Follow-up:  1. Follow up with PCP in 1-2 weeks 2. Please obtain BMP/CBC in one week 3. Follow-up with gastroenterology as needed  Home Health: Home health RN, PT, aide Equipment/Devices:  Discharge Condition: Stable CODE STATUS: DNR Diet recommendation: Heart healthy, carb modified  Brief/Interim Summary: 86 year old male with a history of paroxysmal atrial fibrillation on apixaban, dementia, hypertension, diabetes, presents to the hospital with rectal bleeding.  Patient had been having constipation prior to having blood in stools.  Hemoglobin has been stable.  He was admitted for further evaluation.  Discharge Diagnoses:  Principal Problem:   Acute GI bleeding Active Problems:   Paroxysmal atrial fibrillation (HCC)   Hypertension   Diabetes mellitus type 2, insulin dependent (HCC)   Chronic kidney disease   Chronic anticoagulation   Atrial fibrillation (HCC)   Rectal bleeding   GI bleed  1. Transient rectal bleeding.  In the setting of constipation.  Question stercoral ulcer.  Started on Anusol suppositories.  Seen by GI.  Since no further bleeding and hemoglobin stable, no plans on colonoscopy at this time.  If he has recurrence of bleeding, may need to consider colonoscopy at that time. It was felt the patient to follow-up with GI as needed 2. Paroxysmal atrial fibrillation.  Heart rate is stable on metoprolol.  Resume Eliquis. 3. Dementia with agitation.  At baseline, wife reports that patient is intermittently confused, but pleasant.   He had worsening mental status after admission. Seroquel dose was increased. He is now calm, but still confused. His wife feels that he is approaching his baseline mental status and would be 4. Diabetes.  He  was having episodes of hypoglycemia this morning.  Basal insulin is currently on hold.   Blood sugars improved after dextrose infusion. He is now tolerating a solid diet. Anticipate that his blood sugar should stabilize. His wife reports that his blood sugars normally run between 70-120 at home. 5. Hypertension.  Continue amlodipine and metoprolol. 6. Acute kidney injury.  Currently on IV fluids.  Creatinine at 1.9.  Baseline creatinine is unknown since prior labs are from 2017.  Likely has chronic kidney disease stage IIIb.   Renal function can be followed up as an outpatient. 7. Generalized weakness.   Seen by physical therapy with recommendations for home health PT  Discharge Instructions  Discharge Instructions    Diet - low sodium heart healthy   Complete by: As directed    Increase activity slowly   Complete by: As directed      Allergies as of 12/17/2019   No Known Allergies     Medication List    TAKE these medications   acetaminophen 325 MG tablet Commonly known as: TYLENOL Take 325 mg by mouth in the morning and at bedtime.   amLODipine 2.5 MG tablet Commonly known as: NORVASC Take 2.5 mg by mouth daily.   apixaban 2.5 MG Tabs tablet Commonly known as: ELIQUIS Take 1 tablet (2.5 mg total) by mouth 2 (two) times daily.   donepezil 10 MG tablet Commonly known as: ARICEPT Take 10 mg by mouth at bedtime.   hydrocortisone 25 MG suppository Commonly known as: ANUSOL-HC Place 1 suppository (25 mg total) rectally 2 (two) times daily.   MELATONIN PO Take 5-10 mg by mouth daily as needed (sleep).  metoprolol tartrate 50 MG tablet Commonly known as: LOPRESSOR Take 50 mg by mouth 2 (two) times daily.   polyethylene glycol 17 g packet Commonly known as: MIRALAX / GLYCOLAX Take 17 g by mouth 2 (two) times daily.   QUEtiapine 25 MG tablet Commonly known as: SEROQUEL Take 12.5 mg by mouth 2 (two) times daily.   simvastatin 20 MG tablet Commonly known as:  ZOCOR Take 20 mg by mouth daily.   Evaristo Bury FlexTouch 100 UNIT/ML FlexTouch Pen Generic drug: insulin degludec Inject 30 Units into the skin daily.       Follow-up Information    [facility] Follow [facility].   Specialty: Home Health Services Why: PT, RN, Aide             No Known Allergies  Consultations:     Procedures/Studies:  No results found.    Subjective:   Discharge Exam: Vitals:   12/15/19 2038 12/16/19 1943 12/17/19 0649 12/17/19 1419  BP: (!) 141/80 (!) 132/110 (!) 127/96 113/76  Pulse: (!) 101 76 84 82  Resp: 18 20 20 18   Temp: 98.2 F (36.8 C) 98.4 F (36.9 C) 98.6 F (37 C) 98.5 F (36.9 C)  TempSrc:  Oral  Oral  SpO2: 96% 100% 100% 100%  Weight:      Height:        General: Pt is alert, awake, not in acute distress Cardiovascular: RRR, S1/S2 +, no rubs, no gallops Respiratory: CTA bilaterally, no wheezing, no rhonchi Abdominal: Soft, NT, ND, bowel sounds + Extremities: no edema, no cyanosis    The results of significant diagnostics from this hospitalization (including imaging, microbiology, ancillary and laboratory) are listed below for reference.     Microbiology: Recent Results (from the past 240 hour(s))  Respiratory Panel by RT PCR (Flu A&B, Covid) - Nasopharyngeal Swab     Status: None   Collection Time: 12/15/19  9:45 AM   Specimen: Nasopharyngeal Swab  Result Value Ref Range Status   SARS Coronavirus 2 by RT PCR NEGATIVE NEGATIVE Final    Comment: (NOTE) SARS-CoV-2 target nucleic acids are NOT DETECTED.  The SARS-CoV-2 RNA is generally detectable in upper respiratoy specimens during the acute phase of infection. The lowest concentration of SARS-CoV-2 viral copies this assay can detect is 131 copies/mL. A negative result does not preclude SARS-Cov-2 infection and should not be used as the sole basis for treatment or other patient management decisions. A negative result may occur with  improper specimen  collection/handling, submission of specimen other than nasopharyngeal swab, presence of viral mutation(s) within the areas targeted by this assay, and inadequate number of viral copies (<131 copies/mL). A negative result must be combined with clinical observations, patient history, and epidemiological information. The expected result is Negative.  Fact Sheet for Patients:  02/14/20  Fact Sheet for Healthcare Providers:  https://www.moore.com/  This test is no t yet approved or cleared by the https://www.young.biz/ FDA and  has been authorized for detection and/or diagnosis of SARS-CoV-2 by FDA under an Emergency Use Authorization (EUA). This EUA will remain  in effect (meaning this test can be used) for the duration of the COVID-19 declaration under Section 564(b)(1) of the Act, 21 U.S.C. section 360bbb-3(b)(1), unless the authorization is terminated or revoked sooner.     Influenza A by PCR NEGATIVE NEGATIVE Final   Influenza B by PCR NEGATIVE NEGATIVE Final    Comment: (NOTE) The Xpert Xpress SARS-CoV-2/FLU/RSV assay is intended as an aid in  the  diagnosis of influenza from Nasopharyngeal swab specimens and  should not be used as a sole basis for treatment. Nasal washings and  aspirates are unacceptable for Xpert Xpress SARS-CoV-2/FLU/RSV  testing.  Fact Sheet for Patients: https://www.moore.com/  Fact Sheet for Healthcare Providers: https://www.young.biz/  This test is not yet approved or cleared by the [place] FDA and  has been authorized for detection and/or diagnosis of SARS-CoV-2 by  FDA under an Emergency Use Authorization (EUA). This EUA will remain  in effect (meaning this test can be used) for the duration of the  Covid-19 declaration under Section 564(b)(1) of the Act, 21  U.S.C. section 360bbb-3(b)(1), unless the authorization is  terminated or revoked. Performed at [facility], [address] [place], [place] [address]      Labs: BNP (last 3 results) No results for input(s): BNP in the last 8760 hours. Basic Metabolic Panel: Recent Labs  Lab 12/15/19 0841 12/16/19 0241  NA 139 142  K 4.2 5.6*  CL 99 109  CO2 26 25  GLUCOSE 177* 67*  BUN 28* 25*  CREATININE 1.94* 1.92*  CALCIUM 9.2 8.9   Liver Function Tests: Recent Labs  Lab 12/15/19 0841  AST 25  ALT 17  ALKPHOS 58  BILITOT 1.0  PROT 7.5  ALBUMIN 4.0   No results for input(s): LIPASE, AMYLASE in the last 168 hours. No results for input(s): AMMONIA in the last 168 hours. CBC: Recent Labs  Lab 12/15/19 0841 12/15/19 0841 12/15/19 1423 12/15/19 1738 12/15/19 2213 12/16/19 0241 12/16/19 1503  WBC 10.7*  --   --   --   --  13.0*  --   NEUTROABS 8.5*  --   --   --   --   --   --   HGB 16.8   < > 16.1 16.2 15.8 15.9 15.7  HCT 50.3   < > 47.5 47.4 46.4 47.0 46.9  MCV 98.8  --   --   --   --  100.4*  --   PLT 226  --   --   --   --  215  --    < > = values in this interval not displayed.   Cardiac Enzymes: No results for input(s): CKTOTAL, CKMB, CKMBINDEX, TROPONINI in the last 168 hours. BNP: Invalid input(s): POCBNP CBG: Recent Labs  Lab 12/16/19 1436 12/16/19 1948 12/17/19 0739 12/17/19 1104 12/17/19 1611  GLUCAP 146* 248* 120* 243* 227*   D-Dimer No results for input(s): DDIMER in the last 72 hours. Hgb A1c Recent Labs    12/15/19 0826  HGBA1C 6.6*   Lipid Profile No results for input(s): CHOL, HDL, LDLCALC, TRIG, CHOLHDL, LDLDIRECT in the last 72 hours. Thyroid function studies No results for input(s): TSH, T4TOTAL, T3FREE, THYROIDAB in the last 72 hours.  Invalid input(s): FREET3 Anemia work up No results for input(s): VITAMINB12, FOLATE, FERRITIN, TIBC, IRON, RETICCTPCT in the last 72 hours. Urinalysis    Component Value Date/Time   COLORURINE YELLOW 05/24/2015 1815   APPEARANCEUR CLEAR 05/24/2015 1815   LABSPEC 1.015 05/24/2015 1815   PHURINE  5.0 05/24/2015 1815   GLUCOSEU NEGATIVE 05/24/2015 1815   HGBUR NEGATIVE 05/24/2015 1815   BILIRUBINUR SMALL (A) 05/24/2015 1815   KETONESUR NEGATIVE 05/24/2015 1815   PROTEINUR NEGATIVE 05/24/2015 1815   NITRITE NEGATIVE 05/24/2015 1815   LEUKOCYTESUR NEGATIVE 05/24/2015 1815   Sepsis Labs Invalid input(s): PROCALCITONIN,  WBC,  LACTICIDVEN Microbiology Recent Results (from the past 240 hour(s))  Respiratory Panel  by RT PCR (Flu A&B, Covid) - Nasopharyngeal Swab     Status: None   Collection Time: 12/15/19  9:45 AM   Specimen: Nasopharyngeal Swab  Result Value Ref Range Status   SARS Coronavirus 2 by RT PCR NEGATIVE NEGATIVE Final    Comment: (NOTE) SARS-CoV-2 target nucleic acids are NOT DETECTED.  The SARS-CoV-2 RNA is generally detectable in upper respiratoy specimens during the acute phase of infection. The lowest concentration of SARS-CoV-2 viral copies this assay can detect is 131 copies/mL. A negative result does not preclude SARS-Cov-2 infection and should not be used as the sole basis for treatment or other patient management decisions. A negative result may occur with  improper specimen collection/handling, submission of specimen other than nasopharyngeal swab, presence of viral mutation(s) within the areas targeted by this assay, and inadequate number of viral copies (<131 copies/mL). A negative result must be combined with clinical observations, patient history, and epidemiological information. The expected result is Negative.  Fact Sheet for Patients:  https://www.moore.com/  Fact Sheet for Healthcare Providers:  https://www.young.biz/  This test is no t yet approved or cleared by the [place] FDA and  has been authorized for detection and/or diagnosis of SARS-CoV-2 by FDA under an Emergency Use Authorization (EUA). This EUA will remain  in effect (meaning this test can be used) for the duration of the COVID-19  declaration under Section 564(b)(1) of the Act, 21 U.S.C. section 360bbb-3(b)(1), unless the authorization is terminated or revoked sooner.     Influenza A by PCR NEGATIVE NEGATIVE Final   Influenza B by PCR NEGATIVE NEGATIVE Final    Comment: (NOTE) The Xpert Xpress SARS-CoV-2/FLU/RSV assay is intended as an aid in  the diagnosis of influenza from Nasopharyngeal swab specimens and  should not be used as a sole basis for treatment. Nasal washings and  aspirates are unacceptable for Xpert Xpress SARS-CoV-2/FLU/RSV  testing.  Fact Sheet for Patients: https://www.moore.com/  Fact Sheet for Healthcare Providers: https://www.young.biz/  This test is not yet approved or cleared by the [place] FDA and  has been authorized for detection and/or diagnosis of SARS-CoV-2 by  FDA under an Emergency Use Authorization (EUA). This EUA will remain  in effect (meaning this test can be used) for the duration of the  Covid-19 declaration under Section 564(b)(1) of the Act, 21  U.S.C. section 360bbb-3(b)(1), unless the authorization is  terminated or revoked. Performed at [facility], [address], [place], [place] [address]      Time coordinating discharge:  SIGNED:   Erick Blinks, MD  [facility] 12/17/2019, 9:02 PM   If 7PM-7AM, please contact night-coverage [email or web address]

## 2019-12-18 ENCOUNTER — Encounter (HOSPITAL_COMMUNITY): Payer: Self-pay

## 2019-12-18 ENCOUNTER — Other Ambulatory Visit: Payer: Self-pay

## 2019-12-18 ENCOUNTER — Inpatient Hospital Stay (HOSPITAL_COMMUNITY)
Admission: EM | Admit: 2019-12-18 | Discharge: 2019-12-23 | DRG: 948 | Disposition: A | Discharge status: Skilled Nursing Facility | Payer: Medicare Other | Attending: Internal Medicine | Admitting: Internal Medicine

## 2019-12-18 DIAGNOSIS — R531 Weakness: Principal | ICD-10-CM | POA: Diagnosis present

## 2019-12-18 DIAGNOSIS — E871 Hypo-osmolality and hyponatremia: Secondary | ICD-10-CM | POA: Diagnosis present

## 2019-12-18 DIAGNOSIS — Z66 Do not resuscitate: Secondary | ICD-10-CM | POA: Diagnosis present

## 2019-12-18 DIAGNOSIS — I252 Old myocardial infarction: Secondary | ICD-10-CM

## 2019-12-18 DIAGNOSIS — N400 Enlarged prostate without lower urinary tract symptoms: Secondary | ICD-10-CM | POA: Diagnosis present

## 2019-12-18 DIAGNOSIS — E785 Hyperlipidemia, unspecified: Secondary | ICD-10-CM | POA: Diagnosis present

## 2019-12-18 DIAGNOSIS — K219 Gastro-esophageal reflux disease without esophagitis: Secondary | ICD-10-CM | POA: Diagnosis present

## 2019-12-18 DIAGNOSIS — I480 Paroxysmal atrial fibrillation: Secondary | ICD-10-CM | POA: Diagnosis present

## 2019-12-18 DIAGNOSIS — G309 Alzheimer's disease, unspecified: Secondary | ICD-10-CM | POA: Diagnosis present

## 2019-12-18 DIAGNOSIS — E11649 Type 2 diabetes mellitus with hypoglycemia without coma: Secondary | ICD-10-CM | POA: Diagnosis not present

## 2019-12-18 DIAGNOSIS — I10 Essential (primary) hypertension: Secondary | ICD-10-CM | POA: Diagnosis not present

## 2019-12-18 DIAGNOSIS — I482 Chronic atrial fibrillation, unspecified: Secondary | ICD-10-CM | POA: Diagnosis not present

## 2019-12-18 DIAGNOSIS — M1A9XX0 Chronic gout, unspecified, without tophus (tophi): Secondary | ICD-10-CM | POA: Diagnosis present

## 2019-12-18 DIAGNOSIS — R195 Other fecal abnormalities: Secondary | ICD-10-CM | POA: Diagnosis present

## 2019-12-18 DIAGNOSIS — Z20822 Contact with and (suspected) exposure to covid-19: Secondary | ICD-10-CM | POA: Diagnosis present

## 2019-12-18 DIAGNOSIS — Z87891 Personal history of nicotine dependence: Secondary | ICD-10-CM

## 2019-12-18 DIAGNOSIS — E1122 Type 2 diabetes mellitus with diabetic chronic kidney disease: Secondary | ICD-10-CM | POA: Diagnosis present

## 2019-12-18 DIAGNOSIS — D72829 Elevated white blood cell count, unspecified: Secondary | ICD-10-CM | POA: Diagnosis present

## 2019-12-18 DIAGNOSIS — I2510 Atherosclerotic heart disease of native coronary artery without angina pectoris: Secondary | ICD-10-CM | POA: Diagnosis present

## 2019-12-18 DIAGNOSIS — E1165 Type 2 diabetes mellitus with hyperglycemia: Secondary | ICD-10-CM | POA: Diagnosis present

## 2019-12-18 DIAGNOSIS — Z87442 Personal history of urinary calculi: Secondary | ICD-10-CM | POA: Diagnosis not present

## 2019-12-18 DIAGNOSIS — F0391 Unspecified dementia with behavioral disturbance: Secondary | ICD-10-CM | POA: Diagnosis not present

## 2019-12-18 DIAGNOSIS — Z8679 Personal history of other diseases of the circulatory system: Secondary | ICD-10-CM

## 2019-12-18 DIAGNOSIS — Z7901 Long term (current) use of anticoagulants: Secondary | ICD-10-CM

## 2019-12-18 DIAGNOSIS — Z794 Long term (current) use of insulin: Secondary | ICD-10-CM

## 2019-12-18 DIAGNOSIS — E1169 Type 2 diabetes mellitus with other specified complication: Secondary | ICD-10-CM | POA: Diagnosis not present

## 2019-12-18 DIAGNOSIS — N183 Chronic kidney disease, stage 3 unspecified: Secondary | ICD-10-CM | POA: Diagnosis present

## 2019-12-18 DIAGNOSIS — Z833 Family history of diabetes mellitus: Secondary | ICD-10-CM | POA: Diagnosis not present

## 2019-12-18 DIAGNOSIS — Z79899 Other long term (current) drug therapy: Secondary | ICD-10-CM

## 2019-12-18 DIAGNOSIS — F0281 Dementia in other diseases classified elsewhere with behavioral disturbance: Secondary | ICD-10-CM | POA: Diagnosis present

## 2019-12-18 DIAGNOSIS — Z8249 Family history of ischemic heart disease and other diseases of the circulatory system: Secondary | ICD-10-CM

## 2019-12-18 DIAGNOSIS — I129 Hypertensive chronic kidney disease with stage 1 through stage 4 chronic kidney disease, or unspecified chronic kidney disease: Secondary | ICD-10-CM | POA: Diagnosis present

## 2019-12-18 LAB — CBC WITH DIFFERENTIAL/PLATELET
Abs Immature Granulocytes: 0.09 10*3/uL — ABNORMAL HIGH (ref 0.00–0.07)
Basophils Absolute: 0 10*3/uL (ref 0.0–0.1)
Basophils Relative: 0 %
Eosinophils Absolute: 0 10*3/uL (ref 0.0–0.5)
Eosinophils Relative: 0 %
HCT: 45.4 % (ref 39.0–52.0)
Hemoglobin: 15.4 g/dL (ref 13.0–17.0)
Immature Granulocytes: 1 %
Lymphocytes Relative: 7 %
Lymphs Abs: 0.9 10*3/uL (ref 0.7–4.0)
MCH: 33.2 pg (ref 26.0–34.0)
MCHC: 33.9 g/dL (ref 30.0–36.0)
MCV: 97.8 fL (ref 80.0–100.0)
Monocytes Absolute: 1.1 10*3/uL — ABNORMAL HIGH (ref 0.1–1.0)
Monocytes Relative: 9 %
Neutro Abs: 10.6 10*3/uL — ABNORMAL HIGH (ref 1.7–7.7)
Neutrophils Relative %: 83 %
Platelets: 211 10*3/uL (ref 150–400)
RBC: 4.64 MIL/uL (ref 4.22–5.81)
RDW: 13.3 % (ref 11.5–15.5)
WBC: 12.8 10*3/uL — ABNORMAL HIGH (ref 4.0–10.5)
nRBC: 0 % (ref 0.0–0.2)

## 2019-12-18 LAB — RESP PANEL BY RT PCR (RSV, FLU A&B, COVID)
Influenza A by PCR: NEGATIVE
Influenza B by PCR: NEGATIVE
Respiratory Syncytial Virus by PCR: NEGATIVE
SARS Coronavirus 2 by RT PCR: NEGATIVE

## 2019-12-18 LAB — CBG MONITORING, ED: Glucose-Capillary: 214 mg/dL — ABNORMAL HIGH (ref 70–99)

## 2019-12-18 LAB — BASIC METABOLIC PANEL
Anion gap: 10 (ref 5–15)
BUN: 24 mg/dL — ABNORMAL HIGH (ref 8–23)
CO2: 23 mmol/L (ref 22–32)
Calcium: 8.5 mg/dL — ABNORMAL LOW (ref 8.9–10.3)
Chloride: 104 mmol/L (ref 98–111)
Creatinine, Ser: 1.77 mg/dL — ABNORMAL HIGH (ref 0.61–1.24)
GFR, Estimated: 35 mL/min — ABNORMAL LOW (ref >60)
Glucose, Bld: 207 mg/dL — ABNORMAL HIGH (ref 70–99)
Potassium: 4.2 mmol/L (ref 3.5–5.1)
Sodium: 137 mmol/L (ref 135–145)

## 2019-12-18 LAB — LACTIC ACID, PLASMA: Lactic Acid, Venous: 1.8 mmol/L (ref 0.5–1.9)

## 2019-12-18 MED ORDER — SODIUM CHLORIDE 0.9 % IV BOLUS
1000.0000 mL | Freq: Once | INTRAVENOUS | Status: AC
  Administered 2019-12-18: 1000 mL via INTRAVENOUS

## 2019-12-18 MED ORDER — LABETALOL HCL 5 MG/ML IV SOLN
10.0000 mg | Freq: Once | INTRAVENOUS | Status: AC
  Administered 2019-12-18: 10 mg via INTRAVENOUS
  Filled 2019-12-18: qty 4

## 2019-12-18 MED ORDER — LORAZEPAM 2 MG/ML IJ SOLN
0.5000 mg | Freq: Once | INTRAMUSCULAR | Status: AC
  Administered 2019-12-18: 0.5 mg via INTRAVENOUS
  Filled 2019-12-18: qty 1

## 2019-12-18 NOTE — ED Triage Notes (Signed)
Pt to er via ems, per ems pt was recently admitted for a Gi bleed, states that he is here today because he couldn't walk since he got out of the hospital, states that home health came and the said since he couldn't walk he should go back to the er.

## 2019-12-18 NOTE — ED Provider Notes (Signed)
[facility] EMERGENCY DEPARTMENT Provider Note   CSN: [id number] Arrival date & time: 12/18/19  1742     History Chief Complaint  Patient presents with   Weakness    Matthew Boyd is a 86 y.o. male with a history of paroxysmal atrial fibrillation, hypertension, diabetes, chronic kidney disease who was discharged from the hospital here yesterday where he was worked up for an acute GI bleed.  Pt has dementia and cannot offer any medical history.  Conversation with wife per telephone revealing for patient having profoundly worse weakness since his hospitalization.  She states he was able to ambulate into the emergency department the date he was admitted, but coming home he had to be assisted by EMS into his home and has been in his bed unable to ambulate since being discharged yesterday.  Home health had been arranged and this caregiver came today but on assessment stated he is too weak to be able to undergo the physical therapy that was ordered for him.  Wife also states that with the previous admission about 5 years ago he had to undergo rehabilitation prior to coming home.  She states in his current condition of weakness she cannot safely care for him at home.  Level 5 caveat secondary to dementia.  HPI     Past Medical History:  Diagnosis Date   Arteriosclerotic cardiovascular disease (ASCVD)    prior MI by echocardiography; stress nuclear -inferior scar; no prior cath   Benign prostatic hypertrophy    s/p transurethral resection of the prostate   Chronic kidney disease    Creatinine of 1.5 in 11/03 and 1.8 in 10/08; 1.75 in 09/2009   Diabetes mellitus type 2, insulin dependent (HCC)    Hyperlipidemia    Hypertension    Nephrolithiasis    surgical stone extractionx2   Paroxysmal atrial fibrillation (HCC)  10/2009   onset in 10/2009; anticoagulation   Tobacco abuse, in remission    25 pack years, discontinued in 1990    Patient Active Problem List   Diagnosis Date  Noted   Generalized weakness 12/18/2019   GI bleed 12/16/2019   Acute GI bleeding 12/15/2019   Rectal bleeding    Constipation    Finger pain 06/11/2015   Acute gout 05/30/2015   Protein-calorie malnutrition, severe 05/25/2015   Supratherapeutic INR 05/24/2015   Acute renal failure superimposed on stage 3 chronic kidney disease (HCC) 05/24/2015   Atrial fibrillation (HCC) 05/24/2015   Degenerative joint disease 02/28/2012   Chronic anticoagulation 02/08/2011   Arteriosclerotic cardiovascular disease (ASCVD)    Hypertension    Hyperlipidemia    Diabetes mellitus type 2, insulin dependent (HCC)    Nephrolithiasis    Chronic kidney disease    Tobacco abuse, in remission    Paroxysmal atrial fibrillation (HCC) 10/03/2009    Past Surgical History:  Procedure Laterality Date   Surgical removal of renal calculi     3   TRANSURETHRAL RESECTION OF PROSTATE         Family History  Problem Relation Age of Onset   Diabetes Mother    Heart attack Father    Heart disease Sister    Diabetes Sister    Colon cancer Neg Hx     Social History   Tobacco Use   Smoking status: Former Smoker    Packs/day: 1.00    Years: 25.00    Pack years: 25.00    Types: Cigarettes    Start date: 08/29/1955    Quit date:  03/05/1988    Years since quitting: 31.8   Smokeless tobacco: Never Used   Tobacco comment: Smoked 25 years  Vaping Use   Vaping Use: Never used  Substance Use Topics   Alcohol use: Not Currently    Alcohol/week: 0.0 standard drinks    Comment: Excessive use in the 1990s 04/08/14- no longer drinks    Drug use: Not Currently    Home Medications Prior to Admission medications   Medication Sig Start Date End Date Taking? Authorizing Provider  acetaminophen (TYLENOL) 325 MG tablet Take 325 mg by mouth in the morning and at bedtime.     [provider]  amLODipine (NORVASC) 2.5 MG tablet Take 2.5 mg by mouth daily.    [provider]  apixaban (ELIQUIS) 2.5 MG TABS tablet Take 1 tablet (2.5 mg total) by mouth 2 (two) times daily. 05/30/15   Erick Blinks, MD  donepezil (ARICEPT) 10 MG tablet Take 10 mg by mouth at bedtime.  08/12/18   [provider]  hydrocortisone (ANUSOL-HC) 25 MG suppository Place 1 suppository (25 mg total) rectally 2 (two) times daily. 12/17/19   Erick Blinks, MD  MELATONIN PO Take 5-10 mg by mouth daily as needed (sleep).    [provider]  metoprolol (LOPRESSOR) 50 MG tablet Take 50 mg by mouth 2 (two) times daily.      [provider]  polyethylene glycol (MIRALAX / GLYCOLAX) 17 g packet Take 17 g by mouth 2 (two) times daily. 12/17/19   Erick Blinks, MD  QUEtiapine (SEROQUEL) 25 MG tablet Take 12.5 mg by mouth 2 (two) times daily. 02/04/19   [provider]  simvastatin (ZOCOR) 20 MG tablet Take 20 mg by mouth daily. 01/25/15   [provider]  TRESIBA FLEXTOUCH 100 UNIT/ML SOPN FlexTouch Pen Inject 30 Units into the skin daily.  11/19/17   [provider]    Allergies    Patient has no known allergies.  Review of Systems   Review of Systems  Unable to perform ROS: Dementia    Physical Exam Updated Vital Signs BP (!) 125/50 (BP Location: Right Arm)    Pulse 95    Temp 98.4 F (36.9 C) (Oral)    Resp 12    SpO2 97%   Physical Exam Vitals and nursing note reviewed. Exam conducted with a chaperone present.  Constitutional:      General: He is not in acute distress.    Appearance: He is well-developed.  HENT:     Head: Normocephalic and atraumatic.     Mouth/Throat:     Pharynx: Oropharynx is clear.  Eyes:     Conjunctiva/sclera: Conjunctivae normal.  Cardiovascular:     Rate and Rhythm: Regular rhythm. Tachycardia present.     Heart sounds: Normal heart sounds.  Pulmonary:     Effort: Pulmonary effort is normal.     Breath sounds: Normal breath sounds. No wheezing or rhonchi.  Abdominal:     General: Bowel  sounds are normal. There is no distension.     Palpations: Abdomen is soft.     Tenderness: There is no abdominal tenderness. There is no guarding.  Genitourinary:    Rectum: Guaiac result positive.     Comments: Patient is patient is Hemoccult positive, brown stool, no visible blood. Musculoskeletal:        General: Normal range of motion.     Cervical back: Normal range of motion.  Skin:    General: Skin is warm and  dry.     Capillary Refill: Capillary refill takes 2 to 3 seconds.     Comments:  Skin appears dry.  Neurological:     Mental Status: He is alert. He is disoriented and confused.     Comments: Moves all extremities moving all extremities.     ED Results / Procedures / Treatments   Labs (all labs ordered are listed, but only abnormal results are displayed) Labs Reviewed  BASIC METABOLIC PANEL - Abnormal; Notable for the following components:      Result Value   Glucose, Bld 207 (*)    BUN 24 (*)    Creatinine, Ser 1.77 (*)    Calcium 8.5 (*)    GFR, Estimated 35 (*)    All other components within normal limits  CBC WITH DIFFERENTIAL/PLATELET - Abnormal; Notable for the following components:   WBC 12.8 (*)    Neutro Abs 10.6 (*)    Monocytes Absolute 1.1 (*)    Abs Immature Granulocytes 0.09 (*)    All other components within normal limits  CBG MONITORING, ED - Abnormal; Notable for the following components:   Glucose-Capillary 214 (*)    All other components within normal limits  CULTURE, BLOOD (ROUTINE X 2)  CULTURE, BLOOD (ROUTINE X 2)  RESP PANEL BY RT PCR (RSV, FLU A&B, COVID)  LACTIC ACID, PLASMA  URINALYSIS, ROUTINE W REFLEX MICROSCOPIC  POC OCCULT BLOOD, ED    EKG EKG Interpretation  Date/Time:  Friday December 18 2019 19:35:03 EDT Ventricular Rate:  124 PR Interval:    QRS Duration: 74 QT Interval:  322 QTC Calculation: 462 R Axis:   67 Text Interpretation: Atrial fibrillation with rapid ventricular response with premature ventricular or  aberrantly conducted complexes Septal infarct , age undetermined Abnormal ECG Confirmed by Pricilla Loveless [id number]) on 12/18/2019 10:48:33 PM   Radiology No results found.  Procedures Procedures (including critical care time)  Medications Ordered in ED Medications  sodium chloride 0.9 % bolus 1,000 mL (0 mLs Intravenous Stopped 12/18/19 2145)  LORazepam (ATIVAN) injection 0.5 mg (0.5 mg Intravenous Given 12/18/19 2247)  labetalol (NORMODYNE) injection 10 mg (10 mg Intravenous Given 12/18/19 2248)    ED Course  I have reviewed the triage vital signs and the nursing notes.  Pertinent labs & imaging results that were available during my care of the patient were reviewed by me and considered in my medical decision making (see chart for details).    MDM Rules/Calculators/A&P                          Pt with weakness and failure to thrive after being discharged from here yesterday.  After discussion with wife,  Pt will probably require rehab placement prior to returning to his home. He is still hemoccult positive, but hgb remains stable.  Pt discussed with Dr. Carren Rang who accepts pt for admission.    Final Clinical Impression(s) / ED Diagnoses Final diagnoses:  Generalized weakness  Occult blood in stools    Rx / DC Orders ED Discharge Orders    None       Victoriano Lain 12/18/19 2313    Pricilla Loveless, MD 12/19/19 [phone]

## 2019-12-19 ENCOUNTER — Inpatient Hospital Stay (HOSPITAL_COMMUNITY): Payer: Medicare Other

## 2019-12-19 ENCOUNTER — Encounter (HOSPITAL_COMMUNITY): Payer: Self-pay | Admitting: Family Medicine

## 2019-12-19 ENCOUNTER — Other Ambulatory Visit: Payer: Self-pay

## 2019-12-19 DIAGNOSIS — R531 Weakness: Secondary | ICD-10-CM | POA: Diagnosis not present

## 2019-12-19 LAB — CBC
HCT: 42.4 % (ref 39.0–52.0)
HCT: 47.8 % (ref 39.0–52.0)
HCT: 45.4 % (ref 39.0–52.0)
Hemoglobin: 14.1 g/dL (ref 13.0–17.0)
Hemoglobin: 15.7 g/dL (ref 13.0–17.0)
Hemoglobin: 14.8 g/dL (ref 13.0–17.0)
MCH: 33.6 pg (ref 26.0–34.0)
MCH: 33.2 pg (ref 26.0–34.0)
MCH: 32.9 pg (ref 26.0–34.0)
MCHC: 33.3 g/dL (ref 30.0–36.0)
MCHC: 32.8 g/dL (ref 30.0–36.0)
MCHC: 32.6 g/dL (ref 30.0–36.0)
MCV: 101 fL — ABNORMAL HIGH (ref 80.0–100.0)
MCV: 101.1 fL — ABNORMAL HIGH (ref 80.0–100.0)
MCV: 100.9 fL — ABNORMAL HIGH (ref 80.0–100.0)
Platelets: 198 10*3/uL (ref 150–400)
Platelets: 234 10*3/uL (ref 150–400)
Platelets: 254 10*3/uL (ref 150–400)
RBC: 4.2 MIL/uL — ABNORMAL LOW (ref 4.22–5.81)
RBC: 4.73 MIL/uL (ref 4.22–5.81)
RBC: 4.5 MIL/uL (ref 4.22–5.81)
RDW: 13.4 % (ref 11.5–15.5)
RDW: 13.4 % (ref 11.5–15.5)
RDW: 13.3 % (ref 11.5–15.5)
WBC: 11.6 10*3/uL — ABNORMAL HIGH (ref 4.0–10.5)
WBC: 12.1 10*3/uL — ABNORMAL HIGH (ref 4.0–10.5)
WBC: 13.1 10*3/uL — ABNORMAL HIGH (ref 4.0–10.5)
nRBC: 0 % (ref 0.0–0.2)
nRBC: 0 % (ref 0.0–0.2)
nRBC: 0 % (ref 0.0–0.2)

## 2019-12-19 LAB — COMPREHENSIVE METABOLIC PANEL
ALT: 15 U/L (ref 0–44)
AST: 25 U/L (ref 15–41)
Albumin: 3.1 g/dL — ABNORMAL LOW (ref 3.5–5.0)
Alkaline Phosphatase: 59 U/L (ref 38–126)
Anion gap: 14 (ref 5–15)
BUN: 24 mg/dL — ABNORMAL HIGH (ref 8–23)
CO2: 22 mmol/L (ref 22–32)
Calcium: 8.6 mg/dL — ABNORMAL LOW (ref 8.9–10.3)
Chloride: 105 mmol/L (ref 98–111)
Creatinine, Ser: 1.69 mg/dL — ABNORMAL HIGH (ref 0.61–1.24)
GFR, Estimated: 36 mL/min — ABNORMAL LOW (ref >60)
Glucose, Bld: 180 mg/dL — ABNORMAL HIGH (ref 70–99)
Potassium: 4.6 mmol/L (ref 3.5–5.1)
Sodium: 141 mmol/L (ref 135–145)
Total Bilirubin: 1.5 mg/dL — ABNORMAL HIGH (ref 0.3–1.2)
Total Protein: 6.5 g/dL (ref 6.5–8.1)

## 2019-12-19 LAB — CBG MONITORING, ED
Glucose-Capillary: 175 mg/dL — ABNORMAL HIGH (ref 70–99)
Glucose-Capillary: 141 mg/dL — ABNORMAL HIGH (ref 70–99)
Glucose-Capillary: 182 mg/dL — ABNORMAL HIGH (ref 70–99)
Glucose-Capillary: 173 mg/dL — ABNORMAL HIGH (ref 70–99)

## 2019-12-19 LAB — MAGNESIUM: Magnesium: 1.9 mg/dL (ref 1.7–2.4)

## 2019-12-19 MED ORDER — LORAZEPAM 2 MG/ML IJ SOLN
1.0000 mg | Freq: Once | INTRAMUSCULAR | Status: AC
  Administered 2019-12-19: 1 mg via INTRAVENOUS
  Filled 2019-12-19: qty 1

## 2019-12-19 MED ORDER — INSULIN ASPART 100 UNIT/ML ~~LOC~~ SOLN
0.0000 [IU] | Freq: Three times a day (TID) | SUBCUTANEOUS | Status: DC
  Administered 2019-12-19: 3 [IU] via SUBCUTANEOUS
  Administered 2019-12-19: 2 [IU] via SUBCUTANEOUS
  Administered 2019-12-19: 3 [IU] via SUBCUTANEOUS
  Administered 2019-12-21: 5 [IU] via SUBCUTANEOUS
  Administered 2019-12-21: 2 [IU] via SUBCUTANEOUS
  Administered 2019-12-22: 3 [IU] via SUBCUTANEOUS
  Filled 2019-12-19 (×3): qty 1

## 2019-12-19 MED ORDER — ONDANSETRON HCL 4 MG/2ML IJ SOLN: 4.0000 mg | Freq: Four times a day (QID) | INTRAMUSCULAR | Status: DC | PRN

## 2019-12-19 MED ORDER — HYDROCORTISONE ACETATE 25 MG RE SUPP
25.0000 mg | Freq: Two times a day (BID) | RECTAL | Status: DC
  Administered 2019-12-19 – 2019-12-22 (×8): 25 mg via RECTAL
  Filled 2019-12-19 (×8): qty 1

## 2019-12-19 MED ORDER — METOPROLOL TARTRATE 50 MG PO TABS
50.0000 mg | ORAL_TABLET | Freq: Two times a day (BID) | ORAL | Status: DC
  Administered 2019-12-19 – 2019-12-22 (×8): 50 mg via ORAL
  Filled 2019-12-19 (×8): qty 1

## 2019-12-19 MED ORDER — QUETIAPINE FUMARATE 25 MG PO TABS
12.5000 mg | ORAL_TABLET | Freq: Two times a day (BID) | ORAL | Status: DC
  Administered 2019-12-19 – 2019-12-20 (×3): 12.5 mg via ORAL
  Filled 2019-12-19 (×3): qty 1

## 2019-12-19 MED ORDER — PANTOPRAZOLE SODIUM 40 MG IV SOLR
40.0000 mg | Freq: Two times a day (BID) | INTRAVENOUS | Status: DC
  Administered 2019-12-19 – 2019-12-20 (×3): 40 mg via INTRAVENOUS
  Filled 2019-12-19 (×3): qty 40

## 2019-12-19 MED ORDER — HEPARIN SODIUM (PORCINE) 5000 UNIT/ML IJ SOLN
5000.0000 [IU] | Freq: Three times a day (TID) | INTRAMUSCULAR | Status: DC
  Administered 2019-12-19 – 2019-12-20 (×4): 5000 [IU] via SUBCUTANEOUS
  Filled 2019-12-19 (×4): qty 1

## 2019-12-19 MED ORDER — ACETAMINOPHEN 650 MG RE SUPP: 650.0000 mg | Freq: Four times a day (QID) | RECTAL | Status: DC | PRN

## 2019-12-19 MED ORDER — ONDANSETRON HCL 4 MG PO TABS: 4.0000 mg | ORAL_TABLET | Freq: Four times a day (QID) | ORAL | Status: DC | PRN

## 2019-12-19 MED ORDER — POLYETHYLENE GLYCOL 3350 17 G PO PACK
17.0000 g | PACK | Freq: Two times a day (BID) | ORAL | Status: DC
  Administered 2019-12-19 – 2019-12-22 (×8): 17 g via ORAL
  Filled 2019-12-19 (×8): qty 1

## 2019-12-19 MED ORDER — DONEPEZIL HCL 5 MG PO TABS
10.0000 mg | ORAL_TABLET | Freq: Every day | ORAL | Status: DC
  Administered 2019-12-19 – 2019-12-22 (×4): 10 mg via ORAL
  Filled 2019-12-19 (×6): qty 2

## 2019-12-19 MED ORDER — INSULIN ASPART 100 UNIT/ML ~~LOC~~ SOLN
0.0000 [IU] | Freq: Every day | SUBCUTANEOUS | Status: DC
  Administered 2019-12-20: 3 [IU] via SUBCUTANEOUS
  Administered 2019-12-21: 4 [IU] via SUBCUTANEOUS

## 2019-12-19 MED ORDER — INSULIN DETEMIR 100 UNIT/ML ~~LOC~~ SOLN
25.0000 [IU] | Freq: Every day | SUBCUTANEOUS | Status: DC
  Administered 2019-12-19 – 2019-12-22 (×4): 25 [IU] via SUBCUTANEOUS
  Filled 2019-12-19 (×5): qty 0.25

## 2019-12-19 MED ORDER — ACETAMINOPHEN 325 MG PO TABS
650.0000 mg | ORAL_TABLET | Freq: Four times a day (QID) | ORAL | Status: DC | PRN
  Administered 2019-12-20 – 2019-12-21 (×3): 650 mg via ORAL
  Filled 2019-12-19 (×3): qty 2

## 2019-12-19 MED ORDER — AMLODIPINE BESYLATE 5 MG PO TABS
2.5000 mg | ORAL_TABLET | Freq: Every day | ORAL | Status: DC
  Administered 2019-12-20 – 2019-12-22 (×3): 2.5 mg via ORAL
  Filled 2019-12-19 (×4): qty 1

## 2019-12-19 MED ORDER — SIMVASTATIN 20 MG PO TABS
20.0000 mg | ORAL_TABLET | Freq: Every day | ORAL | Status: DC
  Administered 2019-12-19 – 2019-12-22 (×4): 20 mg via ORAL
  Filled 2019-12-19 (×3): qty 1
  Filled 2019-12-19: qty 2

## 2019-12-19 NOTE — ED Notes (Signed)
Portable XR to bedside, Pt tolerated well. PT resting on stretcher rails up x 2 call light in hand. Pt remains AMS as baseline. VSS NAD PT remains on room air and cardiac monitor at this time. Pt IV healthy and dressing is clean, dry, intact.

## 2019-12-19 NOTE — H&P (Signed)
TRH H&P    Patient Demographics:    Matthew Boyd, is a 86 y.o. male  MRN: [id number]  DOB - 10-23-1933  Admit Date - 12/18/2019  Referring MD/NP/PA: Criss AlvineGoldston  Outpatient Primary MD for the patient is Pearson GrippeKim, James, MD  Patient coming from: home  Chief complaint- generalized weakness   HPI:    Matthew Boyd  is a 86 y.o. male, with history of paroxysmal atrial fibrillation, hypertension, diabetes, kidney disease, and more who was discharged from the hospital on 12/17/2019 presents for generalized weakness.  Patient's last admission was for rectal bleeding in the setting of constipation.  There was a question of a stercoral ulcer patient was seen by GI, but there was no further bleeding and hemoglobin was stable so there was no plan for colonoscopy at this time.  Patient was started on Anusol suppository.  According to chart review when patient returned home yesterday he was too weak to get out of the car on his own.  Family actually had to call EMS to get him out of the car, and since then he had just been laying in his bed.  Home health was set up, and when home health went to see him today they said he was too weak for outpatient treatment and needed to go back to the hospital for possible inpatient rehab.  Wife reports that in his current condition of weakness she cannot safely take care of him at home.  Patient has dementia, and is verbal but not with coherent answers at the time of my exam.  This is reported to be at his baseline.  Patient cannot offer any further history at this time.  In the ED Temperature 99.6, heart rate 108-133 A. fib, respiratory rate 21, blood pressure 178/88, 100% on room air The heart rate in the 130s was not sustained. Hematology reviewed a white blood cell count of 12.8, hemoglobin 15.4 CHEM panel reveals a glucose of 207, BUN to creatinine ratio is 24 : 1.77 Covid and flu are  negative Blood cultures pending EKG shows an heart rate of 124, A. fib, QTC of 462 Admission requested for PT consulted and recommendations regarding discharge   Review of systems:    Review of Systems  Unable to perform ROS: Dementia      Past History of the following :    Past Medical History:  Diagnosis Date   Arteriosclerotic cardiovascular disease (ASCVD)    prior MI by echocardiography; stress nuclear -inferior scar; no prior cath   Benign prostatic hypertrophy    s/p transurethral resection of the prostate   Chronic kidney disease    Creatinine of 1.5 in 11/03 and 1.8 in 10/08; 1.75 in 09/2009   Diabetes mellitus type 2, insulin dependent (HCC)    Hyperlipidemia    Hypertension    Nephrolithiasis    surgical stone extractionx2   Paroxysmal atrial fibrillation (HCC)  10/2009   onset in 10/2009; anticoagulation   Tobacco abuse, in remission    25 pack years, discontinued in 1990  Past Surgical History:  Procedure Laterality Date   Surgical removal of renal calculi     3   TRANSURETHRAL RESECTION OF PROSTATE        Social History:      Social History   Tobacco Use   Smoking status: Former Smoker    Packs/day: 1.00    Years: 25.00    Pack years: 25.00    Types: Cigarettes    Start date: 08/29/1955    Quit date: 03/05/1988    Years since quitting: 31.8   Smokeless tobacco: Never Used   Tobacco comment: Smoked 25 years  Substance Use Topics   Alcohol use: Not Currently    Alcohol/week: 0.0 standard drinks    Comment: Excessive use in the 1990s 04/08/14- no longer drinks        Family History :     Family History  Problem Relation Age of Onset   Diabetes Mother    Heart attack Father    Heart disease Sister    Diabetes Sister    Colon cancer Neg Hx       Home Medications:   Prior to Admission medications   Medication Sig Start Date End Date Taking? Authorizing Provider  acetaminophen (TYLENOL) 325 MG tablet Take 325  mg by mouth in the morning and at bedtime.     [provider]  amLODipine (NORVASC) 2.5 MG tablet Take 2.5 mg by mouth daily.    [provider]  apixaban (ELIQUIS) 2.5 MG TABS tablet Take 1 tablet (2.5 mg total) by mouth 2 (two) times daily. 05/30/15   Erick Blinks, MD  donepezil (ARICEPT) 10 MG tablet Take 10 mg by mouth at bedtime.  08/12/18   [provider]  hydrocortisone (ANUSOL-HC) 25 MG suppository Place 1 suppository (25 mg total) rectally 2 (two) times daily. 12/17/19   Erick Blinks, MD  MELATONIN PO Take 5-10 mg by mouth daily as needed (sleep).    [provider]  metoprolol (LOPRESSOR) 50 MG tablet Take 50 mg by mouth 2 (two) times daily.      [provider]  polyethylene glycol (MIRALAX / GLYCOLAX) 17 g packet Take 17 g by mouth 2 (two) times daily. 12/17/19   Erick Blinks, MD  QUEtiapine (SEROQUEL) 25 MG tablet Take 12.5 mg by mouth 2 (two) times daily. 02/04/19   [provider]  simvastatin (ZOCOR) 20 MG tablet Take 20 mg by mouth daily. 01/25/15   [provider]  TRESIBA FLEXTOUCH 100 UNIT/ML SOPN FlexTouch Pen Inject 30 Units into the skin daily.  11/19/17   [provider]     Allergies:    No Known Allergies   Physical Exam:   Vitals  Blood pressure 108/86, pulse 98, temperature 98.4 F (36.9 C), temperature source Oral, resp. rate (!) 21, SpO2 100 %.  1.  General: Patient lying supine in bed no acute distress  2. Psychiatric: Patient is alert, but not oriented And appropriate answers to questions Agitated  3. Neurologic: Cranial nerves II through XII are grossly intact, moving all 4 extremities voluntarily, weakness in the upper and lower extremities bilaterally  4. HEENMT:  Head is atraumatic, normocephalic, pupils reactive to light, neck is supple, trachea is midline, mucous membranes are moist  5. Respiratory : Lungs are clear to auscultation bilaterally  6. Cardiovascular  : Heart rate is tachycardic, rhythm is irregularly irregular, no murmurs rubs or gallops  7. Gastrointestinal:  Abdominal exam inhibited by patient's agitation, but no masses palpated,  abdomen is soft, bowel sounds normal  8. Skin:  No acute lesions on limited skin exam  9.Musculoskeletal:  No acute deformities, no peripheral edema    Data Review:    CBC Recent Labs  Lab 12/15/19 0841 12/15/19 1423 12/15/19 1738 12/15/19 2213 12/16/19 0241 12/16/19 1503 12/18/19 1905  WBC 10.7*  --   --   --  13.0*  --  12.8*  HGB 16.8   < > 16.2 15.8 15.9 15.7 15.4  HCT 50.3   < > 47.4 46.4 47.0 46.9 45.4  PLT 226  --   --   --  215  --  211  MCV 98.8  --   --   --  100.4*  --  97.8  MCH 33.0  --   --   --  34.0  --  33.2  MCHC 33.4  --   --   --  33.8  --  33.9  RDW 13.2  --   --   --  13.6  --  13.3  LYMPHSABS 1.2  --   --   --   --   --  0.9  MONOABS 0.8  --   --   --   --   --  1.1*  EOSABS 0.1  --   --   --   --   --  0.0  BASOSABS 0.0  --   --   --   --   --  0.0   < > = values in this interval not displayed.   ------------------------------------------------------------------------------------------------------------------  Results for orders placed or performed during the hospital encounter of 12/18/19 (from the past 48 hour(s))  Basic metabolic panel     Status: Abnormal   Collection Time: 12/18/19  7:05 PM  Result Value Ref Range   Sodium 137 135 - 145 mmol/L   Potassium 4.2 3.5 - 5.1 mmol/L   Chloride 104 98 - 111 mmol/L   CO2 23 22 - 32 mmol/L   Glucose, Bld 207 (H) 70 - 99 mg/dL    Comment: Glucose reference range applies only to samples taken after fasting for at least 8 hours.   BUN 24 (H) 8 - 23 mg/dL   Creatinine, Ser 4.09 (H) 0.61 - 1.24 mg/dL   Calcium 8.5 (L) 8.9 - 10.3 mg/dL   GFR, Estimated 35 (L) >60 mL/min   Anion gap 10 5 - 15    Comment: Performed at [facility], [address], [place], [place] [address]  CBC with Differential/Platelet     Status:  Abnormal   Collection Time: 12/18/19  7:05 PM  Result Value Ref Range   WBC 12.8 (H) 4.0 - 10.5 K/uL   RBC 4.64 4.22 - 5.81 MIL/uL   Hemoglobin 15.4 13.0 - 17.0 g/dL   HCT 47.8 39 - 52 %   MCV 97.8 80.0 - 100.0 fL   MCH 33.2 26.0 - 34.0 pg   MCHC 33.9 30.0 - 36.0 g/dL   RDW 29.5 62.1 - 30.8 %   Platelets 211 150 - 400 K/uL   nRBC 0.0 0.0 - 0.2 %   Neutrophils Relative % 83 %   Neutro Abs 10.6 (H) 1.7 - 7.7 K/uL   Lymphocytes Relative 7 %   Lymphs Abs 0.9 0.7 - 4.0 K/uL   Monocytes Relative 9 %   Monocytes Absolute 1.1 (H) 0.1 - 1.0 K/uL   Eosinophils Relative 0 %   Eosinophils Absolute 0.0 0.0 - 0.5 K/uL  Basophils Relative 0 %   Basophils Absolute 0.0 0.0 - 0.1 K/uL   Immature Granulocytes 1 %   Abs Immature Granulocytes 0.09 (H) 0.00 - 0.07 K/uL    Comment: Performed at [facility], [address] [place], [place] [address]  Lactic acid, plasma     Status: None   Collection Time: 12/18/19  7:14 PM  Result Value Ref Range   Lactic Acid, Venous 1.8 0.5 - 1.9 mmol/L    Comment: Performed at [facility], [address], [place], [place] [address]  Resp Panel by RT PCR (RSV, Flu A&B, Covid) - Nasopharyngeal Swab     Status: None   Collection Time: 12/18/19  7:14 PM   Specimen: Nasopharyngeal Swab  Result Value Ref Range   SARS Coronavirus 2 by RT PCR NEGATIVE NEGATIVE    Comment: (NOTE) SARS-CoV-2 target nucleic acids are NOT DETECTED.  The SARS-CoV-2 RNA is generally detectable in upper respiratoy specimens during the acute phase of infection. The lowest concentration of SARS-CoV-2 viral copies this assay can detect is 131 copies/mL. A negative result does not preclude SARS-Cov-2 infection and should not be used as the sole basis for treatment or other patient management decisions. A negative result may occur with  improper specimen collection/handling, submission of specimen other than nasopharyngeal swab, presence of viral mutation(s) within the areas targeted by this  assay, and inadequate number of viral copies (<131 copies/mL). A negative result must be combined with clinical observations, patient history, and epidemiological information. The expected result is Negative.  Fact Sheet for Patients:  https://www.moore.com/  Fact Sheet for Healthcare Providers:  https://www.young.biz/  This test is no t yet approved or cleared by the [place] FDA and  has been authorized for detection and/or diagnosis of SARS-CoV-2 by FDA under an Emergency Use Authorization (EUA). This EUA will remain  in effect (meaning this test can be used) for the duration of the COVID-19 declaration under Section 564(b)(1) of the Act, 21 U.S.C. section 360bbb-3(b)(1), unless the authorization is terminated or revoked sooner.     Influenza A by PCR NEGATIVE NEGATIVE   Influenza B by PCR NEGATIVE NEGATIVE    Comment: (NOTE) The Xpert Xpress SARS-CoV-2/FLU/RSV assay is intended as an aid in  the diagnosis of influenza from Nasopharyngeal swab specimens and  should not be used as a sole basis for treatment. Nasal washings and  aspirates are unacceptable for Xpert Xpress SARS-CoV-2/FLU/RSV  testing.  Fact Sheet for Patients: https://www.moore.com/  Fact Sheet for Healthcare Providers: https://www.young.biz/  This test is not yet approved or cleared by the [place] FDA and  has been authorized for detection and/or diagnosis of SARS-CoV-2 by  FDA under an Emergency Use Authorization (EUA). This EUA will remain  in effect (meaning this test can be used) for the duration of the  Covid-19 declaration under Section 564(b)(1) of the Act, 21  U.S.C. section 360bbb-3(b)(1), unless the authorization is  terminated or revoked.    Respiratory Syncytial Virus by PCR NEGATIVE NEGATIVE    Comment: (NOTE) Fact Sheet for Patients: https://www.moore.com/  Fact Sheet for Healthcare  Providers: https://www.young.biz/  This test is not yet approved or cleared by the [place] FDA and  has been authorized for detection and/or diagnosis of SARS-CoV-2 by  FDA under an Emergency Use Authorization (EUA). This EUA will remain  in effect (meaning this test can be used) for the duration of the  COVID-19 declaration under Section 564(b)(1) of the Act, 21 U.S.C.  section 360bbb-3(b)(1), unless  the authorization is terminated or  revoked. Performed at [facility], [address] [place], [place] [address]   Blood culture (routine x 2)     Status: None (Preliminary result)   Collection Time: 12/18/19  7:19 PM   Specimen: BLOOD LEFT FOREARM  Result Value Ref Range   Specimen Description BLOOD LEFT FOREARM    Special Requests      BOTTLES DRAWN AEROBIC AND ANAEROBIC Blood Culture adequate volume Performed at [facility], [address], [place], [place] [address]    Culture PENDING    Report Status PENDING   CBG monitoring, ED     Status: Abnormal   Collection Time: 12/18/19  7:25 PM  Result Value Ref Range   Glucose-Capillary 214 (H) 70 - 99 mg/dL    Comment: Glucose reference range applies only to samples taken after fasting for at least 8 hours.  Blood culture (routine x 2)     Status: None (Preliminary result)   Collection Time: 12/18/19  7:32 PM   Specimen: Left Antecubital; Blood  Result Value Ref Range   Specimen Description LEFT ANTECUBITAL    Special Requests      BOTTLES DRAWN AEROBIC AND ANAEROBIC Blood Culture adequate volume Performed at [facility], [address] [place], [place] [address]    Culture PENDING    Report Status PENDING     Chemistries  Recent Labs  Lab 12/15/19 0841 12/16/19 0241 12/18/19 1905  NA 139 142 137  K 4.2 5.6* 4.2  CL 99 109 104  CO2 26 25 23   GLUCOSE 177* 67* 207*  BUN 28* 25* 24*  CREATININE 1.94* 1.92* 1.77*  CALCIUM 9.2 8.9 8.5*  AST 25  --   --   ALT 17  --   --   ALKPHOS 58  --   --     BILITOT 1.0  --   --    ------------------------------------------------------------------------------------------------------------------  ------------------------------------------------------------------------------------------------------------------ GFR: Estimated Creatinine Clearance: 32.1 mL/min (A) (by C-G formula based on SCr of 1.77 mg/dL (H)). Liver Function Tests: Recent Labs  Lab 12/15/19 0841  AST 25  ALT 17  ALKPHOS 58  BILITOT 1.0  PROT 7.5  ALBUMIN 4.0   No results for input(s): LIPASE, AMYLASE in the last 168 hours. No results for input(s): AMMONIA in the last 168 hours. Coagulation Profile: No results for input(s): INR, PROTIME in the last 168 hours. Cardiac Enzymes: No results for input(s): CKTOTAL, CKMB, CKMBINDEX, TROPONINI in the last 168 hours. BNP (last 3 results) No results for input(s): PROBNP in the last 8760 hours. HbA1C: No results for input(s): HGBA1C in the last 72 hours. CBG: Recent Labs  Lab 12/16/19 1948 12/17/19 0739 12/17/19 1104 12/17/19 1611 12/18/19 1925  GLUCAP 248* 120* 243* 227* 214*   Lipid Profile: No results for input(s): CHOL, HDL, LDLCALC, TRIG, CHOLHDL, LDLDIRECT in the last 72 hours. Thyroid Function Tests: No results for input(s): TSH, T4TOTAL, FREET4, T3FREE, THYROIDAB in the last 72 hours. Anemia Panel: No results for input(s): VITAMINB12, FOLATE, FERRITIN, TIBC, IRON, RETICCTPCT in the last 72 hours.  --------------------------------------------------------------------------------------------------------------- Urine analysis:    Component Value Date/Time   COLORURINE YELLOW 05/24/2015 1815   APPEARANCEUR CLEAR 05/24/2015 1815   LABSPEC 1.015 05/24/2015 1815   PHURINE 5.0 05/24/2015 1815   GLUCOSEU NEGATIVE 05/24/2015 1815   HGBUR NEGATIVE 05/24/2015 1815   BILIRUBINUR SMALL (A) 05/24/2015 1815   KETONESUR NEGATIVE 05/24/2015 1815   PROTEINUR NEGATIVE 05/24/2015 1815   NITRITE NEGATIVE 05/24/2015 1815  LEUKOCYTESUR NEGATIVE 05/24/2015 1815      Imaging Results:    No results found.  My personal review of EKG: Rhythm A. fib, Rate 124/min, QTc 462 ,no Acute ST changes   Assessment & Plan:    Active Problems:   Generalized weakness   1. Generalized weakness 1. Secondary to deconditioning 2. Will likely need inpatient rehab placement 3. Consult PT and OT 4. Continue to monitor 2. GI bleed 1. Continue Anusol 2. GI consult deferred as patient recently saw GI 3. Heme occult positive today, holding Eliquis 4. Monitor hemoglobin 3. Leukocytosis 1. Leukocytosis of 12.8 2. Downtrending from 13 at discharge 3. Blood cultures drawn in the ED 4. UA and chest x-ray for completeness 4. Hyperglycemia in the setting of diabetes mellitus type 2 1. New long-acting insulin 2. Sliding scale coverage 3. Last hemoglobin A1c was 6.6, 3 days ago 4. Continue to monitor 5. Dementia 1. Continue Aricept and Seroquel 2. Required Ativan in ER for agitation 3. We will consider as needed Ativan if patient becomes agitated again    DVT Prophylaxis-   SCDs  AM Labs Ordered, also please review Full Orders  Family Communication: No family at bedside  Code Status: DNR  Admission status: Inpatient :The appropriate admission status for this patient is INPATIENT. Inpatient status is judged to be reasonable and necessary in order to provide the required intensity of service to ensure the patient's safety-including referral to inpatient rehab. The patient's presenting symptoms, physical exam findings, and initial radiographic and laboratory data in the context of their chronic comorbidities is felt to place them at high risk for further clinical deterioration. Furthermore, it is not anticipated that the patient will be medically stable for discharge from the hospital within 2 midnights of admission. The following factors support the admission status of inpatient.     The patient's presenting symptoms  include generalized weakness The initial radiographic and laboratory data are worrisome because of Hemoccult positive The chronic co-morbidities include paroxysmal atrial fibrillation, hypertension, hyponatremia, diabetes mellitus type 2       * I certify that at the point of admission it is my clinical judgment that the patient will require inpatient hospital care spanning beyond 2 midnights from the point of admission due to high intensity of service, high risk for further deterioration and high frequency of surveillance required.*  Time spent in minutes : 64   Lorie Melichar B Zierle-Ghosh DO

## 2019-12-19 NOTE — ED Notes (Signed)
Pt resting in bed. 2 staff assist to change bed linen, external purewick and incontinence brief. Bedside cleaning performed. Pt verbally aggressive towards staff at this time, but Pt did tolerate PO snack and fluids. Pt requires full assist with meal.

## 2019-12-19 NOTE — Progress Notes (Signed)
Patient seen and examined.  Admitted after midnight secondary to physical deconditioning and generalized weakness.  Recent admission secondary to rectal bleeding in the setting of constipation, he was discharged home with home health services at that time.  Patient with underlying history of hypertension, diabetes, chronic kidney disease, dementia and paroxysmal atrial fibrillation.  Since discharge was very deconditioned and not having able to get out of bed.  In his current state wife not capable of providing care of assisting in the home.  Home health staff after visiting in recommended inpatient treatment.  During examination patient hemodynamically stable, confused, talking no much coherent and intermittently following simple commands; per reports no too far out from his baseline.  Please refer to H&P written by Dr. Carren Rang for further info/details on admission.  Plan: -continue supportive care -PT evaluation and will follow rec's -constant reorientation  Vassie Loll MD [phone]

## 2019-12-19 NOTE — ED Notes (Signed)
PT resting on stretcher in lowest position with rails up x 2. Pt continues to be AMS as baseline and physically aggressive with staff. PT VSS NAD PT continues on cardiac monitor and room air at this time.

## 2019-12-20 DIAGNOSIS — I10 Essential (primary) hypertension: Secondary | ICD-10-CM

## 2019-12-20 DIAGNOSIS — F0391 Unspecified dementia with behavioral disturbance: Secondary | ICD-10-CM

## 2019-12-20 DIAGNOSIS — E785 Hyperlipidemia, unspecified: Secondary | ICD-10-CM

## 2019-12-20 DIAGNOSIS — I482 Chronic atrial fibrillation, unspecified: Secondary | ICD-10-CM

## 2019-12-20 DIAGNOSIS — R531 Weakness: Secondary | ICD-10-CM | POA: Diagnosis not present

## 2019-12-20 DIAGNOSIS — E1169 Type 2 diabetes mellitus with other specified complication: Secondary | ICD-10-CM

## 2019-12-20 LAB — GLUCOSE, CAPILLARY
Glucose-Capillary: 90 mg/dL (ref 70–99)
Glucose-Capillary: 109 mg/dL — ABNORMAL HIGH (ref 70–99)
Glucose-Capillary: 88 mg/dL (ref 70–99)
Glucose-Capillary: 251 mg/dL — ABNORMAL HIGH (ref 70–99)

## 2019-12-20 MED ORDER — APIXABAN 2.5 MG PO TABS
2.5000 mg | ORAL_TABLET | Freq: Two times a day (BID) | ORAL | Status: DC
  Administered 2019-12-20 – 2019-12-22 (×6): 2.5 mg via ORAL
  Filled 2019-12-20 (×6): qty 1

## 2019-12-20 MED ORDER — PANTOPRAZOLE SODIUM 40 MG PO TBEC
40.0000 mg | DELAYED_RELEASE_TABLET | Freq: Every day | ORAL | Status: DC
  Administered 2019-12-21 – 2019-12-22 (×2): 40 mg via ORAL
  Filled 2019-12-20 (×2): qty 1

## 2019-12-20 MED ORDER — QUETIAPINE FUMARATE 25 MG PO TABS
25.0000 mg | ORAL_TABLET | Freq: Two times a day (BID) | ORAL | Status: DC
  Administered 2019-12-20 – 2019-12-22 (×5): 25 mg via ORAL
  Filled 2019-12-20 (×5): qty 1

## 2019-12-20 NOTE — Progress Notes (Signed)
PROGRESS NOTE    Matthew Boyd  PVV:[id number] DOB: [birth year] DOA: 12/18/2019 PCP: [name], MD   Chief Complaint  Patient presents with  . Weakness    Brief Narrative:  As per H&P written by Dr. Carren Rang on 12/19/19 Matthew Boyd  is a 86 y.o. male, with history of paroxysmal atrial fibrillation, hypertension, diabetes, kidney disease, and more who was discharged from the hospital on 12/17/2019 presents for generalized weakness.  Patient's last admission was for rectal bleeding in the setting of constipation.  There was a question of a stercoral ulcer patient was seen by GI, but there was no further bleeding and hemoglobin was stable so there was no plan for colonoscopy at this time.  Patient was started on Anusol suppository.  According to chart review when patient returned home yesterday he was too weak to get out of the car on his own.  Family actually had to call EMS to get him out of the car, and since then he had just been laying in his bed.  Home health was set up, and when home health went to see him today they said he was too weak for outpatient treatment and needed to go back to the hospital for possible inpatient rehab.  Wife reports that in his current condition of weakness she cannot safely take care of him at home.  Patient has dementia, and is verbal but not with coherent answers at the time of my exam.  This is reported to be at his baseline.  Patient cannot offer any further history at this time.  In the ED Temperature 99.6, heart rate 108-133 A. fib, respiratory rate 21, blood pressure 178/88, 100% on room air The heart rate in the 130s was not sustained. Hematology reviewed a white blood cell count of 12.8, hemoglobin 15.4 CHEM panel reveals a glucose of 207, BUN to creatinine ratio is 24 : 1.77 Covid and flu are negative Blood cultures pending EKG shows an heart rate of 124, A. fib, QTC of 462 Admission requested for PT consulted and recommendations regarding  discharge  Assessment & Plan: 1-generalized weakness and physical deconditioning -Pending evaluation by physical therapy -Will not be surprised of recommendations for patient to go to a skilled nursing facility for further care and rehab -continue supportive care and assistance  2-paroxysmal atrial fibrillation -Continue metoprolol and continue Eliquis  3-dementia with behavioral disturbances -Continue the use of Aricept and Seroquel -Constant reorientation and assistance to be provided.  4-recent history of GI bleed (rectal bleeding) -No overt bleeding appreciated -Hemoglobin level stable -Continue the use of MiraLAX as needed -Plan is for outpatient follow-up with gastroenterology service -Continue the use of Anusol  5-type 2 diabetes -Continue sliding scale insulin -Follow CBGs  6-hypertension -Continue amlodipine and metoprolol -Vital signs stable.  7-gastroesophageal reflux disease/hyperlipidemia -Will continue the use of a statins -Continue PPI.   DVT prophylaxis: on eliquis.  Code Status: DNR. Family Communication: no family at bedside; wife unable to be reached over the phone.  Disposition:   Status is: Inpatient  Dispo: The patient is from: home               Anticipated d/c is to: SNF              Anticipated d/c date is: to be determined.               Patient currently medically stable for discharge to a SNF for rehabilitation and further care. No CP, no nausea, no  vomiting. Intermittent episodes of agitation and ongoing confusion reported by nursing staff.     Consultants:   None    Procedures:  See below for x-ray reports.    Antimicrobials:  None    Subjective: Somnolent this morning, no CP, no nausea, no vomiting. Pleasantly confused at this time. Demonstrating generalized weakness and deconditioning.   Objective: Vitals:   12/19/19 2014 12/19/19 2341 12/19/19 2350 12/20/19 0400  BP:  137/69  110/67  Pulse: 97 89  91  Resp:        Temp:    (!) 97.1 F (36.2 C)  TempSrc:    Axillary  SpO2: 98% 100%  98%  Weight:   86.5 kg   Height:   5\' 10"  (1.778 m)    No intake or output data in the 24 hours ending 12/20/19 1137 Filed Weights   12/19/19 2350  Weight: 86.5 kg    Examination: General exam: somnolent this morning, not following commands, no nausea, no vomiting, no complaints of CP or SOB. Respiratory system: Clear to auscultation. Respiratory effort normal. Cardiovascular system: S1 & S2 heard, regular rate, no rubs or gallops.  Gastrointestinal system: Abdomen is nondistended, soft and nontender. No organomegaly or masses felt. Normal bowel sounds heard. Central nervous system: No focal neurological deficits. Extremities: no cyanosis or clubbing.  Skin: No petechiae, dry skin appreciated. Psychiatry: Judgement and insight appear impaired due to underlying dementia.   Data Reviewed: I have personally reviewed following labs and imaging studies  CBC: Recent Labs  Lab 12/15/19 0841 12/15/19 1423 12/16/19 0241 12/16/19 0241 12/16/19 1503 12/18/19 1905 12/19/19 0053 12/19/19 1351 12/19/19 2225  WBC 10.7*  --  13.0*  --   --  12.8* 11.6* 12.1* 13.1*  NEUTROABS 8.5*  --   --   --   --  10.6*  --   --   --   HGB 16.8   < > 15.9   < > 15.7 15.4 14.1 15.7 14.8  HCT 50.3   < > 47.0   < > 46.9 45.4 42.4 47.8 45.4  MCV 98.8  --  100.4*  --   --  97.8 101.0* 101.1* 100.9*  PLT 226  --  215  --   --  211 198 234 254   < > = values in this interval not displayed.    Basic Metabolic Panel: Recent Labs  Lab 12/15/19 0841 12/16/19 0241 12/18/19 1905 12/19/19 0201  NA 139 142 137 141  K 4.2 5.6* 4.2 4.6  CL 99 109 104 105  CO2 26 25 23 22   GLUCOSE 177* 67* 207* 180*  BUN 28* 25* 24* 24*  CREATININE 1.94* 1.92* 1.77* 1.69*  CALCIUM 9.2 8.9 8.5* 8.6*  MG  --   --   --  1.9    GFR: Estimated Creatinine Clearance: 33.6 mL/min (A) (by C-G formula based on SCr of 1.69 mg/dL (H)).  Liver Function  Tests: Recent Labs  Lab 12/15/19 0841 12/19/19 0201  AST 25 25  ALT 17 15  ALKPHOS 58 59  BILITOT 1.0 1.5*  PROT 7.5 6.5  ALBUMIN 4.0 3.1*    CBG: Recent Labs  Lab 12/19/19 1150 12/19/19 1803 12/19/19 2127 12/20/19 0737 12/20/19 1131  GLUCAP 141* 182* 173* 90 109*     Recent Results (from the past 240 hour(s))  Respiratory Panel by RT PCR (Flu A&B, Covid) - Nasopharyngeal Swab     Status: None   Collection Time: 12/15/19  9:45 AM  Specimen: Nasopharyngeal Swab  Result Value Ref Range Status   SARS Coronavirus 2 by RT PCR NEGATIVE NEGATIVE Final    Comment: (NOTE) SARS-CoV-2 target nucleic acids are NOT DETECTED.  The SARS-CoV-2 RNA is generally detectable in upper respiratoy specimens during the acute phase of infection. The lowest concentration of SARS-CoV-2 viral copies this assay can detect is 131 copies/mL. A negative result does not preclude SARS-Cov-2 infection and should not be used as the sole basis for treatment or other patient management decisions. A negative result may occur with  improper specimen collection/handling, submission of specimen other than nasopharyngeal swab, presence of viral mutation(s) within the areas targeted by this assay, and inadequate number of viral copies (<131 copies/mL). A negative result must be combined with clinical observations, patient history, and epidemiological information. The expected result is Negative.  Fact Sheet for Patients:  https://www.moore.com/  Fact Sheet for Healthcare Providers:  https://www.young.biz/  This test is no t yet approved or cleared by the [place] FDA and  has been authorized for detection and/or diagnosis of SARS-CoV-2 by FDA under an Emergency Use Authorization (EUA). This EUA will remain  in effect (meaning this test can be used) for the duration of the COVID-19 declaration under Section 564(b)(1) of the Act, 21 U.S.C. section  360bbb-3(b)(1), unless the authorization is terminated or revoked sooner.     Influenza A by PCR NEGATIVE NEGATIVE Final   Influenza B by PCR NEGATIVE NEGATIVE Final    Comment: (NOTE) The Xpert Xpress SARS-CoV-2/FLU/RSV assay is intended as an aid in  the diagnosis of influenza from Nasopharyngeal swab specimens and  should not be used as a sole basis for treatment. Nasal washings and  aspirates are unacceptable for Xpert Xpress SARS-CoV-2/FLU/RSV  testing.  Fact Sheet for Patients: https://www.moore.com/  Fact Sheet for Healthcare Providers: https://www.young.biz/  This test is not yet approved or cleared by the [place] FDA and  has been authorized for detection and/or diagnosis of SARS-CoV-2 by  FDA under an Emergency Use Authorization (EUA). This EUA will remain  in effect (meaning this test can be used) for the duration of the  Covid-19 declaration under Section 564(b)(1) of the Act, 21  U.S.C. section 360bbb-3(b)(1), unless the authorization is  terminated or revoked. Performed at [facility], [address] [place], [place] [address]   Resp Panel by RT PCR (RSV, Flu A&B, Covid) - Nasopharyngeal Swab     Status: None   Collection Time: 12/18/19  7:14 PM   Specimen: Nasopharyngeal Swab  Result Value Ref Range Status   SARS Coronavirus 2 by RT PCR NEGATIVE NEGATIVE Final    Comment: (NOTE) SARS-CoV-2 target nucleic acids are NOT DETECTED.  The SARS-CoV-2 RNA is generally detectable in upper respiratoy specimens during the acute phase of infection. The lowest concentration of SARS-CoV-2 viral copies this assay can detect is 131 copies/mL. A negative result does not preclude SARS-Cov-2 infection and should not be used as the sole basis for treatment or other patient management decisions. A negative result may occur with  improper specimen collection/handling, submission of specimen other than nasopharyngeal swab, presence of  viral mutation(s) within the areas targeted by this assay, and inadequate number of viral copies (<131 copies/mL). A negative result must be combined with clinical observations, patient history, and epidemiological information. The expected result is Negative.  Fact Sheet for Patients:  https://www.moore.com/  Fact Sheet for Healthcare Providers:  https://www.young.biz/  This test is no t yet approved or cleared by the [place]  FDA and  has been authorized for detection and/or diagnosis of SARS-CoV-2 by FDA under an Emergency Use Authorization (EUA). This EUA will remain  in effect (meaning this test can be used) for the duration of the COVID-19 declaration under Section 564(b)(1) of the Act, 21 U.S.C. section 360bbb-3(b)(1), unless the authorization is terminated or revoked sooner.     Influenza A by PCR NEGATIVE NEGATIVE Final   Influenza B by PCR NEGATIVE NEGATIVE Final    Comment: (NOTE) The Xpert Xpress SARS-CoV-2/FLU/RSV assay is intended as an aid in  the diagnosis of influenza from Nasopharyngeal swab specimens and  should not be used as a sole basis for treatment. Nasal washings and  aspirates are unacceptable for Xpert Xpress SARS-CoV-2/FLU/RSV  testing.  Fact Sheet for Patients: https://www.moore.com/  Fact Sheet for Healthcare Providers: https://www.young.biz/  This test is not yet approved or cleared by the [place] FDA and  has been authorized for detection and/or diagnosis of SARS-CoV-2 by  FDA under an Emergency Use Authorization (EUA). This EUA will remain  in effect (meaning this test can be used) for the duration of the  Covid-19 declaration under Section 564(b)(1) of the Act, 21  U.S.C. section 360bbb-3(b)(1), unless the authorization is  terminated or revoked.    Respiratory Syncytial Virus by PCR NEGATIVE NEGATIVE Final    Comment: (NOTE) Fact Sheet for  Patients: https://www.moore.com/  Fact Sheet for Healthcare Providers: https://www.young.biz/  This test is not yet approved or cleared by the [place] FDA and  has been authorized for detection and/or diagnosis of SARS-CoV-2 by  FDA under an Emergency Use Authorization (EUA). This EUA will remain  in effect (meaning this test can be used) for the duration of the  COVID-19 declaration under Section 564(b)(1) of the Act, 21 U.S.C.  section 360bbb-3(b)(1), unless the authorization is terminated or  revoked. Performed at [facility], [address] [place], [place] [address]   Blood culture (routine x 2)     Status: None (Preliminary result)   Collection Time: 12/18/19  7:19 PM   Specimen: BLOOD LEFT FOREARM  Result Value Ref Range Status   Specimen Description BLOOD LEFT FOREARM  Final   Special Requests   Final    BOTTLES DRAWN AEROBIC AND ANAEROBIC Blood Culture adequate volume   Culture   Final    NO GROWTH < 12 HOURS Performed at [facility], [address] [place], [place] [address]    Report Status PENDING  Incomplete  Blood culture (routine x 2)     Status: None (Preliminary result)   Collection Time: 12/18/19  7:32 PM   Specimen: Left Antecubital; Blood  Result Value Ref Range Status   Specimen Description LEFT ANTECUBITAL  Final   Special Requests   Final    BOTTLES DRAWN AEROBIC AND ANAEROBIC Blood Culture adequate volume   Culture   Final    NO GROWTH < 12 HOURS Performed at [facility], [address], [place], [place] [address]    Report Status PENDING  Incomplete     Radiology Studies: DG CHEST PORT 1 VIEW  Result Date: 12/19/2019 CLINICAL DATA:  Recent admission for rectal bleeding/ulcer, weakness EXAM: PORTABLE CHEST 1 VIEW COMPARISON:  11/24/2015 FINDINGS: Lungs are clear.  No pleural effusion or pneumothorax. The heart is normal in size. IMPRESSION: No evidence of acute cardiopulmonary disease. Electronically  Signed   By: Charline Bills M.D.   On: 12/19/2019 05:16    Scheduled Meds: . amLODipine  2.5 mg Oral  Daily  . donepezil  10 mg Oral QHS  . heparin  5,000 Units Subcutaneous Q8H  . hydrocortisone  25 mg Rectal BID  . insulin aspart  0-15 Units Subcutaneous TID WC  . insulin aspart  0-5 Units Subcutaneous QHS  . insulin detemir  25 Units Subcutaneous QHS  . metoprolol tartrate  50 mg Oral BID  . pantoprazole (PROTONIX) IV  40 mg Intravenous Q12H  . polyethylene glycol  17 g Oral BID  . QUEtiapine  25 mg Oral BID  . simvastatin  20 mg Oral Daily   Continuous Infusions:   LOS: 2 days    Time spent: 30 minutes    Vassie Loll, MD [facility]   To contact the attending provider between 7A-7P or the covering provider during after hours 7P-7A, please log into the web site [email or web address] and access using universal [place] password for that web site. If you do not have the password, please call the hospital operator.  12/20/2019, 11:37 AM

## 2019-12-21 DIAGNOSIS — R531 Weakness: Secondary | ICD-10-CM | POA: Diagnosis not present

## 2019-12-21 DIAGNOSIS — I482 Chronic atrial fibrillation, unspecified: Secondary | ICD-10-CM | POA: Diagnosis not present

## 2019-12-21 DIAGNOSIS — I10 Essential (primary) hypertension: Secondary | ICD-10-CM | POA: Diagnosis not present

## 2019-12-21 DIAGNOSIS — F0391 Unspecified dementia with behavioral disturbance: Secondary | ICD-10-CM | POA: Diagnosis not present

## 2019-12-21 LAB — GLUCOSE, CAPILLARY
Glucose-Capillary: 109 mg/dL — ABNORMAL HIGH (ref 70–99)
Glucose-Capillary: 149 mg/dL — ABNORMAL HIGH (ref 70–99)
Glucose-Capillary: 221 mg/dL — ABNORMAL HIGH (ref 70–99)
Glucose-Capillary: 314 mg/dL — ABNORMAL HIGH (ref 70–99)

## 2019-12-21 MED ORDER — COLCHICINE 0.6 MG PO TABS
1.2000 mg | ORAL_TABLET | Freq: Every day | ORAL | Status: AC
  Administered 2019-12-21: 1.2 mg via ORAL
  Filled 2019-12-21: qty 2

## 2019-12-21 MED ORDER — COLCHICINE 0.6 MG PO TABS
0.6000 mg | ORAL_TABLET | Freq: Every day | ORAL | Status: AC
  Administered 2019-12-21: 0.6 mg via ORAL
  Filled 2019-12-21: qty 1

## 2019-12-21 NOTE — Evaluation (Signed)
Physical Therapy Evaluation Patient Details Name: Matthew Boyd MRN: [id number] DOB: 02-Jul-1933 Today's Date: 12/21/2019   History of Present Illness  Matthew Boyd  is a 86 y.o. male, with history of paroxysmal atrial fibrillation, hypertension, diabetes, kidney disease, and more who was discharged from the hospital on 12/17/2019 presents for generalized weakness.  Patient's last admission was for rectal bleeding in the setting of constipation.  There was a question of a stercoral ulcer patient was seen by GI, but there was no further bleeding and hemoglobin was stable so there was no plan for colonoscopy at this time.  Patient was started on Anusol suppository.  According to chart review when patient returned home yesterday he was too weak to get out of the car on his own.  Family actually had to call EMS to get him out of the car, and since then he had just been laying in his bed.  Home health was set up, and when home health went to see him today they said he was too weak for outpatient treatment and needed to go back to the hospital for possible inpatient rehab.  Wife reports that in his current condition of weakness she cannot safely take care of him at home.  Patient has dementia, and is verbal but not with coherent answers at the time of my exam.  This is reported to be at his baseline.  Patient cannot offer any further history at this time.    Clinical Impression  Patient c/o severe pain with weight bearing on right hand, Max assist for sitting up at bedside and unable to stand due to fatigue, weakness and apprehension.  Patient put back to bed with Max/total assistance to reposition - patient's spouse present in room.  Patient will benefit from continued physical therapy in hospital and recommended venue below to increase strength, balance, endurance for safe ADLs and gait.    Follow Up Recommendations SNF    Equipment Recommendations  None recommended by PT    Recommendations for  Other Services       Precautions / Restrictions Precautions Precautions: Fall Restrictions Weight Bearing Restrictions: No      Mobility  Bed Mobility Overal bed mobility: Needs Assistance Bed Mobility: Supine to Sit;Sit to Supine     Supine to sit: Max assist Sit to supine: Max assist   General bed mobility comments: poor tolerance for weighbearing on right hand due to c/o increased pain  Transfers                    Ambulation/Gait                Stairs            Wheelchair Mobility    Modified Rankin (Stroke Patients Only)       Balance Overall balance assessment: Needs assistance Sitting-balance support: Feet supported;No upper extremity supported Sitting balance-Leahy Scale: Fair Sitting balance - Comments: fair/good seated at EOB                                     Pertinent Vitals/Pain Pain Assessment: Faces Faces Pain Scale: Hurts even more Pain Location: right hand, any pressure to BLE Pain Descriptors / Indicators: Aching;Sore;Grimacing;Guarding Pain Intervention(s): Limited activity within patient's tolerance;Monitored during session;Repositioned    Home Living Family/patient expects to be discharged to:: Private residence Living Arrangements: Spouse/significant other Available Help at Discharge: Family Type  of Home: Mobile home Home Access: Stairs to enter Entrance Stairs-Rails: Right;Left;Can reach both Entrance Stairs-Number of Steps: 3 Home Layout: One level Home Equipment: Walker - 2 wheels;Cane - single point;Bedside commode;Shower seat      Prior Function Level of Independence: Needs assistance      ADL's / Homemaking Assistance Needed: assisted by spouse        Hand Dominance        Extremity/Trunk Assessment   Upper Extremity Assessment Upper Extremity Assessment: Generalized weakness    Lower Extremity Assessment Lower Extremity Assessment: Generalized weakness    Cervical /  Trunk Assessment Cervical / Trunk Assessment: Normal  Communication   Communication: Other (comment) (hard to communitcate due to confusion)  Cognition Arousal/Alertness: Awake/alert Behavior During Therapy: Anxious Overall Cognitive Status: History of cognitive impairments - at baseline                                 General Comments: requires repeated verbal/tactile cueing to follow instructions      General Comments      Exercises     Assessment/Plan    PT Assessment Patient needs continued PT services  PT Problem List Decreased strength;Decreased activity tolerance;Decreased balance;Decreased mobility;Decreased safety awareness;Decreased knowledge of use of DME       PT Treatment Interventions Balance training;Gait training;Stair training;Functional mobility training;Therapeutic activities;Therapeutic exercise;Patient/family education;DME instruction    PT Goals (Current goals can be found in the Care Plan section)  Acute Rehab PT Goals Patient Stated Goal: return home with family to assist PT Goal Formulation: With patient/family Time For Goal Achievement: 01/04/20 Potential to Achieve Goals: Good    Frequency Min 3X/week   Barriers to discharge        Co-evaluation               AM-PAC PT "6 Clicks" Mobility  Outcome Measure Help needed turning from your back to your side while in a flat bed without using bedrails?: A Lot Help needed moving from lying on your back to sitting on the side of a flat bed without using bedrails?: A Lot Help needed moving to and from a bed to a chair (including a wheelchair)?: Total Help needed standing up from a chair using your arms (e.g., wheelchair or bedside chair)?: Total Help needed to walk in hospital room?: Total Help needed climbing 3-5 steps with a railing? : Total 6 Click Score: 8    End of Session   Activity Tolerance: Patient limited by fatigue;Patient limited by pain Patient left: in bed;with  call bell/phone within reach;with family/visitor present Nurse Communication: Mobility status PT Visit Diagnosis: Unsteadiness on feet (R26.81);Other abnormalities of gait and mobility (R26.89);Muscle weakness (generalized) (M62.81)    Time: 0932-3557 PT Time Calculation (min) (ACUTE ONLY): 32 min   Charges:   PT Evaluation $PT Eval Moderate Complexity: 1 Mod PT Treatments $Therapeutic Activity: 23-37 mins        3:01 PM, 12/21/19 Ocie Bob, MPT Physical Therapist with [facility] [phone] office [phone] mobile phone

## 2019-12-21 NOTE — Progress Notes (Signed)
RN reported red and painful right elbow and right middle finger.  Wife reports hx of gout but no home meds listed.  Gave 1.2mg  followed by 0.6 mg overnight for initial flare treatment.

## 2019-12-21 NOTE — TOC Initial Note (Signed)
Transition of Care [facility]) - Initial/Assessment Note   Patient Details  Name: Matthew Boyd MRN: [id number] Date of Birth: [birth year]  Transition of Care [facility]) CM/SW Contact:    Ewing Schlein, LCSW Phone Number: 12/21/2019, 11:58 AM  Clinical Narrative: Patient is an 86 year old male who was admitted for generalized weakness. Readmission checklist completed due to high readmission score. CSW completed assessment with patient's wife, Kensington Duerst, as patient has dementia. Per wife, when the patient discharged last week, she required EMS assistance to get the patient into the mobile home. Wife reported even with family assistance to get the patient out of the bed, they were having difficulty getting him to the bathroom and back into bed. Wife stated HH came out to the home on 12/18/19 and told her "they couldn't help him. He needed more help," so wife called EMS. Wife reported she spoke with PT and SNF is recommended. Wife agreeable to SNF referrals.  FL2 completed; patient has existing PASRR #. CSW faxed out initial referral. CSW called [facility] and spoke with [place] to start insurance authorization. [employer] reference ID # is: [id number]. Clinicals faxed to [facility]. TOC to follow.  Expected Discharge Plan: Skilled Nursing Facility Barriers to Discharge: Continued Medical Work up  Patient Goals and CMS Choice Patient states their goals for this hospitalization and ongoing recovery are:: Discharge to rehab CMS Medicare.gov Compare Post Acute Care list provided to:: Patient Represenative (must comment) [name] (wife)) Choice offered to / list presented to : Spouse  Expected Discharge Plan and Services Expected Discharge Plan: Skilled Nursing Facility In-house Referral: Clinical Social Work Discharge Planning Services: NA Post Acute Care Choice: Skilled Nursing Facility Living arrangements for the past 2 months: Mobile Home  Prior Living Arrangements/Services Living arrangements  for the past 2 months: Mobile Home Lives with:: Spouse Patient language and need for interpreter reviewed:: Yes Do you feel safe going back to the place where you live?: Yes      Need for Family Participation in Patient Care: Yes (Comment) (Patient has dementia) Care giver support system in place?: Yes (comment) Current home services: DME (Rolling walker, cane) Criminal Activity/Legal Involvement Pertinent to Current Situation/Hospitalization: No - Comment as needed  Activities of Daily Living Home Assistive Devices/Equipment: CBG Meter ADL Screening (condition at time of admission) Patient's cognitive ability adequate to safely complete daily activities?: No Is the patient deaf or have difficulty hearing?: No Does the patient have difficulty seeing, even when wearing glasses/contacts?: No Does the patient have difficulty concentrating, remembering, or making decisions?: Yes Patient able to express need for assistance with ADLs?: No Does the patient have difficulty dressing or bathing?: Yes Independently performs ADLs?: No Communication: Independent Dressing (OT): Needs assistance Is this a change from baseline?: Pre-admission baseline Grooming: Needs assistance Is this a change from baseline?: Pre-admission baseline Feeding: Needs assistance Is this a change from baseline?: Pre-admission baseline Bathing: Needs assistance Is this a change from baseline?: Pre-admission baseline Toileting: Needs assistance Is this a change from baseline?: Pre-admission baseline In/Out Bed: Needs assistance Is this a change from baseline?: Pre-admission baseline Walks in Home: Needs assistance Is this a change from baseline?: Pre-admission baseline Does the patient have difficulty walking or climbing stairs?: Yes Weakness of Legs: Both Weakness of Arms/Hands: None  Permission Sought/Granted Permission sought to share information with : Facility Industrial/product designer granted to share  info w AGENCY: SNFs  Emotional Assessment Appearance:: Appears stated age Orientation: : Oriented to Self Alcohol / Substance Use: Not  Applicable Psych Involvement: No (comment)  Admission diagnosis:  Leukocytosis [D72.829] Occult blood in stools [R19.5] Generalized weakness [R53.1] Patient Active Problem List   Diagnosis Date Noted  . Generalized weakness 12/18/2019  . GI bleed 12/16/2019  . Acute GI bleeding 12/15/2019  . Rectal bleeding   . Constipation   . Finger pain 06/11/2015  . Acute gout 05/30/2015  . Protein-calorie malnutrition, severe 05/25/2015  . Supratherapeutic INR 05/24/2015  . Acute renal failure superimposed on stage 3 chronic kidney disease (HCC) 05/24/2015  . Atrial fibrillation (HCC) 05/24/2015  . Degenerative joint disease 02/28/2012  . Chronic anticoagulation 02/08/2011  . Arteriosclerotic cardiovascular disease (ASCVD)   . Hypertension   . Hyperlipidemia   . Diabetes mellitus type 2, insulin dependent (HCC)   . Nephrolithiasis   . Chronic kidney disease   . Tobacco abuse, in remission   . Paroxysmal atrial fibrillation (HCC) 10/03/2009   PCP:  [name], MD Pharmacy:   [facility] [place], [place] - [phone] [place] [address] Phone: [phone] Fax: [phone]  Readmission Risk Interventions Readmission Risk Prevention Plan 12/21/2019  Transportation Screening Complete  PCP or Specialist Appt within 3-5 Days Not Complete  Not Complete comments SNF is the anticipated discharge plan  HRI or Home Care Consult Complete  Social Work Consult for Recovery Care Planning/Counseling Complete  Palliative Care Screening Not Applicable  Medication Review Oceanographer) Complete  Some recent data might be hidden

## 2019-12-21 NOTE — Plan of Care (Signed)
  Problem: Acute Rehab PT Goals(only PT should resolve) Goal: Pt Will Go Supine/Side To Sit Outcome: Progressing Flowsheets (Taken 12/21/2019 1502) Pt will go Supine/Side to Sit:  with minimal assist  with moderate assist Goal: Patient Will Transfer Sit To/From Stand Outcome: Progressing Flowsheets (Taken 12/21/2019 1502) Patient will transfer sit to/from stand:  with minimal assist  with moderate assist Goal: Pt Will Transfer Bed To Chair/Chair To Bed Outcome: Progressing Flowsheets (Taken 12/21/2019 1502) Pt will Transfer Bed to Chair/Chair to Bed: with mod assist Goal: Pt Will Ambulate Outcome: Progressing Flowsheets (Taken 12/21/2019 1502) Pt will Ambulate:  25 feet  with minimal assist  with moderate assist  with rolling walker   3:03 PM, 12/21/19 Ocie Bob, MPT Physical Therapist with [facility] [phone] office [phone] mobile phone

## 2019-12-21 NOTE — [place] FL2 (Signed)
MEDICAID FL2 LEVEL OF CARE SCREENING TOOL     IDENTIFICATION  Patient Name: Matthew Boyd Birthdate: 1933/04/23 Sex: male Admission Date (Current Location): 12/18/2019  [facility] and [place] Number:  [employer] and Address:  [facility],  [address], [place] [address]      Provider Number: [id number]  Attending Physician Name and Address:  Vassie Loll, MD  Relative Name and Phone Number:  Taiyo Kozma (wife) Ph: [phone]    Current Level of Care: Hospital Recommended Level of Care: Skilled Nursing Facility Prior Approval Number:    Date Approved/Denied:   PASRR Number: [id number]  Discharge Plan: SNF    Current Diagnoses: Patient Active Problem List   Diagnosis Date Noted  . Generalized weakness 12/18/2019  . GI bleed 12/16/2019  . Acute GI bleeding 12/15/2019  . Rectal bleeding   . Constipation   . Finger pain 06/11/2015  . Acute gout 05/30/2015  . Protein-calorie malnutrition, severe 05/25/2015  . Supratherapeutic INR 05/24/2015  . Acute renal failure superimposed on stage 3 chronic kidney disease (HCC) 05/24/2015  . Atrial fibrillation (HCC) 05/24/2015  . Degenerative joint disease 02/28/2012  . Chronic anticoagulation 02/08/2011  . Arteriosclerotic cardiovascular disease (ASCVD)   . Hypertension   . Hyperlipidemia   . Diabetes mellitus type 2, insulin dependent (HCC)   . Nephrolithiasis   . Chronic kidney disease   . Tobacco abuse, in remission   . Paroxysmal atrial fibrillation (HCC) 10/03/2009    Orientation RESPIRATION BLADDER Height & Weight     Self  Normal Incontinent Weight: 190 lb 11.2 oz (86.5 kg) Height:  5\' 10"  (177.8 cm)  BEHAVIORAL SYMPTOMS/MOOD NEUROLOGICAL BOWEL NUTRITION STATUS      Incontinent Diet (Carb modified)  AMBULATORY STATUS COMMUNICATION OF NEEDS Skin   Extensive Assist Verbally Other (Comment) (Ecchymosis and cracking: right arm, left leg)                        Personal Care Assistance Level of Assistance  Bathing, Feeding, Dressing Bathing Assistance: Maximum assistance Feeding assistance: Independent Dressing Assistance: Maximum assistance     Functional Limitations Info  Sight, Hearing, Speech Sight Info: Adequate Hearing Info: Adequate Speech Info: Adequate    SPECIAL CARE FACTORS FREQUENCY  PT (By licensed PT)     PT Frequency: 5x's/week              Contractures Contractures Info: Not present    Additional Factors Info  Code Status, Allergies, Psychotropic, Insulin Sliding Scale Code Status Info: DNR Allergies Info: NKA Psychotropic Info: Seroquel (quetiapine) Insulin Sliding Scale Info: See discharge summary       Current Medications (12/21/2019):  This is the current hospital active medication list Current Facility-Administered Medications  Medication Dose Route Frequency Provider Last Rate Last Admin  . acetaminophen (TYLENOL) tablet 650 mg  650 mg Oral Q6H PRN Zierle-Ghosh, Asia B, DO   650 mg at 12/21/19 12/23/19   Or  . acetaminophen (TYLENOL) suppository 650 mg  650 mg Rectal Q6H PRN Zierle-Ghosh, Asia B, DO      . amLODipine (NORVASC) tablet 2.5 mg  2.5 mg Oral Daily Zierle-Ghosh, Asia B, DO   2.5 mg at 12/21/19 0842  . apixaban (ELIQUIS) tablet 2.5 mg  2.5 mg Oral BID 12/23/19, MD   2.5 mg at 12/21/19 0842  . donepezil (ARICEPT) tablet 10 mg  10 mg Oral QHS Zierle-Ghosh, Asia B, DO   10 mg at 12/20/19  2146  . hydrocortisone (ANUSOL-HC) suppository 25 mg  25 mg Rectal BID Zierle-Ghosh, Asia B, DO   25 mg at 12/21/19 0841  . insulin aspart (novoLOG) injection 0-15 Units  0-15 Units Subcutaneous TID WC Zierle-Ghosh, Asia B, DO   2 Units at 12/21/19 1215  . insulin aspart (novoLOG) injection 0-5 Units  0-5 Units Subcutaneous QHS Zierle-Ghosh, Asia B, DO   3 Units at 12/20/19 2146  . insulin detemir (LEVEMIR) injection 25 Units  25 Units Subcutaneous QHS Zierle-Ghosh, Asia B, DO   25 Units at 12/20/19 2147  .  metoprolol tartrate (LOPRESSOR) tablet 50 mg  50 mg Oral BID Zierle-Ghosh, Asia B, DO   50 mg at 12/21/19 0843  . ondansetron (ZOFRAN) tablet 4 mg  4 mg Oral Q6H PRN Zierle-Ghosh, Asia B, DO       Or  . ondansetron (ZOFRAN) injection 4 mg  4 mg Intravenous Q6H PRN Zierle-Ghosh, Asia B, DO      . pantoprazole (PROTONIX) EC tablet 40 mg  40 mg Oral Daily Vassie Loll, MD   40 mg at 12/21/19 0843  . polyethylene glycol (MIRALAX / GLYCOLAX) packet 17 g  17 g Oral BID Zierle-Ghosh, Asia B, DO   17 g at 12/21/19 0841  . QUEtiapine (SEROQUEL) tablet 25 mg  25 mg Oral BID Vassie Loll, MD   25 mg at 12/21/19 0842  . simvastatin (ZOCOR) tablet 20 mg  20 mg Oral Daily Zierle-Ghosh, Asia B, DO   20 mg at 12/21/19 8657     Discharge Medications: Please see discharge summary for a list of discharge medications.  Relevant Imaging Results:  Relevant Lab Results:   Additional Information SSN: [id number]  Ewing Schlein, LCSW

## 2019-12-21 NOTE — Progress Notes (Signed)
PROGRESS NOTE    Matthew Boyd  WEX:[id number] DOB: Aug 17, 1933 DOA: 12/18/2019 PCP: [name], MD   Chief Complaint  Patient presents with  . Weakness    Brief Narrative:  As per H&P written by Dr. Carren Rang on 12/19/19 Matthew Boyd  is a 86 y.o. male, with history of paroxysmal atrial fibrillation, hypertension, diabetes, kidney disease, and more who was discharged from the hospital on 12/17/2019 presents for generalized weakness.  Patient's last admission was for rectal bleeding in the setting of constipation.  There was a question of a stercoral ulcer patient was seen by GI, but there was no further bleeding and hemoglobin was stable so there was no plan for colonoscopy at this time.  Patient was started on Anusol suppository.  According to chart review when patient returned home yesterday he was too weak to get out of the car on his own.  Family actually had to call EMS to get him out of the car, and since then he had just been laying in his bed.  Home health was set up, and when home health went to see him today they said he was too weak for outpatient treatment and needed to go back to the hospital for possible inpatient rehab.  Wife reports that in his current condition of weakness she cannot safely take care of him at home.  Patient has dementia, and is verbal but not with coherent answers at the time of my exam.  This is reported to be at his baseline.  Patient cannot offer any further history at this time.  In the ED Temperature 99.6, heart rate 108-133 A. fib, respiratory rate 21, blood pressure 178/88, 100% on room air The heart rate in the 130s was not sustained. Hematology reviewed a white blood cell count of 12.8, hemoglobin 15.4 CHEM panel reveals a glucose of 207, BUN to creatinine ratio is 24 : 1.77 Covid and flu are negative Blood cultures pending EKG shows an heart rate of 124, A. fib, QTC of 462 Admission requested for PT consulted and recommendations regarding  discharge  Assessment & Plan: 1-generalized weakness and physical deconditioning -Appreciate physical therapy evaluation and recommendations; plan is for patient to go to skilled nursing facility for further care and rehab at discharge. -continue supportive care and assistance.  2-paroxysmal atrial fibrillation -Continue metoprolol and continue Eliquis -Rate has remained controlled and at this time will discontinue telemetry.  3-dementia with behavioral disturbances -Continue the use of Aricept and Seroquel -Continue constant reorientation and assistance.  4-recent history of GI bleed (rectal bleeding) -No overt bleeding appreciated -Hemoglobin level stable -Continue the use of MiraLAX as needed -Plan is for outpatient follow-up with gastroenterology service -Continue the use of Anusol  5-type 2 diabetes -Continue sliding scale insulin -Follow CBGs and further adjust hypoglycemic regimen as required.  6-hypertension -Continue amlodipine and metoprolol -Vital signs stable.  7-gastroesophageal reflux disease/hyperlipidemia -Will continue the use of a statins -Continue PPI.  8-DNR -CODE STATUS review with patient's wife, who expressed no resuscitation and no heroic interventions. -Continue to treat was treatable. -Wishes will be respected.   DVT prophylaxis: on eliquis.  Code Status: DNR. Family Communication: Wife at bedside. Disposition:   Status is: Inpatient  Dispo: The patient is from: home               Anticipated d/c is to: SNF              Anticipated d/c date is: to be determined.  Patient currently medically stable for discharge to a SNF for rehabilitation and further care. No CP, no nausea, no vomiting. Intermittent episodes of agitation and ongoing confusion reported by nursing staff.     Consultants:   None    Procedures:  See below for x-ray reports.    Antimicrobials:  None    Subjective: No chest pain, no nausea, no  vomiting.  Patient is afebrile currently.  Weak and significantly deconditioned.  Objective: Vitals:   12/20/19 1352 12/20/19 2342 12/21/19 0600 12/21/19 1331  BP: 124/66 136/62 128/60 121/67  Pulse: 100 83 77 68  Resp: 20 20 20 20   Temp: 98.8 F (37.1 C) (!) 100.5 F (38.1 C) (!) 100.5 F (38.1 C) 99 F (37.2 C)  TempSrc: Oral     SpO2: 100% 99% 100% 91%  Weight:      Height:        Intake/Output Summary (Last 24 hours) at 12/21/2019 1551 Last data filed at 12/21/2019 1000 Gross per 24 hour  Intake 120 ml  Output 800 ml  Net -680 ml   Filed Weights   12/19/19 2350  Weight: 86.5 kg    Examination: General exam: Afebrile, no chest pain, no nausea, no vomiting.  Pleasantly confused.  No major distress overnight events.  Weak and significantly deconditioned. Respiratory system: Clear to auscultation. Respiratory effort normal. Cardiovascular system:RRR.  No rubs, no gallops, no JVD appreciated on exam. Gastrointestinal system: Abdomen is nondistended, soft and nontender. No organomegaly or masses felt. Normal bowel sounds heard. Central nervous system: No focal neurological deficits. Extremities: No cyanosis or clubbing. Skin: No petechiae. Psychiatry: Judgement and insight appear impaired in the setting of underlying dementia.  Data Reviewed: I have personally reviewed following labs and imaging studies  CBC: Recent Labs  Lab 12/15/19 0841 12/15/19 1423 12/16/19 0241 12/16/19 0241 12/16/19 1503 12/18/19 1905 12/19/19 0053 12/19/19 1351 12/19/19 2225  WBC 10.7*  --  13.0*  --   --  12.8* 11.6* 12.1* 13.1*  NEUTROABS 8.5*  --   --   --   --  10.6*  --   --   --   HGB 16.8   < > 15.9   < > 15.7 15.4 14.1 15.7 14.8  HCT 50.3   < > 47.0   < > 46.9 45.4 42.4 47.8 45.4  MCV 98.8  --  100.4*  --   --  97.8 101.0* 101.1* 100.9*  PLT 226  --  215  --   --  211 198 234 254   < > = values in this interval not displayed.    Basic Metabolic Panel: Recent Labs  Lab  12/15/19 0841 12/16/19 0241 12/18/19 1905 12/19/19 0201  NA 139 142 137 141  K 4.2 5.6* 4.2 4.6  CL 99 109 104 105  CO2 26 25 23 22   GLUCOSE 177* 67* 207* 180*  BUN 28* 25* 24* 24*  CREATININE 1.94* 1.92* 1.77* 1.69*  CALCIUM 9.2 8.9 8.5* 8.6*  MG  --   --   --  1.9    GFR: Estimated Creatinine Clearance: 33.6 mL/min (A) (by C-G formula based on SCr of 1.69 mg/dL (H)).  Liver Function Tests: Recent Labs  Lab 12/15/19 0841 12/19/19 0201  AST 25 25  ALT 17 15  ALKPHOS 58 59  BILITOT 1.0 1.5*  PROT 7.5 6.5  ALBUMIN 4.0 3.1*    CBG: Recent Labs  Lab 12/20/19 1131 12/20/19 1642 12/20/19 2136 12/21/19 0733 12/21/19 1130  GLUCAP 109* 88 251* 109* 149*     Recent Results (from the past 240 hour(s))  Respiratory Panel by RT PCR (Flu A&B, Covid) - Nasopharyngeal Swab     Status: None   Collection Time: 12/15/19  9:45 AM   Specimen: Nasopharyngeal Swab  Result Value Ref Range Status   SARS Coronavirus 2 by RT PCR NEGATIVE NEGATIVE Final    Comment: (NOTE) SARS-CoV-2 target nucleic acids are NOT DETECTED.  The SARS-CoV-2 RNA is generally detectable in upper respiratoy specimens during the acute phase of infection. The lowest concentration of SARS-CoV-2 viral copies this assay can detect is 131 copies/mL. A negative result does not preclude SARS-Cov-2 infection and should not be used as the sole basis for treatment or other patient management decisions. A negative result may occur with  improper specimen collection/handling, submission of specimen other than nasopharyngeal swab, presence of viral mutation(s) within the areas targeted by this assay, and inadequate number of viral copies (<131 copies/mL). A negative result must be combined with clinical observations, patient history, and epidemiological information. The expected result is Negative.  Fact Sheet for Patients:  https://www.moore.com/  Fact Sheet for Healthcare Providers:    https://www.young.biz/  This test is no t yet approved or cleared by the [place] FDA and  has been authorized for detection and/or diagnosis of SARS-CoV-2 by FDA under an Emergency Use Authorization (EUA). This EUA will remain  in effect (meaning this test can be used) for the duration of the COVID-19 declaration under Section 564(b)(1) of the Act, 21 U.S.C. section 360bbb-3(b)(1), unless the authorization is terminated or revoked sooner.     Influenza A by PCR NEGATIVE NEGATIVE Final   Influenza B by PCR NEGATIVE NEGATIVE Final    Comment: (NOTE) The Xpert Xpress SARS-CoV-2/FLU/RSV assay is intended as an aid in  the diagnosis of influenza from Nasopharyngeal swab specimens and  should not be used as a sole basis for treatment. Nasal washings and  aspirates are unacceptable for Xpert Xpress SARS-CoV-2/FLU/RSV  testing.  Fact Sheet for Patients: https://www.moore.com/  Fact Sheet for Healthcare Providers: https://www.young.biz/  This test is not yet approved or cleared by the [place] FDA and  has been authorized for detection and/or diagnosis of SARS-CoV-2 by  FDA under an Emergency Use Authorization (EUA). This EUA will remain  in effect (meaning this test can be used) for the duration of the  Covid-19 declaration under Section 564(b)(1) of the Act, 21  U.S.C. section 360bbb-3(b)(1), unless the authorization is  terminated or revoked. Performed at [facility], [address] [place], [place] [address]   Resp Panel by RT PCR (RSV, Flu A&B, Covid) - Nasopharyngeal Swab     Status: None   Collection Time: 12/18/19  7:14 PM   Specimen: Nasopharyngeal Swab  Result Value Ref Range Status   SARS Coronavirus 2 by RT PCR NEGATIVE NEGATIVE Final    Comment: (NOTE) SARS-CoV-2 target nucleic acids are NOT DETECTED.  The SARS-CoV-2 RNA is generally detectable in upper respiratoy specimens during the acute phase of  infection. The lowest concentration of SARS-CoV-2 viral copies this assay can detect is 131 copies/mL. A negative result does not preclude SARS-Cov-2 infection and should not be used as the sole basis for treatment or other patient management decisions. A negative result may occur with  improper specimen collection/handling, submission of specimen other than nasopharyngeal swab, presence of viral mutation(s) within the areas targeted by this assay, and inadequate number of viral copies (<131 copies/mL). A negative  result must be combined with clinical observations, patient history, and epidemiological information. The expected result is Negative.  Fact Sheet for Patients:  https://www.moore.com/https://www.fda.gov/media/142436/download  Fact Sheet for Healthcare Providers:  https://www.young.biz/https://www.fda.gov/media/142435/download  This test is no t yet approved or cleared by the [place] FDA and  has been authorized for detection and/or diagnosis of SARS-CoV-2 by FDA under an Emergency Use Authorization (EUA). This EUA will remain  in effect (meaning this test can be used) for the duration of the COVID-19 declaration under Section 564(b)(1) of the Act, 21 U.S.C. section 360bbb-3(b)(1), unless the authorization is terminated or revoked sooner.     Influenza A by PCR NEGATIVE NEGATIVE Final   Influenza B by PCR NEGATIVE NEGATIVE Final    Comment: (NOTE) The Xpert Xpress SARS-CoV-2/FLU/RSV assay is intended as an aid in  the diagnosis of influenza from Nasopharyngeal swab specimens and  should not be used as a sole basis for treatment. Nasal washings and  aspirates are unacceptable for Xpert Xpress SARS-CoV-2/FLU/RSV  testing.  Fact Sheet for Patients: https://www.moore.com/https://www.fda.gov/media/142436/download  Fact Sheet for Healthcare Providers: https://www.young.biz/https://www.fda.gov/media/142435/download  This test is not yet approved or cleared by the [place] FDA and  has been authorized for detection and/or diagnosis of SARS-CoV-2  by  FDA under an Emergency Use Authorization (EUA). This EUA will remain  in effect (meaning this test can be used) for the duration of the  Covid-19 declaration under Section 564(b)(1) of the Act, 21  U.S.C. section 360bbb-3(b)(1), unless the authorization is  terminated or revoked.    Respiratory Syncytial Virus by PCR NEGATIVE NEGATIVE Final    Comment: (NOTE) Fact Sheet for Patients: https://www.moore.com/https://www.fda.gov/media/142436/download  Fact Sheet for Healthcare Providers: https://www.young.biz/https://www.fda.gov/media/142435/download  This test is not yet approved or cleared by the [place] FDA and  has been authorized for detection and/or diagnosis of SARS-CoV-2 by  FDA under an Emergency Use Authorization (EUA). This EUA will remain  in effect (meaning this test can be used) for the duration of the  COVID-19 declaration under Section 564(b)(1) of the Act, 21 U.S.C.  section 360bbb-3(b)(1), unless the authorization is terminated or  revoked. Performed at [facility], [address] [place], [place] [address]   Blood culture (routine x 2)     Status: None (Preliminary result)   Collection Time: 12/18/19  7:19 PM   Specimen: BLOOD LEFT FOREARM  Result Value Ref Range Status   Specimen Description BLOOD LEFT FOREARM  Final   Special Requests   Final    BOTTLES DRAWN AEROBIC AND ANAEROBIC Blood Culture adequate volume   Culture   Final    NO GROWTH 3 DAYS Performed at [facility], [address] [place], [place] [address]    Report Status PENDING  Incomplete  Blood culture (routine x 2)     Status: None (Preliminary result)   Collection Time: 12/18/19  7:32 PM   Specimen: Left Antecubital; Blood  Result Value Ref Range Status   Specimen Description LEFT ANTECUBITAL  Final   Special Requests   Final    BOTTLES DRAWN AEROBIC AND ANAEROBIC Blood Culture adequate volume   Culture   Final    NO GROWTH 3 DAYS Performed at [facility], [address] [place], [place] [address]    Report Status  PENDING  Incomplete     Radiology Studies: No results found.  Scheduled Meds: . amLODipine  2.5 mg Oral Daily  . apixaban  2.5 mg Oral BID  . donepezil  10 mg Oral QHS  .  hydrocortisone  25 mg Rectal BID  . insulin aspart  0-15 Units Subcutaneous TID WC  . insulin aspart  0-5 Units Subcutaneous QHS  . insulin detemir  25 Units Subcutaneous QHS  . metoprolol tartrate  50 mg Oral BID  . pantoprazole  40 mg Oral Daily  . polyethylene glycol  17 g Oral BID  . QUEtiapine  25 mg Oral BID  . simvastatin  20 mg Oral Daily   Continuous Infusions:   LOS: 3 days    Time spent: 30 minutes    Vassie Loll, MD [facility]   To contact the attending provider between 7A-7P or the covering provider during after hours 7P-7A, please log into the web site [email or web address] and access using universal [place] password for that web site. If you do not have the password, please call the hospital operator.  12/21/2019, 3:51 PM

## 2019-12-22 DIAGNOSIS — G309 Alzheimer's disease, unspecified: Secondary | ICD-10-CM | POA: Diagnosis not present

## 2019-12-22 DIAGNOSIS — M1A9XX0 Chronic gout, unspecified, without tophus (tophi): Secondary | ICD-10-CM

## 2019-12-22 DIAGNOSIS — R531 Weakness: Secondary | ICD-10-CM | POA: Diagnosis not present

## 2019-12-22 DIAGNOSIS — I10 Essential (primary) hypertension: Secondary | ICD-10-CM | POA: Diagnosis not present

## 2019-12-22 DIAGNOSIS — R195 Other fecal abnormalities: Secondary | ICD-10-CM

## 2019-12-22 DIAGNOSIS — F0281 Dementia in other diseases classified elsewhere with behavioral disturbance: Secondary | ICD-10-CM

## 2019-12-22 LAB — CBC
HCT: 43.1 % (ref 39.0–52.0)
Hemoglobin: 14.1 g/dL (ref 13.0–17.0)
MCH: 33.1 pg (ref 26.0–34.0)
MCHC: 32.7 g/dL (ref 30.0–36.0)
MCV: 101.2 fL — ABNORMAL HIGH (ref 80.0–100.0)
Platelets: 247 10*3/uL (ref 150–400)
RBC: 4.26 MIL/uL (ref 4.22–5.81)
RDW: 13.7 % (ref 11.5–15.5)
WBC: 12.2 10*3/uL — ABNORMAL HIGH (ref 4.0–10.5)
nRBC: 0 % (ref 0.0–0.2)

## 2019-12-22 LAB — BASIC METABOLIC PANEL
Anion gap: 12 (ref 5–15)
BUN: 45 mg/dL — ABNORMAL HIGH (ref 8–23)
CO2: 23 mmol/L (ref 22–32)
Calcium: 8.6 mg/dL — ABNORMAL LOW (ref 8.9–10.3)
Chloride: 107 mmol/L (ref 98–111)
Creatinine, Ser: 2.01 mg/dL — ABNORMAL HIGH (ref 0.61–1.24)
GFR, Estimated: 30 mL/min — ABNORMAL LOW (ref >60)
Glucose, Bld: 159 mg/dL — ABNORMAL HIGH (ref 70–99)
Potassium: 4 mmol/L (ref 3.5–5.1)
Sodium: 142 mmol/L (ref 135–145)

## 2019-12-22 LAB — GLUCOSE, CAPILLARY
Glucose-Capillary: 52 mg/dL — ABNORMAL LOW (ref 70–99)
Glucose-Capillary: 200 mg/dL — ABNORMAL HIGH (ref 70–99)
Glucose-Capillary: 167 mg/dL — ABNORMAL HIGH (ref 70–99)

## 2019-12-22 LAB — SARS CORONAVIRUS 2 BY RT PCR (HOSPITAL ORDER, PERFORMED IN ~~LOC~~ HOSPITAL LAB): SARS Coronavirus 2: NEGATIVE

## 2019-12-22 MED ORDER — TRESIBA FLEXTOUCH 100 UNIT/ML ~~LOC~~ SOPN: 22.0000 [IU] | PEN_INJECTOR | Freq: Every day | SUBCUTANEOUS | Status: DC

## 2019-12-22 MED ORDER — ALLOPURINOL 100 MG PO TABS: 100.0000 mg | ORAL_TABLET | Freq: Every day | ORAL | Status: DC

## 2019-12-22 MED ORDER — PANTOPRAZOLE SODIUM 40 MG PO TBEC
40.0000 mg | DELAYED_RELEASE_TABLET | Freq: Every day | ORAL | Status: DC
Start: 2019-12-23 — End: 2020-03-01

## 2019-12-22 MED ORDER — COLCHICINE 0.6 MG PO TABS: 0.6000 mg | ORAL_TABLET | Freq: Every day | ORAL | Status: DC

## 2019-12-22 NOTE — Progress Notes (Signed)
Attempted to call report to [facility] where pt will go at discharge.  No answer at this time, will attempt again.

## 2019-12-22 NOTE — Care Management Important Message (Signed)
Important Message  Patient Details  Name: Matthew Boyd MRN: [id number] Date of Birth: [birth year]   Medicare Important Message Given:  Yes     Corey Harold 12/22/2019, 3:10 PM

## 2019-12-22 NOTE — TOC Transition Note (Signed)
Transition of Care [facility]) - CM/SW Discharge Note   Patient Details  Name: Matthew Boyd MRN: [id number] Date of Birth: 12-20-1933  Transition of [facility]) CM/SW Contact:  Elliot Gault, LCSW Phone Number: 12/22/2019, 2:52 PM   Clinical Narrative:     Pt stable for dc per MD. Sherron Monday with pt's wife, Bonita Quin, this AM to review bed offers and [facility] was selected. Updated Meryle Ready at [facility] who stated that they could accept pt today. Insurance authorized the rehab.   DC clinical will be sent electronically. EMS form printed to the floor. EMS called. RN to call report.  There are no other TOC needs for dc.  Final next level of care: Skilled Nursing Facility Barriers to Discharge: Barriers Resolved   Patient Goals and CMS Choice Patient states their goals for this hospitalization and ongoing recovery are:: Discharge to rehab CMS Medicare.gov Compare Post Acute Care list provided to:: Patient Represenative (must comment) [name] (wife)) Choice offered to / list presented to : Spouse  Discharge Placement              Patient chooses bed at: [facility] Patient to be transferred to facility by: EMS Name of family member notified: Bonita Quin Patient and family notified of of transfer: 12/22/19  Discharge Plan and Services In-house Referral: Clinical Social Work Discharge Planning Services: NA Post Acute Care Choice: Skilled Nursing Facility                               Social Determinants of Health (SDOH) Interventions     Readmission Risk Interventions Readmission Risk Prevention Plan 12/21/2019  Transportation Screening Complete  PCP or Specialist Appt within 3-5 Days Not Complete  Not Complete comments SNF is the anticipated discharge plan  HRI or Home Care Consult Complete  Social Work Consult for Recovery Care Planning/Counseling Complete  Palliative Care Screening Not Applicable  Medication Review Oceanographer) Complete  Some  recent data might be hidden

## 2019-12-22 NOTE — Discharge Summary (Signed)
Physician Discharge Summary  Matthew Boyd WUJ:[id number] DOB: [birth year] DOA: 12/18/2019  PCP: Matthew GrippeKim, James, MD  Admit date: 12/18/2019 Discharge date: 12/22/2019  Time spent: 35 minutes  Recommendations for Outpatient Follow-up:  1. Repeat basic metabolic panel to follow lites and renal function 2. 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  Patient will be discharged to skilled nursing facility for further care, rehabilitation and conditioning.  CODE STATUS: DNR.  Diet recommendation: Heart healthy and modified carbohydrates.  Filed Weights   12/19/19 2350  Weight: 86.5 kg    History of present illness:  As per H&P written by Matthew Boyd on 12/19/19 Matthew Boyd a86 y.o.male,with history of paroxysmal atrial fibrillation, hypertension, diabetes, kidney disease, and more who was discharged from the hospital on 12/17/2019 presents for generalized weakness. Patient's last admission was for rectal bleeding in the setting of constipation. There was a question of a stercoral ulcer patient was seen by GI, but there was no further bleeding and hemoglobin was stable so there was no plan for colonoscopy at this time. Patient was started on Anusol suppository. According to chart review when patient returned home yesterday he was too weak to get out of the car on his own. Family actually had to call EMS to get him out of the car, and since then he had just been laying in his bed. Home health was set up, and when home health went to see him today they said he was too weak for outpatient treatment and needed to go back to the hospital for possible inpatient rehab. Wife reports that in his current  condition of weakness she cannot safely take care of him at home. Patient has dementia, and is verbal but not with coherent answers at the time of my exam. This is reported to be at his baseline. Patient cannot offer any further history at this time.  In the ED Temperature 99.6, heart rate 108-133 A. fib, respiratory rate 21, blood pressure 178/88, 100% on room air The heart rate in the 130s was not sustained. Hematology reviewed a white blood cell count of 12.8, hemoglobin 15.4 CHEM panel reveals a glucose of 207, BUN to creatinine ratio is 24: 1.77 Covid and flu are negative Blood cultures pending EKG shows an heart rate of 124, A. fib, QTC of 462 Admission requested for PT consulted and recommendations regarding discharge  Hospital Course:  1-generalized weakness and physical deconditioning -Appreciate physical therapy evaluation and recommendations; plan is for patient to go to skilled nursing facility for further care and rehab at discharge. -continue supportive care and assistance.  2-paroxysmal atrial fibrillation -Continue metoprolol and continue Eliquis -Rate has remained controlled and stable.  3-dementia with behavioral disturbances -Continue the use of Aricept and Seroquel -Continue constant reorientation and assistance.  4-recent history of GI bleed (rectal bleeding) -No overt bleeding appreciated -Hemoglobin level stable -Continue the use of MiraLAX as needed -Plan is for outpatient follow-up with gastroenterology service -Continue the use of Anusol  5-type 2 diabetes -Continue sliding scale insulin -Follow CBGs and further adjust hypoglycemic regimen as required. -Modified carbohydrate diet recommended.  6-hypertension -Continue amlodipine and metoprolol -Vital signs stable.  7-gastroesophageal reflux disease/hyperlipidemia -Will continue the use of a statins -Continue PPI.  8-DNR -CODE STATUS review with patient's wife, who expressed no  resuscitation and no heroic interventions. -Continue to treat was treatable. -Wishes will be respected.  9-history of gout -Patient will be treated with daily colchicine and allopurinol -Advised to maintain adequate hydration.  Procedures: See below for x-ray reports.  Consultations:  None  Discharge Exam: Vitals:   12/22/19 0617 12/22/19 0618  BP: (!) 138/94   Pulse: (!) 105   Resp: 20   Temp:  98.7 F (37.1 C)  SpO2: 100%    General exam: Afebrile, no chest pain, no nausea, no vomiting.  Pleasantly confused.  No major distress overnight events. Remains Weak and significantly deconditioned. Responding to voice commands and following simple commands.  Patient expressed elbow and great toe pain (per wife prior history of gout reported). Respiratory system: Clear to auscultation. Respiratory effort normal.  No requiring oxygen supplementation. Cardiovascular system:RRR.  No rubs, no gallops, no JVD appreciated on exam. Gastrointestinal system: Abdomen is nondistended, soft and nontender. No organomegaly or masses felt. Normal bowel sounds heard. Central nervous system: No focal neurological deficits. Extremities: No cyanosis or clubbing. Skin: No petechiae. Psychiatry: Judgement and insight appear impaired in the setting of underlying dementia.   Discharge Instructions    Allergies as of 12/22/2019   No Known Allergies     Medication List    TAKE these medications   acetaminophen 325 MG tablet Commonly known as: TYLENOL Take 325 mg by mouth in the morning and at bedtime.   allopurinol 100 MG tablet Commonly known as: Zyloprim Take 1 tablet (100 mg total) by mouth daily.   amLODipine 2.5 MG tablet Commonly known as: NORVASC Take 2.5 mg by mouth daily.   apixaban 2.5 MG Tabs tablet Commonly known as: ELIQUIS Take 1 tablet (2.5 mg total) by mouth 2 (two) times daily.   colchicine 0.6 MG tablet Take 1 tablet (0.6 mg total) by mouth daily.   donepezil 10 MG  tablet Commonly known as: ARICEPT Take 10 mg by mouth at bedtime.   hydrocortisone 25 MG suppository Commonly known as: ANUSOL-HC Place 1 suppository (25 mg total) rectally 2 (two) times daily.   MELATONIN PO Take 5-10 mg by mouth daily as needed (sleep).   metoprolol tartrate 50 MG tablet Commonly known as: LOPRESSOR Take 50 mg by mouth 2 (two) times daily.   pantoprazole 40 MG tablet Commonly known as: PROTONIX Take 1 tablet (40 mg total) by mouth daily. Start taking on: December 23, 2019   polyethylene glycol 17 g packet Commonly known as: MIRALAX / GLYCOLAX Take 17 g by mouth 2 (two) times daily.   QUEtiapine 25 MG tablet Commonly known as: SEROQUEL Take 12.5 mg by mouth 2 (two) times daily.   simvastatin 20 MG tablet Commonly known as: ZOCOR Take 20 mg by mouth daily.   Evaristo Bury FlexTouch 100 UNIT/ML FlexTouch Pen Generic drug: insulin degludec Inject 22 Units into the skin daily. What changed: how much to take      No Known Allergies  Contact information for follow-up providers    Matthew Grippe, MD. Schedule an appointment as soon as possible for a visit in 10 day(s).   Specialty: Internal Medicine Why: After discharge from the skilled nursing facility Contact information: [address] [place] [address] [phone]        Antoine Poche, MD .   Specialty: Cardiology Contact information: [address] [place] [address] [phone]            Contact information for after-discharge care  Destination    [facility] Preferred SNF .   Service: Skilled Nursing Contact information: [address] [place] [address] [phone]                  The results of significant diagnostics from this hospitalization (including imaging, microbiology, ancillary and laboratory) are listed below for reference.    Significant Diagnostic Studies: DG CHEST PORT 1  VIEW  Result Date: 12/19/2019 CLINICAL DATA:  Recent admission for rectal bleeding/ulcer, weakness EXAM: PORTABLE CHEST 1 VIEW COMPARISON:  11/24/2015 FINDINGS: Lungs are clear.  No pleural effusion or pneumothorax. The heart is normal in size. IMPRESSION: No evidence of acute cardiopulmonary disease. Electronically Signed   By: Charline Bills M.D.   On: 12/19/2019 05:16    Microbiology: Recent Results (from the past 240 hour(s))  Respiratory Panel by RT PCR (Flu A&B, Covid) - Nasopharyngeal Swab     Status: None   Collection Time: 12/15/19  9:45 AM   Specimen: Nasopharyngeal Swab  Result Value Ref Range Status   SARS Coronavirus 2 by RT PCR NEGATIVE NEGATIVE Final    Comment: (NOTE) SARS-CoV-2 target nucleic acids are NOT DETECTED.  The SARS-CoV-2 RNA is generally detectable in upper respiratoy specimens during the acute phase of infection. The lowest concentration of SARS-CoV-2 viral copies this assay can detect is 131 copies/mL. A negative result does not preclude SARS-Cov-2 infection and should not be used as the sole basis for treatment or other patient management decisions. A negative result may occur with  improper specimen collection/handling, submission of specimen other than nasopharyngeal swab, presence of viral mutation(s) within the areas targeted by this assay, and inadequate number of viral copies (<131 copies/mL). A negative result must be combined with clinical observations, patient history, and epidemiological information. The expected result is Negative.  Fact Sheet for Patients:  https://www.moore.com/  Fact Sheet for Healthcare Providers:  https://www.young.biz/  This test is no t yet approved or cleared by the [place] FDA and  has been authorized for detection and/or diagnosis of SARS-CoV-2 by FDA under an Emergency Use Authorization (EUA). This EUA will remain  in effect (meaning this test can be used) for the  duration of the COVID-19 declaration under Section 564(b)(1) of the Act, 21 U.S.C. section 360bbb-3(b)(1), unless the authorization is terminated or revoked sooner.     Influenza A by PCR NEGATIVE NEGATIVE Final   Influenza B by PCR NEGATIVE NEGATIVE Final    Comment: (NOTE) The Xpert Xpress SARS-CoV-2/FLU/RSV assay is intended as an aid in  the diagnosis of influenza from Nasopharyngeal swab specimens and  should not be used as a sole basis for treatment. Nasal washings and  aspirates are unacceptable for Xpert Xpress SARS-CoV-2/FLU/RSV  testing.  Fact Sheet for Patients: https://www.moore.com/  Fact Sheet for Healthcare Providers: https://www.young.biz/  This test is not yet approved or cleared by the [place] FDA and  has been authorized for detection and/or diagnosis of SARS-CoV-2 by  FDA under an Emergency Use Authorization (EUA). This EUA will remain  in effect (meaning this test can be used) for the duration of the  Covid-19 declaration under Section 564(b)(1) of the Act, 21  U.S.C. section 360bbb-3(b)(1), unless the authorization is  terminated or revoked. Performed at [facility], [address] [place], [place] [address]   Resp Panel by RT PCR (RSV, Flu A&B, Covid) - Nasopharyngeal Swab     Status: None   Collection Time: 12/18/19  7:14 PM  Specimen: Nasopharyngeal Swab  Result Value Ref Range Status   SARS Coronavirus 2 by RT PCR NEGATIVE NEGATIVE Final    Comment: (NOTE) SARS-CoV-2 target nucleic acids are NOT DETECTED.  The SARS-CoV-2 RNA is generally detectable in upper respiratoy specimens during the acute phase of infection. The lowest concentration of SARS-CoV-2 viral copies this assay can detect is 131 copies/mL. A negative result does not preclude SARS-Cov-2 infection and should not be used as the sole basis for treatment or other patient management decisions. A negative result may occur with  improper  specimen collection/handling, submission of specimen other than nasopharyngeal swab, presence of viral mutation(s) within the areas targeted by this assay, and inadequate number of viral copies (<131 copies/mL). A negative result must be combined with clinical observations, patient history, and epidemiological information. The expected result is Negative.  Fact Sheet for Patients:  https://www.moore.com/  Fact Sheet for Healthcare Providers:  https://www.young.biz/  This test is no t yet approved or cleared by the [place] FDA and  has been authorized for detection and/or diagnosis of SARS-CoV-2 by FDA under an Emergency Use Authorization (EUA). This EUA will remain  in effect (meaning this test can be used) for the duration of the COVID-19 declaration under Section 564(b)(1) of the Act, 21 U.S.C. section 360bbb-3(b)(1), unless the authorization is terminated or revoked sooner.     Influenza A by PCR NEGATIVE NEGATIVE Final   Influenza B by PCR NEGATIVE NEGATIVE Final    Comment: (NOTE) The Xpert Xpress SARS-CoV-2/FLU/RSV assay is intended as an aid in  the diagnosis of influenza from Nasopharyngeal swab specimens and  should not be used as a sole basis for treatment. Nasal washings and  aspirates are unacceptable for Xpert Xpress SARS-CoV-2/FLU/RSV  testing.  Fact Sheet for Patients: https://www.moore.com/  Fact Sheet for Healthcare Providers: https://www.young.biz/  This test is not yet approved or cleared by the [place] FDA and  has been authorized for detection and/or diagnosis of SARS-CoV-2 by  FDA under an Emergency Use Authorization (EUA). This EUA will remain  in effect (meaning this test can be used) for the duration of the  Covid-19 declaration under Section 564(b)(1) of the Act, 21  U.S.C. section 360bbb-3(b)(1), unless the authorization is  terminated or revoked.     Respiratory Syncytial Virus by PCR NEGATIVE NEGATIVE Final    Comment: (NOTE) Fact Sheet for Patients: https://www.moore.com/  Fact Sheet for Healthcare Providers: https://www.young.biz/  This test is not yet approved or cleared by the [place] FDA and  has been authorized for detection and/or diagnosis of SARS-CoV-2 by  FDA under an Emergency Use Authorization (EUA). This EUA will remain  in effect (meaning this test can be used) for the duration of the  COVID-19 declaration under Section 564(b)(1) of the Act, 21 U.S.C.  section 360bbb-3(b)(1), unless the authorization is terminated or  revoked. Performed at [facility], [address], [place], [place] [address]   Blood culture (routine x 2)     Status: None (Preliminary result)   Collection Time: 12/18/19  7:19 PM   Specimen: BLOOD LEFT FOREARM  Result Value Ref Range Status   Specimen Description BLOOD LEFT FOREARM  Final   Special Requests   Final    BOTTLES DRAWN AEROBIC AND ANAEROBIC Blood Culture adequate volume   Culture   Final    NO GROWTH 4 DAYS Performed at [facility], [address], [place], [place] [address]    Report Status PENDING  Incomplete  Blood culture (routine  x 2)     Status: None (Preliminary result)   Collection Time: 12/18/19  7:32 PM   Specimen: Left Antecubital; Blood  Result Value Ref Range Status   Specimen Description LEFT ANTECUBITAL  Final   Special Requests   Final    BOTTLES DRAWN AEROBIC AND ANAEROBIC Blood Culture adequate volume   Culture   Final    NO GROWTH 4 DAYS Performed at [facility], [address], [place], [place] [address]    Report Status PENDING  Incomplete  SARS Coronavirus 2 by RT PCR (hospital order, performed in [facility] hospital lab) Nasopharyngeal Nasopharyngeal Swab     Status: None   Collection Time: 12/22/19 10:30 AM   Specimen: Nasopharyngeal Swab  Result Value Ref Range Status   SARS Coronavirus 2 NEGATIVE  NEGATIVE Final    Comment: (NOTE) SARS-CoV-2 target nucleic acids are NOT DETECTED.  The SARS-CoV-2 RNA is generally detectable in upper and lower respiratory specimens during the acute phase of infection. The lowest concentration of SARS-CoV-2 viral copies this assay can detect is 250 copies / mL. A negative result does not preclude SARS-CoV-2 infection and should not be used as the sole basis for treatment or other patient management decisions.  A negative result may occur with improper specimen collection / handling, submission of specimen other than nasopharyngeal swab, presence of viral mutation(s) within the areas targeted by this assay, and inadequate number of viral copies (<250 copies / mL). A negative result must be combined with clinical observations, patient history, and epidemiological information.  Fact Sheet for Patients:   BoilerBrush.com.cy  Fact Sheet for Healthcare Providers: https://pope.com/  This test is not yet approved or  cleared by the [place] FDA and has been authorized for detection and/or diagnosis of SARS-CoV-2 by FDA under an Emergency Use Authorization (EUA).  This EUA will remain in effect (meaning this test can be used) for the duration of the COVID-19 declaration under Section 564(b)(1) of the Act, 21 U.S.C. section 360bbb-3(b)(1), unless the authorization is terminated or revoked sooner.  Performed at [facility], [address], [place], [place] [address]      Labs: Basic Metabolic Panel: Recent Labs  Lab 12/16/19 0241 12/18/19 1905 12/19/19 0201 12/22/19 0926  NA 142 137 141 142  K 5.6* 4.2 4.6 4.0  CL 109 104 105 107  CO2 GLUCOSE 67* 207* 180* 159*  BUN 25* 24* 24* 45*  CREATININE 1.92* 1.77* 1.69* 2.01*  CALCIUM 8.9 8.5* 8.6* 8.6*  MG  --   --  1.9  --    Liver Function Tests: Recent Labs  Lab 12/19/19 0201  AST 25  ALT 15  ALKPHOS 59  BILITOT 1.5*   PROT 6.5  ALBUMIN 3.1*   CBC: Recent Labs  Lab 12/18/19 1905 12/19/19 0053 12/19/19 1351 12/19/19 2225 12/22/19 0926  WBC 12.8* 11.6* 12.1* 13.1* 12.2*  NEUTROABS 10.6*  --   --   --   --   HGB 15.4 14.1 15.7 14.8 14.1  HCT 45.4 42.4 47.8 45.4 43.1  MCV 97.8 101.0* 101.1* 100.9* 101.2*  PLT 211 198 234 254 247    CBG: Recent Labs  Lab 12/21/19 1130 12/21/19 1624 12/21/19 2137 12/22/19 0742 12/22/19 1139  GLUCAP 149* 221* 314* 52* 200*   Signed:  Vassie Loll MD.  [facility] 12/22/2019, 2:52 PM

## 2019-12-22 NOTE — Progress Notes (Signed)
Inpatient Diabetes Program Recommendations  AACE/ADA: New Consensus Statement on Inpatient Glycemic Control (2015)  Target Ranges:  Prepandial:   less than 140 mg/dL      Peak postprandial:   less than 180 mg/dL (1-2 hours)      Critically ill patients:  140 - 180 mg/dL   Lab Results  Component Value Date   GLUCAP 52 (L) 12/22/2019   HGBA1C 6.6 (H) 12/15/2019    Review of Glycemic Control Results for NIKOLIS, BERENT (MRN [id number]) as of 12/22/2019 11:07  Ref. Range 12/21/2019 11:30 12/21/2019 16:24 12/21/2019 21:37 12/22/2019 07:42  Glucose-Capillary Latest Ref Range: 70 - 99 mg/dL 573 (H) 220 (H) 254 (H) 52 (L)   Diabetes history: Type 2 DM Outpatient Diabetes medications: Tresiba 30 units QD Current orders for Inpatient glycemic control: Levemir 25 units QHS, Novolog 0-15 units TID & HS  Inpatient Diabetes Program Recommendations:    Noted hypoglycemia this AM of 52 mg/dL. May want to consider reducing Levemir to 22 units QHS.  Thanks, Lujean Rave, MSN, RNC-OB Diabetes Coordinator [phone] (8a-5p)

## 2019-12-22 NOTE — Care Management Important Message (Signed)
Important Message  Patient Details  Name: Matthew Boyd MRN: [id number] Date of Birth: Feb 29, 1934   Medicare Important Message Given:  Yes     Corey Harold 12/22/2019, 2:48 PM

## 2019-12-22 NOTE — Progress Notes (Signed)
OT Cancellation Note  Patient Details Name: Matthew Boyd MRN: [id number] DOB: June 29, 1933   Cancelled Treatment:    Reason Eval/Treat Not Completed: Medical issues which prohibited therapy. Pt BG at 52 this am, nsg staff brought orange juice for pt shortly after OT arrival. Pt lying supine in bed mumbling to himself, unable to focus on OT and converse or follow instructions. Pt guarding right elbow and hand, would not allow for OT to mobilize or assess area. Wife reports she assists pt with ADLs at baseline, however he is able to participate and follow instructions. Wife reports pt is able to hold conversations and answer questions at baseline as well. Pt unable to participate in evaluation this am due to cognition and inability to follow directions, will check back as schedule allows.   [name], OTR/L  [phone] 12/22/2019, 8:05 AM

## 2019-12-23 LAB — CULTURE, BLOOD (ROUTINE X 2)
Culture: NO GROWTH
Culture: NO GROWTH
Special Requests: ADEQUATE
Special Requests: ADEQUATE

## 2019-12-23 NOTE — Progress Notes (Signed)
Pt discharged to [facility] via [facility]. Report called to Staci Righter, Lpn. Wife at bedside at time of departure.  Personal belongings sent to SNF with pt. No distress noted at time of discharge.

## 2020-01-06 ENCOUNTER — Other Ambulatory Visit: Payer: Self-pay

## 2020-01-06 ENCOUNTER — Ambulatory Visit (INDEPENDENT_AMBULATORY_CARE_PROVIDER_SITE_OTHER): Payer: Medicare Other | Admitting: Gastroenterology

## 2020-01-06 ENCOUNTER — Encounter (INDEPENDENT_AMBULATORY_CARE_PROVIDER_SITE_OTHER): Payer: Self-pay | Admitting: Gastroenterology

## 2020-01-06 VITALS — BP 114/51 | HR 71 | Temp 97.8°F | Ht 70.0 in | Wt 178.0 lb

## 2020-01-06 DIAGNOSIS — K625 Hemorrhage of anus and rectum: Secondary | ICD-10-CM | POA: Diagnosis not present

## 2020-01-06 DIAGNOSIS — K5900 Constipation, unspecified: Secondary | ICD-10-CM

## 2020-01-06 NOTE — Patient Instructions (Signed)
Continue Miralax twice a day

## 2020-01-06 NOTE — Progress Notes (Signed)
Matthew Boyd, M.D. Gastroenterology & Hepatology [facility] [address] [place], [place] [address]  Primary Care Physician: [name], MD [address] [place] [address]  I will communicate my assessment and recommendations to the referring MD via EMR. "Note: Occasional unusual wording and randomly placed punctuation marks may result from the use of speech recognition technology to transcribe this document"  Problems: 1. Rectal bleeding due to stercoral ulcer 2. Constipation  History of Present Illness: Matthew Boyd is a 86 year old male with past medical history of advanced dementia, hypertension, hyperlipidemia, CKD, A. fib on Eliquis and prior myocardial infarction, coming to clinic for follow-up after recent hospitalization for rectal bleeding.  The patient was admitted to the hospital on 12/15/2019 after presenting new onset large amount of rectal bleeding the day before his admission.  He was presenting significant constipation before his symptoms started.  His hemoglobin was 16.8 upon admission with a normal BUN compared to his baseline.  He was hemodynamically stable.  Rectal exam showed presence of large amount of stool in his rectal vault.  The patient was given tapwater enemas and he was started on MiraLAX twice a day.  No endoscopic interventions were performed and he did not present any more episodes of rectal bleeding, for which the patient was discharged to rehab facility with laxative regimen.  Patient comes to the office with the MA from his rehab facility.  The patient does not respond to questions but follows commands.  She reports that he has had at least 1 bowel movement per day and has no presented any episodes of hematochezia during his stay at the facility.  Has not presented any melena.  He is more active than in the past and is able to stand on his own.  He is eating with the help of the MA  in the facility.  She denies that he has complained of having any nausea, vomiting, fever, chills, hematemesis, abdominal distention, abdominal pain, diarrhea, jaundice.  The labs from his rehab facility showed that his most recent hemoglobin was 16.4 on 12/25/2019.  Past Medical History: Past Medical History:  Diagnosis Date   Arteriosclerotic cardiovascular disease (ASCVD)    prior MI by echocardiography; stress nuclear -inferior scar; no prior cath   Benign prostatic hypertrophy    s/p transurethral resection of the prostate   Chronic kidney disease    Creatinine of 1.5 in 11/03 and 1.8 in 10/08; 1.75 in 09/2009   Diabetes mellitus type 2, insulin dependent (HCC)    Hyperlipidemia    Hypertension    Nephrolithiasis    surgical stone extractionx2   Paroxysmal atrial fibrillation (HCC)  10/2009   onset in 10/2009; anticoagulation   Tobacco abuse, in remission    25 pack years, discontinued in 1990    Past Surgical History: Past Surgical History:  Procedure Laterality Date   Surgical removal of renal calculi     3   TRANSURETHRAL RESECTION OF PROSTATE      Family History: Family History  Problem Relation Age of Onset   Diabetes Mother    Heart attack Father    Heart disease Sister    Diabetes Sister    Colon cancer Neg Hx     Social History: Social History   Tobacco Use  Smoking Status Former Smoker   Packs/day: 1.00   Years: 25.00   Pack years: 25.00   Types: Cigarettes   Start date: 08/29/1955   Quit date:  03/05/1988   Years since quitting: 31.8  Smokeless Tobacco Never Used  Tobacco Comment   Smoked 25 years   Social History   Substance and Sexual Activity  Alcohol Use Not Currently   Alcohol/week: 0.0 standard drinks   Comment: Excessive use in the 1990s 04/08/14- no longer drinks    Social History   Substance and Sexual Activity  Drug Use Not Currently    Allergies: No Known Allergies  Medications: Current Outpatient  Medications  Medication Sig Dispense Refill   acetaminophen (TYLENOL) 325 MG tablet Take 325 mg by mouth in the morning and at bedtime.      allopurinol (ZYLOPRIM) 100 MG tablet Take 1 tablet (100 mg total) by mouth daily.     amLODipine (NORVASC) 2.5 MG tablet Take 2.5 mg by mouth daily.     apixaban (ELIQUIS) 2.5 MG TABS tablet Take 1 tablet (2.5 mg total) by mouth 2 (two) times daily. 60 tablet    donepezil (ARICEPT) 10 MG tablet Take 10 mg by mouth at bedtime.      haloperidol lactate (HALDOL) 5 MG/ML injection Inject 2 mg into the muscle in the morning and at bedtime. As needed for aggressive behavior.     hydrocortisone (ANUSOL-HC) 25 MG suppository Place 1 suppository (25 mg total) rectally 2 (two) times daily. 12 suppository 0   insulin aspart (NOVOLOG) 100 UNIT/ML injection Inject into the skin 3 (three) times daily before meals. 3 x per day per ss     MELATONIN PO Take 5-10 mg by mouth daily as needed (sleep).     metoprolol (LOPRESSOR) 50 MG tablet Take 50 mg by mouth 2 (two) times daily.       pantoprazole (PROTONIX) 40 MG tablet Take 1 tablet (40 mg total) by mouth daily.     polyethylene glycol (MIRALAX / GLYCOLAX) 17 g packet Take 17 g by mouth 2 (two) times daily. 14 each 0   QUEtiapine (SEROQUEL) 25 MG tablet Take 12.5 mg by mouth 2 (two) times daily.     simvastatin (ZOCOR) 20 MG tablet Take 20 mg by mouth daily.  3   TRESIBA FLEXTOUCH 100 UNIT/ML FlexTouch Pen Inject 22 Units into the skin daily. (Patient taking differently: Inject 20 Units into the skin daily. )     colchicine 0.6 MG tablet Take 1 tablet (0.6 mg total) by mouth daily. (Patient not taking: Reported on 01/06/2020)     No current facility-administered medications for this visit.    Review of Systems: GENERAL: negative for malaise, night sweats HEENT: No changes in hearing or vision, no nose bleeds or other nasal problems. NECK: Negative for lumps, goiter, pain and significant neck  swelling RESPIRATORY: Negative for cough, wheezing CARDIOVASCULAR: Negative for chest pain, leg swelling, palpitations, orthopnea GI: SEE HPI MUSCULOSKELETAL: Negative for joint pain or swelling, back pain, and muscle pain. SKIN: Negative for lesions, rash PSYCH: Negative for sleep disturbance, mood disorder and recent psychosocial stressors. HEMATOLOGY Negative for prolonged bleeding, bruising easily, and swollen nodes. ENDOCRINE: Negative for cold or heat intolerance, polyuria, polydipsia and goiter. NEURO: negative for tremor, gait imbalance, syncope and seizures. The remainder of the review of systems is noncontributory.   Physical Exam: BP (!) 114/51 (BP Location: Right Arm, Patient Position: Sitting, Cuff Size: Normal)    Pulse 71    Temp 97.8 F (36.6 C) (Oral)    Ht 5\' 10"  (1.778 m)    Wt 178 lb (80.7 kg)    BMI 25.54 kg/m  GENERAL: The  patient is alert but it is unclear if he is oriented as he does not respond any questions thoroughly, in no acute distress.  Sitting in wheelchair. HEENT: Head is normocephalic and atraumatic. EOMI are intact. Mouth is well hydrated and without lesions. NECK: Supple. No masses LUNGS: Clear to auscultation. No presence of rhonchi/wheezing/rales. Adequate chest expansion HEART: RRR, normal s1 and s2. ABDOMEN: Soft, nontender, no guarding, no peritoneal signs, and nondistended. BS +. No masses. EXTREMITIES: Without any cyanosis, clubbing, rash, lesions or edema. NEUROLOGICno focal motor deficit. SKIN: no jaundice, no rashes  Imaging/Labs: as above  I personally reviewed and interpreted the available labs, imaging and endoscopic files.  Impression and Plan: Matthew Boyd is a 86 year old male with past medical history of advanced dementia, hypertension, hyperlipidemia, CKD, A. fib on Eliquis and prior myocardial infarction, coming to clinic for follow-up after recent hospitalization for rectal bleeding.  The patient had an episode of large  rectal bleeding for which he was hospitalized recently but did not have any significant drop in his hemoglobin and remained hemodynamically stable.  His bleeding happened in the setting of significant constipation.  I believe the episode was related to a stercoral ulcer, for which he would benefit from continuing his MiraLAX regimen as he has not presented any more episodes since he started to have more regular bowel movements.  His hemoglobin has remained stable at the rehab facility and no further intervention is warranted at this point.  The MA understood and agreed.  - Continue Miralax twice a day  All questions were answered.      Dolores Frame, MD Gastroenterology and Hepatology [facility]

## 2020-01-19 ENCOUNTER — Encounter: Payer: Self-pay | Admitting: Cardiology

## 2020-01-19 ENCOUNTER — Other Ambulatory Visit: Payer: Self-pay

## 2020-01-19 ENCOUNTER — Telehealth (INDEPENDENT_AMBULATORY_CARE_PROVIDER_SITE_OTHER): Payer: Medicare Other | Admitting: Cardiology

## 2020-01-19 VITALS — Ht 69.0 in | Wt 186.0 lb

## 2020-01-19 DIAGNOSIS — I4891 Unspecified atrial fibrillation: Secondary | ICD-10-CM

## 2020-01-19 DIAGNOSIS — I2510 Atherosclerotic heart disease of native coronary artery without angina pectoris: Secondary | ICD-10-CM | POA: Diagnosis not present

## 2020-01-19 NOTE — Progress Notes (Signed)
Virtual Visit via Telephone Note   This visit type was conducted due to national recommendations for restrictions regarding the COVID-19 Pandemic (e.g. social distancing) in an effort to limit this patient's exposure and mitigate transmission in our community.  Due to his co-morbid illnesses, this patient is at least at moderate risk for complications without adequate follow up.  This format is felt to be most appropriate for this patient at this time.  The patient did not have access to video technology/had technical difficulties with video requiring transitioning to audio format only (telephone).  All issues noted in this document were discussed and addressed.  No physical exam could be performed with this format.  Please refer to the patient's chart for his  consent to telehealth for [facility].    Date:  01/19/2020   ID:  Matthew Boyd, DOB [birth year], MRN [id number] The patient was identified using 2 identifiers.  Patient Location: Home Provider Location: Office/Clinic  PCP:  [name], MD  Cardiologist:  Dina Rich, MD  Electrophysiologist:  None   Evaluation Performed:  Follow-Up Visit  Chief Complaint:  FOllow up  History of Present Illness:    Matthew Boyd is a 86 y.o. male seen today for follow up of the following medical problems.   Due to advanced dementia history is obtained from patient's wife  1. CAD  - evidence of prior infarct from imaging (echo and MPI), never had an acute clinical event or cath.  - LVEF 60-65% by echo 11/2009   - no indciations of chest pains.   2. Paroxysmal afib  - changed to eliquis after recurrentadmissionswith supratherapeutic INR and bleeding.  -no recent palpitations - GI bleeding last month that has resolved, back on eliquis  3. HTN  - compliant with meds  4. Hyperlipidemia  - labs followed by pcp, on statin    5. CKD III - labsfollowed by pcp  6. Dementia - followed by pcp -  family reports dementia worsened.   7. Rectal bleeding - admitted 12/2019 with GI bleed - bleeding resolved, did not have invasive procedures - has resolved, back on anticoag.    The patient does not have symptoms concerning for COVID-19 infection (fever, chills, cough, or new shortness of breath).    Past Medical History:  Diagnosis Date  . Arteriosclerotic cardiovascular disease (ASCVD)    prior MI by echocardiography; stress nuclear -inferior scar; no prior cath  . Benign prostatic hypertrophy    s/p transurethral resection of the prostate  . Chronic kidney disease    Creatinine of 1.5 in 11/03 and 1.8 in 10/08; 1.75 in 09/2009  . Diabetes mellitus type 2, insulin dependent (HCC)   . Hyperlipidemia   . Hypertension   . Nephrolithiasis    surgical stone extractionx2  . Paroxysmal atrial fibrillation (HCC)  10/2009   onset in 10/2009; anticoagulation  . Tobacco abuse, in remission    25 pack years, discontinued in 1990   Past Surgical History:  Procedure Laterality Date  . Surgical removal of renal calculi     3  . TRANSURETHRAL RESECTION OF PROSTATE       Current Meds  Medication Sig  . acetaminophen (TYLENOL) 325 MG tablet Take 325 mg by mouth in the morning and at bedtime.   Marland Kitchen allopurinol (ZYLOPRIM) 100 MG tablet Take 1 tablet (100 mg total) by mouth daily.  Marland Kitchen amLODipine (NORVASC) 2.5 MG tablet Take 2.5 mg by mouth daily.  Marland Kitchen apixaban (ELIQUIS) 2.5 MG TABS tablet  Take 1 tablet (2.5 mg total) by mouth 2 (two) times daily.  Marland Kitchen donepezil (ARICEPT) 10 MG tablet Take 10 mg by mouth at bedtime.   . hydrocortisone (ANUSOL-HC) 25 MG suppository Place 1 suppository (25 mg total) rectally 2 (two) times daily.  Marland Kitchen MELATONIN PO Take 5-10 mg by mouth daily as needed (sleep).  . metoprolol (LOPRESSOR) 50 MG tablet Take 50 mg by mouth 2 (two) times daily.    . pantoprazole (PROTONIX) 40 MG tablet Take 1 tablet (40 mg total) by mouth daily.  . QUEtiapine (SEROQUEL) 25 MG tablet Take  12.5 mg by mouth 2 (two) times daily.  . simvastatin (ZOCOR) 20 MG tablet Take 20 mg by mouth daily.  Evaristo Bury FLEXTOUCH 100 UNIT/ML FlexTouch Pen Inject 22 Units into the skin daily. (Patient taking differently: Inject 30 Units into the skin daily. )     Allergies:   Patient has no known allergies.   Social History   Tobacco Use  . Smoking status: Former Smoker    Packs/day: 1.00    Years: 25.00    Pack years: 25.00    Types: Cigarettes    Start date: 08/29/1955    Quit date: 03/05/1988    Years since quitting: 31.8  . Smokeless tobacco: Never Used  . Tobacco comment: Smoked 25 years  Vaping Use  . Vaping Use: Never used  Substance Use Topics  . Alcohol use: Not Currently    Alcohol/week: 0.0 standard drinks    Comment: Excessive use in the 1990s 04/08/14- no longer drinks   . Drug use: Not Currently     Family Hx: The patient's family history includes Diabetes in his mother and sister; Heart attack in his father; Heart disease in his sister. There is no history of Colon cancer.  ROS:   Please see the history of present illness.     All other systems reviewed and are negative.   Prior CV studies:   The following studies were reviewed today:    Labs/Other Tests and Data Reviewed:    EKG:  No ECG reviewed.  Recent Labs: 12/19/2019: ALT 15; Magnesium 1.9 12/22/2019: BUN 45; Creatinine, Ser 2.01; Hemoglobin 14.1; Platelets 247; Potassium 4.0; Sodium 142   Recent Lipid Panel Lab Results  Component Value Date/Time   CHOL 145 02/13/2011 10:17 AM   TRIG 121 02/13/2011 10:17 AM   HDL 60 02/13/2011 10:17 AM   CHOLHDL 2.4 02/13/2011 10:17 AM   LDLCALC 61 02/13/2011 10:17 AM    Wt Readings from Last 3 Encounters:  01/19/20 186 lb (84.4 kg)  01/06/20 178 lb (80.7 kg)  12/19/19 190 lb 11.2 oz (86.5 kg)     Risk Assessment/Calculations:     Objective:    Vital Signs:  Ht 5\' 9"  (1.753 m)   Wt 186 lb (84.4 kg)   BMI 27.47 kg/m    Normal affect. Normal speech  pattern and tone. Comfortable, no apparent distress. No audible signs of sob or wheezing.   ASSESSMENT & PLAN:    1. CAD  - presumed CAD based on evidence of infarct by imaging, no prior clinical event  -no symptmos, continue to monitor.   2. Afib  - no symptoms, continue current meds   COVID-19 Education: The signs and symptoms of COVID-19 were discussed with the patient and how to seek care for testing (follow up with PCP or arrange E-visit).  The importance of social distancing was discussed today.  Time:   Today, I have spent 11  minutes with the patient with telehealth technology discussing the above problems.     Medication Adjustments/Labs and Tests Ordered: Current medicines are reviewed at length with the patient today.  Concerns regarding medicines are outlined above.   Tests Ordered: No orders of the defined types were placed in this encounter.   Medication Changes: No orders of the defined types were placed in this encounter.   Follow Up:  In Person in 6 month(s)  Signed, Dina Rich, MD  01/19/2020 12:56 PM    [facility]

## 2020-01-19 NOTE — Patient Instructions (Signed)
Medication Instructions:  Your physician recommends that you continue on your current medications as directed. Please refer to the Current Medication list given to you today.  *If you need a refill on your cardiac medications before your next appointment, please call your pharmacy*   Lab Work: None today If you have labs (blood work) drawn today and your tests are completely normal, you will receive your results only by: . MyChart Message (if you have MyChart) OR . A paper copy in the mail If you have any lab test that is abnormal or we need to change your treatment, we will call you to review the results.   Testing/Procedures: None today   Follow-Up: At [facility], you and your health needs are our priority.  As part of our continuing mission to provide you with exceptional heart care, we have created designated Provider Care Teams.  These Care Teams include your primary Cardiologist (physician) and Advanced Practice Providers (APPs -  Physician Assistants and Nurse Practitioners) who all work together to provide you with the care you need, when you need it.  We recommend signing up for the patient portal called "MyChart".  Sign up information is provided on this After Visit Summary.  MyChart is used to connect with patients for Virtual Visits (Telemedicine).  Patients are able to view lab/test results, encounter notes, upcoming appointments, etc.  Non-urgent messages can be sent to your provider as well.   To learn more about what you can do with MyChart, go to [email or web address].    Your next appointment:   6 month(s)  The format for your next appointment:   In Person  Provider:   Jonathan Branch, MD   Other Instructions None       Thank you for choosing [facility] !         

## 2020-02-28 ENCOUNTER — Emergency Department (HOSPITAL_COMMUNITY): Payer: Medicare Other

## 2020-02-28 ENCOUNTER — Inpatient Hospital Stay (HOSPITAL_COMMUNITY)
Admission: EM | Admit: 2020-02-28 | Discharge: 2020-03-01 | DRG: 194 | Disposition: A | Discharge status: Hospice | Payer: Medicare Other | Source: Skilled Nursing Facility | Attending: Internal Medicine | Admitting: Internal Medicine

## 2020-02-28 ENCOUNTER — Encounter (HOSPITAL_COMMUNITY): Payer: Self-pay | Admitting: Emergency Medicine

## 2020-02-28 ENCOUNTER — Other Ambulatory Visit: Payer: Self-pay

## 2020-02-28 DIAGNOSIS — N183 Chronic kidney disease, stage 3 unspecified: Secondary | ICD-10-CM | POA: Diagnosis present

## 2020-02-28 DIAGNOSIS — I10 Essential (primary) hypertension: Secondary | ICD-10-CM | POA: Diagnosis present

## 2020-02-28 DIAGNOSIS — F0281 Dementia in other diseases classified elsewhere with behavioral disturbance: Secondary | ICD-10-CM | POA: Diagnosis present

## 2020-02-28 DIAGNOSIS — Z794 Long term (current) use of insulin: Secondary | ICD-10-CM

## 2020-02-28 DIAGNOSIS — E870 Hyperosmolality and hypernatremia: Secondary | ICD-10-CM | POA: Diagnosis present

## 2020-02-28 DIAGNOSIS — I2510 Atherosclerotic heart disease of native coronary artery without angina pectoris: Secondary | ICD-10-CM | POA: Diagnosis present

## 2020-02-28 DIAGNOSIS — E43 Unspecified severe protein-calorie malnutrition: Secondary | ICD-10-CM | POA: Diagnosis present

## 2020-02-28 DIAGNOSIS — J189 Pneumonia, unspecified organism: Secondary | ICD-10-CM | POA: Diagnosis not present

## 2020-02-28 DIAGNOSIS — Z515 Encounter for palliative care: Secondary | ICD-10-CM

## 2020-02-28 DIAGNOSIS — E1122 Type 2 diabetes mellitus with diabetic chronic kidney disease: Secondary | ICD-10-CM | POA: Diagnosis present

## 2020-02-28 DIAGNOSIS — Z7901 Long term (current) use of anticoagulants: Secondary | ICD-10-CM

## 2020-02-28 DIAGNOSIS — Z66 Do not resuscitate: Secondary | ICD-10-CM | POA: Diagnosis present

## 2020-02-28 DIAGNOSIS — Z87891 Personal history of nicotine dependence: Secondary | ICD-10-CM

## 2020-02-28 DIAGNOSIS — Z833 Family history of diabetes mellitus: Secondary | ICD-10-CM

## 2020-02-28 DIAGNOSIS — G309 Alzheimer's disease, unspecified: Secondary | ICD-10-CM | POA: Diagnosis present

## 2020-02-28 DIAGNOSIS — N179 Acute kidney failure, unspecified: Secondary | ICD-10-CM | POA: Diagnosis present

## 2020-02-28 DIAGNOSIS — Z20822 Contact with and (suspected) exposure to covid-19: Secondary | ICD-10-CM | POA: Diagnosis present

## 2020-02-28 DIAGNOSIS — N1832 Chronic kidney disease, stage 3b: Secondary | ICD-10-CM | POA: Diagnosis present

## 2020-02-28 DIAGNOSIS — I252 Old myocardial infarction: Secondary | ICD-10-CM

## 2020-02-28 DIAGNOSIS — E785 Hyperlipidemia, unspecified: Secondary | ICD-10-CM | POA: Diagnosis present

## 2020-02-28 DIAGNOSIS — I480 Paroxysmal atrial fibrillation: Secondary | ICD-10-CM | POA: Diagnosis present

## 2020-02-28 DIAGNOSIS — A419 Sepsis, unspecified organism: Secondary | ICD-10-CM

## 2020-02-28 DIAGNOSIS — E119 Type 2 diabetes mellitus without complications: Secondary | ICD-10-CM

## 2020-02-28 DIAGNOSIS — L8990 Pressure ulcer of unspecified site, unspecified stage: Secondary | ICD-10-CM | POA: Insufficient documentation

## 2020-02-28 DIAGNOSIS — I129 Hypertensive chronic kidney disease with stage 1 through stage 4 chronic kidney disease, or unspecified chronic kidney disease: Secondary | ICD-10-CM | POA: Diagnosis present

## 2020-02-28 DIAGNOSIS — F02818 Dementia in other diseases classified elsewhere, unspecified severity, with other behavioral disturbance: Secondary | ICD-10-CM | POA: Diagnosis present

## 2020-02-28 DIAGNOSIS — D539 Nutritional anemia, unspecified: Secondary | ICD-10-CM | POA: Diagnosis present

## 2020-02-28 DIAGNOSIS — Z8249 Family history of ischemic heart disease and other diseases of the circulatory system: Secondary | ICD-10-CM

## 2020-02-28 DIAGNOSIS — Z79899 Other long term (current) drug therapy: Secondary | ICD-10-CM

## 2020-02-28 DIAGNOSIS — M1A9XX0 Chronic gout, unspecified, without tophus (tophi): Secondary | ICD-10-CM | POA: Diagnosis present

## 2020-02-28 DIAGNOSIS — Z9079 Acquired absence of other genital organ(s): Secondary | ICD-10-CM

## 2020-02-28 DIAGNOSIS — E860 Dehydration: Secondary | ICD-10-CM | POA: Diagnosis present

## 2020-02-28 DIAGNOSIS — E46 Unspecified protein-calorie malnutrition: Secondary | ICD-10-CM | POA: Clinically undetermined

## 2020-02-28 LAB — COMPREHENSIVE METABOLIC PANEL
ALT: 17 U/L (ref 0–44)
AST: 19 U/L (ref 15–41)
Albumin: 2.4 g/dL — ABNORMAL LOW (ref 3.5–5.0)
Alkaline Phosphatase: 78 U/L (ref 38–126)
Anion gap: 13 (ref 5–15)
BUN: 71 mg/dL — ABNORMAL HIGH (ref 8–23)
CO2: 22 mmol/L (ref 22–32)
Calcium: 8.3 mg/dL — ABNORMAL LOW (ref 8.9–10.3)
Chloride: 120 mmol/L — ABNORMAL HIGH (ref 98–111)
Creatinine, Ser: 3.04 mg/dL — ABNORMAL HIGH (ref 0.61–1.24)
GFR, Estimated: 20 mL/min — ABNORMAL LOW (ref >60)
Glucose, Bld: 349 mg/dL — ABNORMAL HIGH (ref 70–99)
Potassium: 4.2 mmol/L (ref 3.5–5.1)
Sodium: 155 mmol/L — ABNORMAL HIGH (ref 135–145)
Total Bilirubin: 0.9 mg/dL (ref 0.3–1.2)
Total Protein: 7.2 g/dL (ref 6.5–8.1)

## 2020-02-28 LAB — CBC WITH DIFFERENTIAL/PLATELET
Abs Immature Granulocytes: 0.06 10*3/uL (ref 0.00–0.07)
Basophils Absolute: 0 10*3/uL (ref 0.0–0.1)
Basophils Relative: 0 %
Eosinophils Absolute: 0 10*3/uL (ref 0.0–0.5)
Eosinophils Relative: 0 %
HCT: 37.9 % — ABNORMAL LOW (ref 39.0–52.0)
Hemoglobin: 11.5 g/dL — ABNORMAL LOW (ref 13.0–17.0)
Immature Granulocytes: 1 %
Lymphocytes Relative: 8 %
Lymphs Abs: 0.8 10*3/uL (ref 0.7–4.0)
MCH: 31 pg (ref 26.0–34.0)
MCHC: 30.3 g/dL (ref 30.0–36.0)
MCV: 102.2 fL — ABNORMAL HIGH (ref 80.0–100.0)
Monocytes Absolute: 0.3 10*3/uL (ref 0.1–1.0)
Monocytes Relative: 3 %
Neutro Abs: 9.1 10*3/uL — ABNORMAL HIGH (ref 1.7–7.7)
Neutrophils Relative %: 88 %
Platelets: 301 10*3/uL (ref 150–400)
RBC: 3.71 MIL/uL — ABNORMAL LOW (ref 4.22–5.81)
RDW: 14.4 % (ref 11.5–15.5)
WBC: 10.2 10*3/uL (ref 4.0–10.5)
nRBC: 0 % (ref 0.0–0.2)

## 2020-02-28 LAB — URINALYSIS, ROUTINE W REFLEX MICROSCOPIC
Bacteria, UA: NONE SEEN
Bilirubin Urine: NEGATIVE
Glucose, UA: 50 mg/dL — AB
Ketones, ur: 5 mg/dL — AB
Nitrite: NEGATIVE
Protein, ur: 30 mg/dL — AB
RBC / HPF: >50 RBC/hpf — ABNORMAL HIGH (ref 0–5)
Specific Gravity, Urine: 1.019 (ref 1.005–1.030)
WBC, UA: >50 WBC/hpf — ABNORMAL HIGH (ref 0–5)
pH: 5 (ref 5.0–8.0)

## 2020-02-28 LAB — LACTIC ACID, PLASMA: Lactic Acid, Venous: 1.7 mmol/L (ref 0.5–1.9)

## 2020-02-28 LAB — APTT: aPTT: 38 seconds — ABNORMAL HIGH (ref 24–36)

## 2020-02-28 LAB — PROTIME-INR
INR: 1.7 — ABNORMAL HIGH (ref 0.8–1.2)
Prothrombin Time: 19.6 seconds — ABNORMAL HIGH (ref 11.4–15.2)

## 2020-02-28 MED ORDER — SODIUM CHLORIDE 0.9 % IV SOLN
2.0000 g | Freq: Once | INTRAVENOUS | Status: AC
  Administered 2020-02-28: 22:00:00 2 g via INTRAVENOUS
  Filled 2020-02-28: qty 20

## 2020-02-28 MED ORDER — FENTANYL CITRATE (PF) 100 MCG/2ML IJ SOLN
50.0000 ug | Freq: Once | INTRAMUSCULAR | Status: AC
  Administered 2020-02-28: 23:00:00 50 ug via INTRAVENOUS
  Filled 2020-02-28: qty 2

## 2020-02-28 MED ORDER — LACTATED RINGERS IV BOLUS
1500.0000 mL | Freq: Once | INTRAVENOUS | Status: AC
  Administered 2020-02-28: 22:00:00 1000 mL via INTRAVENOUS

## 2020-02-28 NOTE — ED Triage Notes (Signed)
Pt arrives via [facility] from [facility]. Per staff pt has been rapidly declining over the past few weeks. Today wife made pt a DNR. Per facility pt has been having periods of apnea, tachycardia, hypotension.  Facility had blood work, cultures, UA, and CXR. Awaiting results.

## 2020-02-28 NOTE — ED Provider Notes (Signed)
AP-EMERGENCY DEPT [facility] Emergency Department Provider Note MRN:  [id number]  Arrival date & time: 02/28/20     Chief Complaint   Fever   History of Present Illness   Matthew Boyd is a 86 y.o. year-old male with a history of CAD, CKD, diabetes, hypertension, A. fib presenting to the ED with chief complaint of fever.  Significant decline in clinical status over the past few weeks reported by care facility.  Today was not responding, having apneic periods, febrile.  Facility concerned he was going through the dying process.  Patient's wife wanted patient evaluated at the hospital.  I was unable to obtain an accurate HPI, PMH, or ROS due to the patient's altered mental status.  Level 5 caveat.  Review of Systems  Positive for fever, unresponsiveness, altered mental status  Patient's Health History    Past Medical History:  Diagnosis Date  . Arteriosclerotic cardiovascular disease (ASCVD)    prior MI by echocardiography; stress nuclear -inferior scar; no prior cath  . Benign prostatic hypertrophy    s/p transurethral resection of the prostate  . Chronic kidney disease    Creatinine of 1.5 in 11/03 and 1.8 in 10/08; 1.75 in 09/2009  . Diabetes mellitus type 2, insulin dependent (HCC)   . Hyperlipidemia   . Hypertension   . Nephrolithiasis    surgical stone extractionx2  . Paroxysmal atrial fibrillation (HCC)  10/2009   onset in 10/2009; anticoagulation  . Tobacco abuse, in remission    25 pack years, discontinued in 1990    Past Surgical History:  Procedure Laterality Date  . Surgical removal of renal calculi     3  . TRANSURETHRAL RESECTION OF PROSTATE      Family History  Problem Relation Age of Onset  . Diabetes Mother   . Heart attack Father   . Heart disease Sister   . Diabetes Sister   . Colon cancer Neg Hx     Social History   Socioeconomic History  . Marital status: Married    Spouse name: Not on file  . Number of children: Not on file  .  Years of education: Not on file  . Highest education level: Not on file  Occupational History  . Occupation: Retired  Tobacco Use  . Smoking status: Former Smoker    Packs/day: 1.00    Years: 25.00    Pack years: 25.00    Types: Cigarettes    Start date: 08/29/1955    Quit date: 03/05/1988    Years since quitting: 32.0  . Smokeless tobacco: Never Used  . Tobacco comment: Smoked 25 years  Vaping Use  . Vaping Use: Never used  Substance and Sexual Activity  . Alcohol use: Not Currently    Alcohol/week: 0.0 standard drinks    Comment: Excessive use in the 1990s 04/08/14- no longer drinks   . Drug use: Not Currently  . Sexual activity: Not Currently  Other Topics Concern  . Not on file  Social History Narrative   Married with 2 adult children   Social Determinants of Health   Financial Resource Strain: Not on file  Food Insecurity: Not on file  Transportation Needs: Not on file  Physical Activity: Not on file  Stress: Not on file  Social Connections: Not on file  Intimate Partner Violence: Not on file     Physical Exam   Vitals:   02/28/20 2130 02/28/20 2200  BP: 118/75 108/68  Pulse: (!) 116 (!) 101  Resp: Marland Kitchen)  32 (!) 27  Temp:    SpO2: 98% 98%    CONSTITUTIONAL: Ill-appearing NEURO: Responds incoherently to painful stimulus, moves all extremities EYES:  eyes equal and reactive ENT/NECK:  no LAD, no JVD CARDIO: Tachycardic rate, well-perfused, normal S1 and S2 PULM:  CTAB no wheezing or rhonchi GI/GU:  normal bowel sounds, non-distended, mild diffuse tenderness MSK/SPINE:  No gross deformities, no edema SKIN:  no rash, atraumatic PSYCH: Unable to assess  *Additional and/or pertinent findings included in MDM below  Diagnostic and Interventional Summary    EKG Interpretation  Date/Time:  Sunday February 28 2020 21:04:38 EST Ventricular Rate:  118 PR Interval:    QRS Duration: 78 QT Interval:  380 QTC Calculation: 503 R Axis:   60 Text  Interpretation: Atrial fibrillation Paired ventricular premature complexes Aberrant conduction of SV complex(es) Anteroseptal infarct, age indeterminate ST elevation, consider inferior injury Prolonged QT interval Confirmed by Kennis Carina [phone]) on 02/28/2020 9:12:25 PM      Labs Reviewed  COMPREHENSIVE METABOLIC PANEL - Abnormal; Notable for the following components:      Result Value   Sodium 155 (*)    Chloride 120 (*)    Glucose, Bld 349 (*)    BUN 71 (*)    Creatinine, Ser 3.04 (*)    Calcium 8.3 (*)    Albumin 2.4 (*)    GFR, Estimated 20 (*)    All other components within normal limits  CBC WITH DIFFERENTIAL/PLATELET - Abnormal; Notable for the following components:   RBC 3.71 (*)    Hemoglobin 11.5 (*)    HCT 37.9 (*)    MCV 102.2 (*)    Neutro Abs 9.1 (*)    All other components within normal limits  PROTIME-INR - Abnormal; Notable for the following components:   Prothrombin Time 19.6 (*)    INR 1.7 (*)    All other components within normal limits  APTT - Abnormal; Notable for the following components:   aPTT 38 (*)    All other components within normal limits  CULTURE, BLOOD (ROUTINE X 2)  CULTURE, BLOOD (ROUTINE X 2)  URINE CULTURE  LACTIC ACID, PLASMA  LACTIC ACID, PLASMA  URINALYSIS, ROUTINE W REFLEX MICROSCOPIC    DG Chest Port 1 View  Final Result    CT ABDOMEN PELVIS WO CONTRAST    (Results Pending)    Medications  lactated ringers bolus 1,500 mL (1,000 mLs Intravenous New Bag/Given 02/28/20 2139)  cefTRIAXone (ROCEPHIN) 2 g in sodium chloride 0.9 % 100 mL IVPB (2 g Intravenous New Bag/Given 02/28/20 2139)  fentaNYL (SUBLIMAZE) injection 50 mcg (50 mcg Intravenous Given 02/28/20 2231)     Procedures  /  Critical Care .Critical Care Performed by: Sabas Sous, MD Authorized by: Sabas Sous, MD   Critical care provider statement:    Critical care time (minutes):  45   Critical care was necessary to treat or prevent imminent or  life-threatening deterioration of the following conditions:  Sepsis   Critical care was time spent personally by me on the following activities:  Discussions with consultants, evaluation of patient's response to treatment, examination of patient, ordering and performing treatments and interventions, ordering and review of laboratory studies, ordering and review of radiographic studies, pulse oximetry, re-evaluation of patient's condition, obtaining history from patient or surrogate and review of old charts    ED Course and Medical Decision Making  I have reviewed the triage vital signs, the nursing notes, and pertinent available records from  the EMR.  Listed above are laboratory and imaging tests that I personally ordered, reviewed, and interpreted and then considered in my medical decision making (see below for details).  Concern for sepsis, unclear source.  Concern for significant decline recently and possibility of dying process.  Patient is febrile, tachycardic.  I was able to speak with the wife and we discussed options.  We went over the option of comfort care at length.  She had not considered this option prior to today.  She is not ready to make this decision.  I have confirmed that patient is DNR/DNI, but otherwise she would like everything done code sepsis initiated.  Labs reveal significant hypernatremia, AKI, will need admission.  Signed out to oncoming provider at shift change.       Elmer Sow. Pilar Plate, MD [facility] Emergency Medicine [facility] [email or web address]  Final Clinical Impressions(s) / ED Diagnoses     ICD-10-CM   1. Sepsis, due to unspecified organism, unspecified whether acute organ dysfunction present [facility])  A41.9     ED Discharge Orders    None       Discharge Instructions Discussed with and Provided to Patient:   Discharge Instructions   None       Sabas Sous, MD 02/28/20 2240

## 2020-02-28 NOTE — H&P (Signed)
History and Physical    Matthew Boyd HWK:[id number] DOB: June 06, 1933 DOA: 02/28/2020  PCP: [name], MD  Patient coming from: [facility].  I have personally briefly reviewed patient's old medical records in [facility]  Chief Complaint: AMS.  HPI: Matthew Boyd is a 86 y.o. male with medical history significant of CAD, history of STEMI, BPH, stage III chronic kidney disease, insulin requiring type II DM, hyperlipidemia, hypertension, nephrolithiasis, paroxysmal intrafibrillation, tobacco use, advanced dementia who is sent from [facility] due to altered mental status that has been declining for the past few days. The patient is unable to provide further history due to current mental status.  ED Course: Initial vital signs were temperature 100.3 F, pulse 114, respirations 25, BP 110/74 mmHg O2 sat 98% on 2 LPM via [place]. The patient received 1500 mL of LR bolus, 50 mcg of fentanyl IVP, 2 g of ceftriaxone and 500 mg of azithromycin IVPB.  I added magnesium supplementation due to prolonged QT interval and morphine 2 mg IVP.  Labwork: His urinalysis was from catheterized urine and showed glucosuria 50, ketonuria 5 and proteinuria of 30 mg/dL.  There was moderate hemoglobinuria and trace leukocyte esterase.  Microscopic examination had more than 50 RBC and more than 50 WBC per hpf.  There were no bacteria seen.  CBC shows a white count of 10.2, hemoglobin 11.5 g/dL with an MCV of 945.8 and platelets 301. PT 19.6, INR 1.7 and PTT 38.  CMP showed a sodium of 155, potassium 4.2, chloride 120 and CO2 22 mmol/L.  Calcium 8.3, glucose 349, BUN 71 and creatinine 3.04 mg/dL.  LFTs were unremarkable, except for an albumin of 2.4 g/dL.  Lactic acid was normal.  Procalcitonin 2.76 ng/mL.  Imaging: A 1 view chest radiograph did not show any active disease. CT abdomen/pelvis without contrast was significant for left lower lobe airspace disease concerning for pneumonia. Multiple nonobstructing  right-sided nephrolithiasis. There was no acute or focal lesion to explain the patient abdominal pain. Please see images and full radiology report for further detail.  Review of Systems: As per HPI otherwise all other systems reviewed and are negative.  Past Medical History:  Diagnosis Date  . Arteriosclerotic cardiovascular disease (ASCVD)    prior MI by echocardiography; stress nuclear -inferior scar; no prior cath  . Benign prostatic hypertrophy    s/p transurethral resection of the prostate  . Chronic kidney disease    Creatinine of 1.5 in 11/03 and 1.8 in 10/08; 1.75 in 09/2009  . Diabetes mellitus type 2, insulin dependent (HCC)   . Hyperlipidemia   . Hypertension   . Nephrolithiasis    surgical stone extractionx2  . Paroxysmal atrial fibrillation (HCC)  10/2009   onset in 10/2009; anticoagulation  . Tobacco abuse, in remission    25 pack years, discontinued in 1990    Past Surgical History:  Procedure Laterality Date  . Surgical removal of renal calculi     3  . TRANSURETHRAL RESECTION OF PROSTATE      Social History  reports that he quit smoking about 32 years ago. His smoking use included cigarettes. He started smoking about 64 years ago. He has a 25.00 pack-year smoking history. He has never used smokeless tobacco. He reports previous alcohol use. He reports previous drug use.  No Known Allergies  Family History  Problem Relation Age of Onset  . Diabetes Mother   . Heart attack Father   . Heart disease Sister   . Diabetes  Sister   . Colon cancer Neg Hx    Prior to Admission medications   Medication Sig Start Date End Date Taking? Authorizing Provider  acetaminophen (TYLENOL) 325 MG tablet Take 325 mg by mouth in the morning and at bedtime.     [provider]  allopurinol (ZYLOPRIM) 100 MG tablet Take 1 tablet (100 mg total) by mouth daily. 12/22/19 12/21/20  Vassie Loll, MD  amLODipine (NORVASC) 2.5 MG tablet Take 2.5 mg by mouth daily.    [provider]  apixaban (ELIQUIS) 2.5 MG TABS tablet Take 1 tablet (2.5 mg total) by mouth 2 (two) times daily. 05/30/15   Erick Blinks, MD  donepezil (ARICEPT) 10 MG tablet Take 10 mg by mouth at bedtime.  08/12/18   [provider]  hydrocortisone (ANUSOL-HC) 25 MG suppository Place 1 suppository (25 mg total) rectally 2 (two) times daily. 12/17/19   Erick Blinks, MD  MELATONIN PO Take 5-10 mg by mouth daily as needed (sleep).    [provider]  metoprolol (LOPRESSOR) 50 MG tablet Take 50 mg by mouth 2 (two) times daily.      [provider]  pantoprazole (PROTONIX) 40 MG tablet Take 1 tablet (40 mg total) by mouth daily. 12/23/19   Vassie Loll, MD  QUEtiapine (SEROQUEL) 25 MG tablet Take 12.5 mg by mouth 2 (two) times daily. 02/04/19   [provider]  simvastatin (ZOCOR) 20 MG tablet Take 20 mg by mouth daily. 01/25/15   [provider]  TRESIBA FLEXTOUCH 100 UNIT/ML FlexTouch Pen Inject 22 Units into the skin daily. Patient taking differently: Inject 30 Units into the skin daily.  12/22/19   Vassie Loll, MD    Physical Exam: Vitals:   02/28/20 2200 02/28/20 2230 02/28/20 2300 02/28/20 2330  BP: 108/68 119/70 108/64 103/70  Pulse: (!) 101 (!) 116 83 (!) 103  Resp: (!) 27 (!) 37 (!) 29 20  Temp:      TempSrc:      SpO2: 98% 97% 95% 100%  Weight:      Height:       Constitutional: Looks chronically ill. Eyes: PERRL, lids and conjunctivae normal ENMT: Mucous membranes and lips are very dry. Posterior pharynx clear of any exudate or lesions. Neck: normal, supple, no masses, no thyromegaly Respiratory: Tachypneic in the mid 20s.  Mild bilateral rhonchi with LLL crackles.  No accessory muscle use.  Cardiovascular: Irregularly irregular in the 80s and 90s, no murmurs / rubs / gallops. No extremity edema. 2+ pedal pulses. No carotid bruits.  Abdomen: No distention.  Bowel sounds positive.  Soft, no tenderness, no masses palpated. No  hepatosplenomegaly. Musculoskeletal: no clubbing / cyanosis. Good ROM, no contractures. Normal muscle tone.  Skin: Decreased skin turgor.  Pretibial area skin is peeling bilaterally.  Unable to fully evaluate. Neurologic: Obtunded.  No gross focal deficits. Psychiatric: Obtunded.  Labs on Admission: I have personally reviewed following labs and imaging studies  CBC: Recent Labs  Lab 02/28/20 2110  WBC 10.2  NEUTROABS 9.1*  HGB 11.5*  HCT 37.9*  MCV 102.2*  PLT 301   Basic Metabolic Panel: Recent Labs  Lab 02/28/20 2110  NA 155*  K 4.2  CL 120*  CO2 22  GLUCOSE 349*  BUN 71*  CREATININE 3.04*  CALCIUM 8.3*   GFR: Estimated Creatinine Clearance: 18.1 mL/min (A) (by C-G formula based on SCr of 3.04 mg/dL (H)).  Liver Function Tests: Recent Labs  Lab 02/28/20 2110  AST 19  ALT 17  ALKPHOS 78  BILITOT 0.9  PROT 7.2  ALBUMIN 2.4*   Urine analysis:    Component Value Date/Time   COLORURINE YELLOW 02/28/2020 2310   APPEARANCEUR HAZY (A) 02/28/2020 2310   LABSPEC 1.019 02/28/2020 2310   PHURINE 5.0 02/28/2020 2310   GLUCOSEU 50 (A) 02/28/2020 2310   HGBUR MODERATE (A) 02/28/2020 2310   BILIRUBINUR NEGATIVE 02/28/2020 2310   KETONESUR 5 (A) 02/28/2020 2310   PROTEINUR 30 (A) 02/28/2020 2310   NITRITE NEGATIVE 02/28/2020 2310   LEUKOCYTESUR TRACE (A) 02/28/2020 2310   Radiological Exams on Admission: CT ABDOMEN PELVIS WO CONTRAST  Result Date: 02/28/2020 CLINICAL DATA:  Abdominal pain, acute, nonlocalized. Question intra-abdominal infection. EXAM: CT ABDOMEN AND PELVIS WITHOUT CONTRAST TECHNIQUE: Multidetector CT imaging of the abdomen and pelvis was performed following the standard protocol without IV contrast. COMPARISON:  CT abdomen and pelvis 05/25/2015 FINDINGS: Lower chest: Focal airspace consolidation is present at the left base. Minimal atelectasis present at the right base. Heart is mildly enlarged. Coronary artery calcifications are evident. No  significant pleural or pericardial effusion is present. Hepatobiliary: Gallbladder is distended. Noncontrast liver is within normal limits. Common bile duct is normal. Pancreas: Unremarkable. No pancreatic ductal dilatation or surrounding inflammatory changes. Spleen: Normal in size without focal abnormality. Adrenals/Urinary Tract: Adrenal glands are normal bilaterally. Kidneys are atrophic. Multiple right-sided stones are new. Least 5 separate stones present. The largest measures 9 mm. No obstruction is present. Central sinus cyst is present in the lower pole of the left kidney, measuring up to 2.5 cm. The ureters are within normal limits bilaterally. The urinary bladder is unremarkable. Stomach/Bowel: Stomach and duodenum are within normal limits. Small bowel is unremarkable. Terminal ileum is within normal limits. Appendix is visualized and normal. Ascending transverse colon are within normal limits. Descending and sigmoid colon are unremarkable. Vascular/Lymphatic: Atherosclerotic calcifications are present in the aorta branch vessels without aneurysm or significant change. No significant adenopathy is present. Reproductive: Prostate is unremarkable. Other: Paraumbilical hernia contains fat without bowel. No other significant ventral hernia is present. No free fluid or free air is present. Musculoskeletal: Bilateral pars defects are present. Associated slight retrolisthesis at L4-5 and grade 1 anterolisthesis at L5-S1 is stable. Slight retrolisthesis at L2-3 is also stable. No focal lytic or blastic lesions are present. SI joints are fused. Hips are located and within normal limits. IMPRESSION: 1. Left lower lobe airspace disease concerning for pneumonia. 2. Multiple nonobstructing right-sided nephrolithiasis. 3. No other acute or focal lesion to explain the patient's abdominal pain. 4. Coronary artery disease. 5. 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 6.  Aortic Atherosclerosis (ICD10-I70.0). Electronically Signed   By: Marin Robertshristopher  Mattern M.D.   On: 02/28/2020 23:17   DG Chest Port 1 View  Result Date: 02/28/2020 CLINICAL DATA:  Apnea with tachycardia and hypotension. EXAM: PORTABLE CHEST 1 VIEW COMPARISON:  December 19, 2019 FINDINGS: Mild, diffuse chronic appearing increased interstitial lung markings are seen. There is no evidence of acute infiltrate, pleural effusion or pneumothorax. The heart size and mediastinal contours are within normal limits. Degenerative changes seen throughout the thoracic spine. IMPRESSION: No active disease. Electronically Signed   By: Aram Candelahaddeus  Houston M.D.   On: 02/28/2020 21:39    EKG: Independently reviewed.  Vent. rate 118 BPM PR interval * ms QRS duration 78 ms QT/QTc 380/503 ms P-R-T axes * 60 49 Atrial fibrillation Paired ventricular premature complexes Aberrant conduction of SV complex(es)  Anteroseptal infarct, age indeterminate ST elevation, consider inferior injury Prolonged QT interval  Assessment/Plan Principal Problem:   Hypernatremia Calculated free water deficit 4.5 L. Admit to telemetry/inpatient. Continue NaCl 0.45% 150 mL/h. Monitor intake and output. Follow-up sodium level.  Active Problems:   CAP (community acquired pneumonia) Supplemental oxygen as needed. Bronchodilators as needed. Continue azithromycin 500 mg IVPB every 24 hours Continue ceftriaxone 2 g IVPB every 24 hours. Check strep pneumoniae urinary antigen. Follow-up blood culture and sensitivity.    Acute renal failure superimposed on stage 3 chronic kidney disease (HCC) Continue IV hydration. Monitor intake and output. Monitor renal function electrolytes.    Paroxysmal atrial fibrillation (HCC) CHA?DS?-VASc Score Matthew Boyd 5. Continue apixaban 2.5 mg p.o. daily per Continue metoprolol 50 mg p.o. twice daily.    Hypertension On metoprolol and amlodipine. IV antihypertensives while obtunded.     Hyperlipidemia On simvastatin 20 mg p.o. daily.    Diabetes mellitus type 2, insulin dependent (HCC) CBG monitoring R ISS.    Protein-calorie malnutrition, severe Consult nutritional services.    Alzheimer's dementia with behavioral disturbance (HCC) Resume Seroquel and donezepil once more alert.    Chronic gout without tophus On allopurinol.    Macrocytic anemia Check anemia panel.    DVT prophylaxis: On apixaban. Code Status:   Full code. Family Communication:   Disposition Plan:   Patient is from:  Home.  Anticipated DC to:  Home.  Anticipated DC date:  03/02/2020.  Anticipated DC barriers: Clinical condition.  Consults called:   Admission status:  Observation/telemetry.  Severity of Illness:  Very high due to hypernatremia, CAP, acute on chronic renal failure with history of diabetes and current severe protein calorie malnutrition.  Bobette Mo MD [facility]  How to contact the [facility] Attending or Consulting provider 7A - 7P or covering provider during after hours 7P -7A, for this patient?   1. Check the care team in [facility] and look for a) attending/consulting [facility] provider listed and b) the [facility] team listed 2. Log into [email or web address] and use [place]'s universal password to access. If you do not have the password, please contact the hospital operator. 3. Locate the [facility] provider you are looking for under [facility] and page to a number that you can be directly reached. 4. If you still have difficulty reaching the provider, please page the [facility] (Director on Call) for the Hospitalists listed on amion for assistance.  02/28/2020, 11:51 PM   This document was prepared using Dragon voice recognition software and may contain some unintended transcription errors.

## 2020-02-28 NOTE — ED Notes (Signed)
ED Provider at bedside. 

## 2020-02-28 NOTE — ED Notes (Signed)
Patient transported to CT 

## 2020-02-29 ENCOUNTER — Encounter (HOSPITAL_COMMUNITY): Payer: Self-pay | Admitting: Internal Medicine

## 2020-02-29 ENCOUNTER — Other Ambulatory Visit: Payer: Self-pay

## 2020-02-29 DIAGNOSIS — F0281 Dementia in other diseases classified elsewhere with behavioral disturbance: Secondary | ICD-10-CM | POA: Diagnosis present

## 2020-02-29 DIAGNOSIS — E870 Hyperosmolality and hypernatremia: Secondary | ICD-10-CM

## 2020-02-29 DIAGNOSIS — I2510 Atherosclerotic heart disease of native coronary artery without angina pectoris: Secondary | ICD-10-CM | POA: Diagnosis present

## 2020-02-29 DIAGNOSIS — N183 Chronic kidney disease, stage 3 unspecified: Secondary | ICD-10-CM

## 2020-02-29 DIAGNOSIS — E860 Dehydration: Secondary | ICD-10-CM | POA: Diagnosis present

## 2020-02-29 DIAGNOSIS — Z87891 Personal history of nicotine dependence: Secondary | ICD-10-CM | POA: Diagnosis not present

## 2020-02-29 DIAGNOSIS — E1122 Type 2 diabetes mellitus with diabetic chronic kidney disease: Secondary | ICD-10-CM | POA: Diagnosis present

## 2020-02-29 DIAGNOSIS — I480 Paroxysmal atrial fibrillation: Secondary | ICD-10-CM

## 2020-02-29 DIAGNOSIS — Z9079 Acquired absence of other genital organ(s): Secondary | ICD-10-CM | POA: Diagnosis not present

## 2020-02-29 DIAGNOSIS — J189 Pneumonia, unspecified organism: Secondary | ICD-10-CM | POA: Diagnosis present

## 2020-02-29 DIAGNOSIS — Z833 Family history of diabetes mellitus: Secondary | ICD-10-CM | POA: Diagnosis not present

## 2020-02-29 DIAGNOSIS — Z8249 Family history of ischemic heart disease and other diseases of the circulatory system: Secondary | ICD-10-CM | POA: Diagnosis not present

## 2020-02-29 DIAGNOSIS — G309 Alzheimer's disease, unspecified: Secondary | ICD-10-CM | POA: Diagnosis present

## 2020-02-29 DIAGNOSIS — N179 Acute kidney failure, unspecified: Secondary | ICD-10-CM | POA: Diagnosis present

## 2020-02-29 DIAGNOSIS — M1A9XX0 Chronic gout, unspecified, without tophus (tophi): Secondary | ICD-10-CM | POA: Diagnosis present

## 2020-02-29 DIAGNOSIS — Z515 Encounter for palliative care: Secondary | ICD-10-CM | POA: Diagnosis not present

## 2020-02-29 DIAGNOSIS — E119 Type 2 diabetes mellitus without complications: Secondary | ICD-10-CM | POA: Diagnosis not present

## 2020-02-29 DIAGNOSIS — I252 Old myocardial infarction: Secondary | ICD-10-CM | POA: Diagnosis not present

## 2020-02-29 DIAGNOSIS — D539 Nutritional anemia, unspecified: Secondary | ICD-10-CM | POA: Diagnosis present

## 2020-02-29 DIAGNOSIS — E46 Unspecified protein-calorie malnutrition: Secondary | ICD-10-CM | POA: Diagnosis not present

## 2020-02-29 DIAGNOSIS — E785 Hyperlipidemia, unspecified: Secondary | ICD-10-CM | POA: Diagnosis present

## 2020-02-29 DIAGNOSIS — Z66 Do not resuscitate: Secondary | ICD-10-CM | POA: Diagnosis present

## 2020-02-29 DIAGNOSIS — I129 Hypertensive chronic kidney disease with stage 1 through stage 4 chronic kidney disease, or unspecified chronic kidney disease: Secondary | ICD-10-CM | POA: Diagnosis present

## 2020-02-29 DIAGNOSIS — Z20822 Contact with and (suspected) exposure to covid-19: Secondary | ICD-10-CM | POA: Diagnosis present

## 2020-02-29 DIAGNOSIS — N1832 Chronic kidney disease, stage 3b: Secondary | ICD-10-CM | POA: Diagnosis present

## 2020-02-29 DIAGNOSIS — Z7901 Long term (current) use of anticoagulants: Secondary | ICD-10-CM | POA: Diagnosis not present

## 2020-02-29 LAB — IRON AND TIBC
Iron: 33 ug/dL — ABNORMAL LOW (ref 45–182)
Saturation Ratios: 27 % (ref 17.9–39.5)
TIBC: 122 ug/dL — ABNORMAL LOW (ref 250–450)
UIBC: 89 ug/dL

## 2020-02-29 LAB — CBC WITH DIFFERENTIAL/PLATELET
Abs Immature Granulocytes: 0.07 10*3/uL (ref 0.00–0.07)
Basophils Absolute: 0 10*3/uL (ref 0.0–0.1)
Basophils Relative: 0 %
Eosinophils Absolute: 0 10*3/uL (ref 0.0–0.5)
Eosinophils Relative: 0 %
HCT: 41.6 % (ref 39.0–52.0)
Hemoglobin: 12.1 g/dL — ABNORMAL LOW (ref 13.0–17.0)
Immature Granulocytes: 1 %
Lymphocytes Relative: 13 %
Lymphs Abs: 1.3 10*3/uL (ref 0.7–4.0)
MCH: 30.6 pg (ref 26.0–34.0)
MCHC: 29.1 g/dL — ABNORMAL LOW (ref 30.0–36.0)
MCV: 105.1 fL — ABNORMAL HIGH (ref 80.0–100.0)
Monocytes Absolute: 0.3 10*3/uL (ref 0.1–1.0)
Monocytes Relative: 3 %
Neutro Abs: 8 10*3/uL — ABNORMAL HIGH (ref 1.7–7.7)
Neutrophils Relative %: 83 %
Platelets: 299 10*3/uL (ref 150–400)
RBC: 3.96 MIL/uL — ABNORMAL LOW (ref 4.22–5.81)
RDW: 14.4 % (ref 11.5–15.5)
WBC: 9.6 10*3/uL (ref 4.0–10.5)
nRBC: 0 % (ref 0.0–0.2)

## 2020-02-29 LAB — COMPREHENSIVE METABOLIC PANEL
ALT: 17 U/L (ref 0–44)
AST: 21 U/L (ref 15–41)
Albumin: 2.4 g/dL — ABNORMAL LOW (ref 3.5–5.0)
Alkaline Phosphatase: 76 U/L (ref 38–126)
Anion gap: 12 (ref 5–15)
BUN: 66 mg/dL — ABNORMAL HIGH (ref 8–23)
CO2: 24 mmol/L (ref 22–32)
Calcium: 8.3 mg/dL — ABNORMAL LOW (ref 8.9–10.3)
Chloride: 117 mmol/L — ABNORMAL HIGH (ref 98–111)
Creatinine, Ser: 2.58 mg/dL — ABNORMAL HIGH (ref 0.61–1.24)
GFR, Estimated: 24 mL/min — ABNORMAL LOW (ref >60)
Glucose, Bld: 368 mg/dL — ABNORMAL HIGH (ref 70–99)
Potassium: 4.6 mmol/L (ref 3.5–5.1)
Sodium: 153 mmol/L — ABNORMAL HIGH (ref 135–145)
Total Bilirubin: 0.6 mg/dL (ref 0.3–1.2)
Total Protein: 7.1 g/dL (ref 6.5–8.1)

## 2020-02-29 LAB — RETICULOCYTES
Immature Retic Fract: 7.7 % (ref 2.3–15.9)
RBC.: 3.89 MIL/uL — ABNORMAL LOW (ref 4.22–5.81)
Retic Count, Absolute: 30 10*3/uL (ref 19.0–186.0)
Retic Ct Pct: 0.8 % (ref 0.4–3.1)

## 2020-02-29 LAB — FERRITIN: Ferritin: 1337 ng/mL — ABNORMAL HIGH (ref 24–336)

## 2020-02-29 LAB — CBG MONITORING, ED
Glucose-Capillary: 347 mg/dL — ABNORMAL HIGH (ref 70–99)
Glucose-Capillary: 214 mg/dL — ABNORMAL HIGH (ref 70–99)
Glucose-Capillary: 221 mg/dL — ABNORMAL HIGH (ref 70–99)

## 2020-02-29 LAB — RESP PANEL BY RT-PCR (FLU A&B, COVID) ARPGX2
Influenza A by PCR: NEGATIVE
Influenza B by PCR: NEGATIVE
SARS Coronavirus 2 by RT PCR: NEGATIVE

## 2020-02-29 LAB — VITAMIN B12: Vitamin B-12: 562 pg/mL (ref 180–914)

## 2020-02-29 LAB — PROCALCITONIN: Procalcitonin: 2.76 ng/mL

## 2020-02-29 LAB — STREP PNEUMONIAE URINARY ANTIGEN: Strep Pneumo Urinary Antigen: NEGATIVE

## 2020-02-29 LAB — FOLATE: Folate: 6.9 ng/mL (ref >5.9)

## 2020-02-29 MED ORDER — HALOPERIDOL 0.5 MG PO TABS: 0.5000 mg | ORAL_TABLET | ORAL | Status: DC | PRN

## 2020-02-29 MED ORDER — ALBUTEROL SULFATE (2.5 MG/3ML) 0.083% IN NEBU: 2.5000 mg | INHALATION_SOLUTION | RESPIRATORY_TRACT | Status: DC | PRN

## 2020-02-29 MED ORDER — HALOPERIDOL LACTATE 5 MG/ML IJ SOLN: 0.5000 mg | INTRAMUSCULAR | Status: DC | PRN

## 2020-02-29 MED ORDER — ONDANSETRON HCL 4 MG/2ML IJ SOLN: 4.0000 mg | Freq: Four times a day (QID) | INTRAMUSCULAR | Status: DC | PRN

## 2020-02-29 MED ORDER — INSULIN ASPART 100 UNIT/ML ~~LOC~~ SOLN
0.0000 [IU] | Freq: Three times a day (TID) | SUBCUTANEOUS | Status: DC
  Administered 2020-02-29: 12:00:00 3 [IU] via SUBCUTANEOUS
  Administered 2020-02-29: 08:00:00 7 [IU] via SUBCUTANEOUS
  Administered 2020-02-29: 16:00:00 3 [IU] via SUBCUTANEOUS
  Filled 2020-02-29: qty 1

## 2020-02-29 MED ORDER — HALOPERIDOL LACTATE 2 MG/ML PO CONC: 0.5000 mg | ORAL | Status: DC | PRN

## 2020-02-29 MED ORDER — POLYVINYL ALCOHOL 1.4 % OP SOLN: 1.0000 [drp] | Freq: Four times a day (QID) | OPHTHALMIC | Status: DC | PRN

## 2020-02-29 MED ORDER — MORPHINE SULFATE (PF) 2 MG/ML IV SOLN
2.0000 mg | Freq: Once | INTRAVENOUS | Status: AC
  Administered 2020-02-29: 02:00:00 2 mg via INTRAVENOUS
  Filled 2020-02-29: qty 1

## 2020-02-29 MED ORDER — ONDANSETRON 4 MG PO TBDP: 4.0000 mg | ORAL_TABLET | Freq: Four times a day (QID) | ORAL | Status: DC | PRN

## 2020-02-29 MED ORDER — GLYCOPYRROLATE 1 MG PO TABS: 1.0000 mg | ORAL_TABLET | ORAL | Status: DC | PRN

## 2020-02-29 MED ORDER — MAGNESIUM SULFATE IN D5W 1-5 GM/100ML-% IV SOLN
1.0000 g | Freq: Once | INTRAVENOUS | Status: AC
  Administered 2020-02-29: 02:00:00 1 g via INTRAVENOUS
  Filled 2020-02-29: qty 100

## 2020-02-29 MED ORDER — ACETAMINOPHEN 650 MG RE SUPP: 650.0000 mg | Freq: Four times a day (QID) | RECTAL | Status: DC | PRN

## 2020-02-29 MED ORDER — LORAZEPAM 2 MG/ML PO CONC: 1.0000 mg | ORAL | Status: DC | PRN

## 2020-02-29 MED ORDER — LORAZEPAM 1 MG PO TABS: 1.0000 mg | ORAL_TABLET | ORAL | Status: DC | PRN

## 2020-02-29 MED ORDER — LORAZEPAM 2 MG/ML IJ SOLN
1.0000 mg | INTRAMUSCULAR | Status: DC | PRN
  Administered 2020-02-29 – 2020-03-01 (×3): 1 mg via INTRAVENOUS
  Filled 2020-02-29 (×3): qty 1

## 2020-02-29 MED ORDER — GLYCOPYRROLATE 0.2 MG/ML IJ SOLN: 0.2000 mg | INTRAMUSCULAR | Status: DC | PRN

## 2020-02-29 MED ORDER — ACETAMINOPHEN 325 MG PO TABS: 650.0000 mg | ORAL_TABLET | Freq: Four times a day (QID) | ORAL | Status: DC | PRN

## 2020-02-29 MED ORDER — SODIUM CHLORIDE 0.9 % IV SOLN: 2.0000 g | INTRAVENOUS | Status: DC

## 2020-02-29 MED ORDER — BIOTENE DRY MOUTH MT LIQD: 15.0000 mL | OROMUCOSAL | Status: DC | PRN

## 2020-02-29 MED ORDER — MORPHINE SULFATE (PF) 2 MG/ML IV SOLN
2.0000 mg | INTRAVENOUS | Status: DC | PRN
  Administered 2020-02-29 – 2020-03-01 (×2): 2 mg via INTRAVENOUS
  Filled 2020-02-29 (×2): qty 1

## 2020-02-29 MED ORDER — SODIUM CHLORIDE 0.45 % IV SOLN: INTRAVENOUS | Status: DC

## 2020-02-29 MED ORDER — SODIUM CHLORIDE 0.9 % IV SOLN
500.0000 mg | INTRAVENOUS | Status: DC
  Administered 2020-02-29: 01:00:00 500 mg via INTRAVENOUS
  Filled 2020-02-29: qty 500

## 2020-02-29 MED ORDER — ONDANSETRON HCL 4 MG PO TABS: 4.0000 mg | ORAL_TABLET | Freq: Four times a day (QID) | ORAL | Status: DC | PRN

## 2020-02-29 NOTE — TOC Initial Note (Signed)
Transition of Care [facility]) - Initial/Assessment Note    Patient Details  Name: Matthew Boyd MRN: [id number] Date of Birth: Feb 19, 1934  Transition of Care [facility]) CM/SW Contact:    Matthew Boyd, LCSWA Phone Number: 02/29/2020, 4:53 PM  Clinical Narrative:                 Pt admitted due to hypernatremia. TOC consulted for residential hospice placement. CSW spoke with pts wife Matthew Boyd [phone] about choice in residential hospice facility. Matthew Boyd would like for referral to be made to [facility]. CSW spoke to Matthew Boyd with Hospice who states that she will put pt on list for tmrw 12/28 however, that is pending approval. Hospice will call TOC tmrw to f/u. If pt approved he will need COVID test before d/c. CSW initial referral sent to Hospice in [place]. MD updated. TOC to follow.   Expected Discharge Plan: Hospice Medical Facility Barriers to Discharge: Continued Medical Work up   Patient Goals and CMS Choice Patient states their goals for this hospitalization and ongoing recovery are:: Go to [employer]gov Compare Post Acute Care list provided to:: Patient Represenative (must comment) (Patients wife.) Choice offered to / list presented to : Spouse  Expected Discharge Plan and Services Expected Discharge Plan: Hospice Medical Facility In-house Referral: Clinical Social Work Discharge Planning Services: CM Consult Post Acute Care Choice: Hospice Living arrangements for the past 2 months: Skilled Nursing Facility                 DME Arranged: N/A DME Agency: NA       HH Arranged: NA HH Agency: NA        Prior Living Arrangements/Services Living arrangements for the past 2 months: Skilled Nursing Facility Lives with:: Spouse Patient language and need for interpreter reviewed:: Yes Do you feel safe going back to the place where you live?: Yes      Need for Family Participation in Patient Care: Yes  (Comment) Care giver support system in place?: Yes (comment) Current home services: DME (rolling walker and cane) Criminal Activity/Legal Involvement Pertinent to Current Situation/Hospitalization: No - Comment as needed  Activities of Daily Living      Permission Sought/Granted                  Emotional Assessment Appearance:: Appears older than stated age Attitude/Demeanor/Rapport: Unable to Assess Affect (typically observed): Unable to Assess   Alcohol / Substance Use: Not Applicable Psych Involvement: No (comment)  Admission diagnosis:  Hypernatremia [E87.0] Pneumonia [J18.9] Patient Active Problem List   Diagnosis Date Noted  . CAP (community acquired pneumonia) 02/29/2020  . Pneumonia 02/29/2020  . Hypernatremia 02/28/2020  . Macrocytic anemia 02/28/2020  . Occult blood in stools   . Alzheimer's dementia with behavioral disturbance (HCC)   . Chronic gout without tophus   . Generalized weakness 12/18/2019  . GI bleed 12/16/2019  . Acute GI bleeding 12/15/2019  . Rectal bleeding   . Constipation   . Finger pain 06/11/2015  . Acute gout 05/30/2015  . Protein-calorie malnutrition, severe 05/25/2015  . Supratherapeutic INR 05/24/2015  . Acute renal failure superimposed on stage 3 chronic kidney disease (HCC) 05/24/2015  . Atrial fibrillation (HCC) 05/24/2015  . Degenerative joint disease 02/28/2012  . Chronic anticoagulation 02/08/2011  . Arteriosclerotic cardiovascular disease (ASCVD)   . Hypertension   . Hyperlipidemia   . Diabetes mellitus type 2, insulin dependent (HCC)   . Nephrolithiasis   .  Chronic kidney disease   . Tobacco abuse, in remission   . Paroxysmal atrial fibrillation (HCC) 10/03/2009   PCP:  Matthew Grippe, MD Pharmacy:   [facility] - [place], [place] - [phone] [place] [address] Phone: [phone] Fax: [phone]     Social Determinants of Health (SDOH) Interventions     Readmission Risk Interventions Readmission Risk Prevention Plan 12/21/2019  Transportation Screening Complete  PCP or Specialist Appt within 3-5 Days Not Complete  Not Complete comments SNF is the anticipated discharge plan  HRI or Home Care Consult Complete  Social Work Consult for Recovery Care Planning/Counseling Complete  Palliative Care Screening Not Applicable  Medication Review Oceanographer) Complete  Some recent data might be hidden

## 2020-02-29 NOTE — Progress Notes (Signed)
Inpatient Diabetes Program Recommendations  AACE/ADA: New Consensus Statement on Inpatient Glycemic Control (2015)  Target Ranges:  Prepandial:   less than 140 mg/dL      Peak postprandial:   less than 180 mg/dL (1-2 hours)      Critically ill patients:  140 - 180 mg/dL   Lab Results  Component Value Date   GLUCAP 347 (H) 02/29/2020   HGBA1C 6.6 (H) 12/15/2019    Review of Glycemic Control  Diabetes history: DM2 Outpatient Diabetes medications: Tresiba 30 units qd Current orders for Inpatient glycemic control: Novolog 0-9 units tid  Inpatient Diabetes Program Recommendations:   -Add Lantus 15 units qd (50% home insulin dose) Secure chat sent to Dr. Kerry Hough.  Thank you, Billy Fischer. Jakyah Bradby, RN, MSN, CDE  Diabetes Coordinator Inpatient Glycemic Control Team Team Pager [phone] (8am-5pm) 02/29/2020 10:17 AM

## 2020-02-29 NOTE — Progress Notes (Signed)
PROGRESS NOTE    Matthew Boyd  EHO:[id number] DOB: [birth year] DOA: 02/28/2020 PCP: [name], MD    Brief Narrative:  86 year old male with multiple medical problems including advanced Alzheimer's dementia, paroxysmal atrial fibrillation, chronic kidney disease stage III, hypertension, diabetes, who has been residing in long-term care facility for the past 1 month.  Overall condition has declined over the last several weeks.  He is admitted with dehydration, decreased p.o. intake, hypernatremia and community-acquired pneumonia.  Initially started on fluids and antibiotics.  After discussion with patient's wife, he has been transitioned to comfort care with hospice referral.   Assessment & Plan:   Principal Problem:   Hypernatremia Active Problems:   Paroxysmal atrial fibrillation (HCC)   Hypertension   Hyperlipidemia   Diabetes mellitus type 2, insulin dependent (HCC)   Acute renal failure superimposed on stage 3 chronic kidney disease (HCC)   Protein-calorie malnutrition, severe   Alzheimer's dementia with behavioral disturbance (HCC)   Chronic gout without tophus   Macrocytic anemia   CAP (community acquired pneumonia)   Pneumonia   Community-acquired pneumonia -Patient currently on ceftriaxone and azithromycin -Cultures have been sent -Covid test is negative -Continue supplemental oxygen as needed  Acute kidney injury on chronic kidney disease stage III -Secondary to dehydration -Patient has been started on IV fluid resuscitation  Hypernatremia -Secondary to dehydration and diminished p.o. intake -Started on hypotonic fluids  Paroxysmal atrial fibrillation with rapid ventricular response -Anticoagulated with apixaban -Chronically on metoprolol -Beta-blockers have been held in light of low blood pressure secondary to dehydration and volume depletion  Diabetes -On sliding scale insulin  Alzheimer's dementia -Appears to be fairly advanced stages -Often does  not recognize family members -Chronically on Aricept and Seroquel for agitation  Goals of care -Patient noted to be DNR on admission -He is known to me from prior admissions  -This is his third admission since 12/2019 -Overall functional and nutritional status continues to decline -He has been placed on long-term care facility for the past month -P.o. intake remains poor evidenced by hypernatremia/dehydration -Cognitive status has continued to decline and he is not recognizing family hours -Discussed his current state and expected prognosis with his wife -I have suggested to transition the direction of his care towards quality of life/comfort and to consider transitioning to residential hospice -His wife is in agreement with this change -We will consult TOC to help facilitate hospice referral   DVT prophylaxis: none  Code Status: DNR, comfort care Family Communication: discussed with wife at the bedside Disposition Plan: Status is: Inpatient  Remains inpatient appropriate because:Inpatient level of care appropriate due to severity of illness   Dispo: The patient is from: SNF              Anticipated d/c is to: Residential hospice              Anticipated d/c date is: 1 day              Patient currently is not medically stable to d/c.   Consultants:     Procedures:     Antimicrobials:   Ceftriaxone  azithromcyin    Subjective: Confused, not oriented to time or place  Objective: Vitals:   02/29/20 0900 02/29/20 1000 02/29/20 1100 02/29/20 1200  BP: 100/76 101/84 95/62 106/85  Pulse: (!) 112 (!) 111 97 77  Resp: 19 16 20  (!) 23  Temp:      TempSrc:      SpO2: 100% 94% 90% 91%  Weight:      Height:        Intake/Output Summary (Last 24 hours) at 02/29/2020 1533 Last data filed at 02/29/2020 0246 Gross per 24 hour  Intake 1950 ml  Output --  Net 1950 ml   Filed Weights   02/28/20 2114  Weight: 84.4 kg    Examination:  General exam: somnolent,  agitated at times Respiratory system: Clear to auscultation. Respiratory effort normal. Cardiovascular system: S1 & S2 heard, irregular. No JVD, murmurs, rubs, gallops or clicks. No pedal edema. Gastrointestinal system: Abdomen is nondistended, soft and nontender. No organomegaly or masses felt. Normal bowel sounds heard. Central nervous system:  No focal neurological deficits. Extremities: Symmetric 5 x 5 power. Skin: No rashes, lesions or ulcers Psychiatry: somnolent, confused     Data Reviewed: I have personally reviewed following labs and imaging studies  CBC: Recent Labs  Lab 02/28/20 2110 02/29/20 0618  WBC 10.2 9.6  NEUTROABS 9.1* 8.0*  HGB 11.5* 12.1*  HCT 37.9* 41.6  MCV 102.2* 105.1*  PLT 301 299   Basic Metabolic Panel: Recent Labs  Lab 02/28/20 2110 02/29/20 0618  NA 155* 153*  K 4.2 4.6  CL 120* 117*  CO2 22 24  GLUCOSE 349* 368*  BUN 71* 66*  CREATININE 3.04* 2.58*  CALCIUM 8.3* 8.3*   GFR: Estimated Creatinine Clearance: 21.3 mL/min (A) (by C-G formula based on SCr of 2.58 mg/dL (H)). Liver Function Tests: Recent Labs  Lab 02/28/20 2110 02/29/20 0618  AST 19 21  ALT 17 17  ALKPHOS 78 76  BILITOT 0.9 0.6  PROT 7.2 7.1  ALBUMIN 2.4* 2.4*   No results for input(s): LIPASE, AMYLASE in the last 168 hours. No results for input(s): AMMONIA in the last 168 hours. Coagulation Profile: Recent Labs  Lab 02/28/20 2110  INR 1.7*   Cardiac Enzymes: No results for input(s): CKTOTAL, CKMB, CKMBINDEX, TROPONINI in the last 168 hours. BNP (last 3 results) No results for input(s): PROBNP in the last 8760 hours. HbA1C: No results for input(s): HGBA1C in the last 72 hours. CBG: Recent Labs  Lab 02/29/20 0742 02/29/20 1202  GLUCAP 347* 214*   Lipid Profile: No results for input(s): CHOL, HDL, LDLCALC, TRIG, CHOLHDL, LDLDIRECT in the last 72 hours. Thyroid Function Tests: No results for input(s): TSH, T4TOTAL, FREET4, T3FREE, THYROIDAB in the last  72 hours. Anemia Panel: Recent Labs    02/29/20 0618  VITAMINB12 562  FOLATE 6.9  FERRITIN 1,337*  TIBC 122*  IRON 33*  RETICCTPCT 0.8   Sepsis Labs: Recent Labs  Lab 02/28/20 2110  PROCALCITON 2.76  LATICACIDVEN 1.7    Recent Results (from the past 240 hour(s))  Blood Culture (routine x 2)     Status: None (Preliminary result)   Collection Time: 02/28/20  9:10 PM   Specimen: BLOOD LEFT FOREARM  Result Value Ref Range Status   Specimen Description BLOOD LEFT FOREARM  Final   Special Requests   Final    BOTTLES DRAWN AEROBIC AND ANAEROBIC Blood Culture adequate volume   Culture   Final    NO GROWTH < 24 HOURS Performed at [facility], [address] [place], [place] [address]    Report Status PENDING  Incomplete  Blood Culture (routine x 2)     Status: None (Preliminary result)   Collection Time: 02/28/20  9:34 PM   Specimen: BLOOD RIGHT FOREARM  Result Value Ref Range Status   Specimen Description BLOOD RIGHT FOREARM  Final   Special  Requests   Final    BOTTLES DRAWN AEROBIC AND ANAEROBIC Blood Culture results may not be optimal due to an excessive volume of blood received in culture bottles   Culture   Final    NO GROWTH < 24 HOURS Performed at [facility], [address] [place], [place] [address]    Report Status PENDING  Incomplete  Resp Panel by RT-PCR (Flu A&B, Covid) Nasopharyngeal Swab     Status: None   Collection Time: 02/28/20 11:10 PM   Specimen: Nasopharyngeal Swab; Nasopharyngeal(NP) swabs in vial transport medium  Result Value Ref Range Status   SARS Coronavirus 2 by RT PCR NEGATIVE NEGATIVE Final    Comment: (NOTE) SARS-CoV-2 target nucleic acids are NOT DETECTED.  The SARS-CoV-2 RNA is generally detectable in upper respiratory specimens during the acute phase of infection. The lowest concentration of SARS-CoV-2 viral copies this assay can detect is 138 copies/mL. A negative result does not preclude SARS-Cov-2 infection and should not be used  as the sole basis for treatment or other patient management decisions. A negative result may occur with  improper specimen collection/handling, submission of specimen other than nasopharyngeal swab, presence of viral mutation(s) within the areas targeted by this assay, and inadequate number of viral copies(<138 copies/mL). A negative result must be combined with clinical observations, patient history, and epidemiological information. The expected result is Negative.  Fact Sheet for Patients:  BloggerCourse.com  Fact Sheet for Healthcare Providers:  SeriousBroker.it  This test is no t yet approved or cleared by the [place] FDA and  has been authorized for detection and/or diagnosis of SARS-CoV-2 by FDA under an Emergency Use Authorization (EUA). This EUA will remain  in effect (meaning this test can be used) for the duration of the COVID-19 declaration under Section 564(b)(1) of the Act, 21 U.S.C.section 360bbb-3(b)(1), unless the authorization is terminated  or revoked sooner.       Influenza A by PCR NEGATIVE NEGATIVE Final   Influenza B by PCR NEGATIVE NEGATIVE Final    Comment: (NOTE) The Xpert Xpress SARS-CoV-2/FLU/RSV plus assay is intended as an aid in the diagnosis of influenza from Nasopharyngeal swab specimens and should not be used as a sole basis for treatment. Nasal washings and aspirates are unacceptable for Xpert Xpress SARS-CoV-2/FLU/RSV testing.  Fact Sheet for Patients: BloggerCourse.com  Fact Sheet for Healthcare Providers: SeriousBroker.it  This test is not yet approved or cleared by the [place] FDA and has been authorized for detection and/or diagnosis of SARS-CoV-2 by FDA under an Emergency Use Authorization (EUA). This EUA will remain in effect (meaning this test can be used) for the duration of the COVID-19 declaration under Section 564(b)(1)  of the Act, 21 U.S.C. section 360bbb-3(b)(1), unless the authorization is terminated or revoked.  Performed at [facility], [address], [place], [place] [address]          Radiology Studies: CT ABDOMEN PELVIS WO CONTRAST  Result Date: 02/28/2020 CLINICAL DATA:  Abdominal pain, acute, nonlocalized. Question intra-abdominal infection. EXAM: CT ABDOMEN AND PELVIS WITHOUT CONTRAST TECHNIQUE: Multidetector CT imaging of the abdomen and pelvis was performed following the standard protocol without IV contrast. COMPARISON:  CT abdomen and pelvis 05/25/2015 FINDINGS: Lower chest: Focal airspace consolidation is present at the left base. Minimal atelectasis present at the right base. Heart is mildly enlarged. Coronary artery calcifications are evident. No significant pleural or pericardial effusion is present. Hepatobiliary: Gallbladder is distended. Noncontrast liver is within normal limits. Common bile duct is normal. Pancreas:  Unremarkable. No pancreatic ductal dilatation or surrounding inflammatory changes. Spleen: Normal in size without focal abnormality. Adrenals/Urinary Tract: Adrenal glands are normal bilaterally. Kidneys are atrophic. Multiple right-sided stones are new. Least 5 separate stones present. The largest measures 9 mm. No obstruction is present. Central sinus cyst is present in the lower pole of the left kidney, measuring up to 2.5 cm. The ureters are within normal limits bilaterally. The urinary bladder is unremarkable. Stomach/Bowel: Stomach and duodenum are within normal limits. Small bowel is unremarkable. Terminal ileum is within normal limits. Appendix is visualized and normal. Ascending transverse colon are within normal limits. Descending and sigmoid colon are unremarkable. Vascular/Lymphatic: Atherosclerotic calcifications are present in the aorta branch vessels without aneurysm or significant change. No significant adenopathy is present. Reproductive: Prostate is unremarkable.  Other: Paraumbilical hernia contains fat without bowel. No other significant ventral hernia is present. No free fluid or free air is present. Musculoskeletal: Bilateral pars defects are present. Associated slight retrolisthesis at L4-5 and grade 1 anterolisthesis at L5-S1 is stable. Slight retrolisthesis at L2-3 is also stable. No focal lytic or blastic lesions are present. SI joints are fused. Hips are located and within normal limits. IMPRESSION: 1. Left lower lobe airspace disease concerning for pneumonia. 2. Multiple nonobstructing right-sided nephrolithiasis. 3. No other acute or focal lesion to explain the patient's abdominal pain. 4. Coronary artery disease. 5. 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 6. Aortic Atherosclerosis (ICD10-I70.0). Electronically Signed   By: Marin Roberts M.D.   On: 02/28/2020 23:17   DG Chest Port 1 View  Result Date: 02/28/2020 CLINICAL DATA:  Apnea with tachycardia and hypotension. EXAM: PORTABLE CHEST 1 VIEW COMPARISON:  December 19, 2019 FINDINGS: Mild, diffuse chronic appearing increased interstitial lung markings are seen. There is no evidence of acute infiltrate, pleural effusion or pneumothorax. The heart size and mediastinal contours are within normal limits. Degenerative changes seen throughout the thoracic spine. IMPRESSION: No active disease. Electronically Signed   By: Aram Candela M.D.   On: 02/28/2020 21:39        Scheduled Meds: . insulin aspart  0-9 Units Subcutaneous TID WC   Continuous Infusions: . sodium chloride 150 mL/hr at 02/29/20 1248     LOS: 0 days    Time spent:    Erick Blinks, MD [facility]   If 7PM-7AM, please contact night-coverage [email or web address]  02/29/2020, 3:33 PM

## 2020-02-29 NOTE — ED Notes (Signed)
Hospitalist at bedside 

## 2020-03-01 DIAGNOSIS — L8990 Pressure ulcer of unspecified site, unspecified stage: Secondary | ICD-10-CM | POA: Insufficient documentation

## 2020-03-01 DIAGNOSIS — Z794 Long term (current) use of insulin: Secondary | ICD-10-CM

## 2020-03-01 DIAGNOSIS — Z515 Encounter for palliative care: Secondary | ICD-10-CM

## 2020-03-01 DIAGNOSIS — E119 Type 2 diabetes mellitus without complications: Secondary | ICD-10-CM

## 2020-03-01 LAB — URINE CULTURE: Culture: <10000 — AB

## 2020-03-01 LAB — RESP PANEL BY RT-PCR (FLU A&B, COVID) ARPGX2
Influenza A by PCR: NEGATIVE
Influenza B by PCR: NEGATIVE
SARS Coronavirus 2 by RT PCR: NEGATIVE

## 2020-03-01 MED ORDER — LORAZEPAM 1 MG PO TABS: 1.0000 mg | ORAL_TABLET | ORAL | 0 refills | Status: AC | PRN

## 2020-03-01 MED ORDER — MORPHINE SULFATE (CONCENTRATE) 10 MG /0.5 ML PO SOLN: 10.0000 mg | ORAL | 0 refills | Status: AC | PRN

## 2020-03-01 NOTE — Progress Notes (Signed)
Morphine given pt SOB and moaning

## 2020-03-01 NOTE — TOC Transition Note (Signed)
Transition of Care [facility]) - CM/SW Discharge Note   Patient Details  Name: Matthew Boyd MRN: [id number] Date of Birth: 05/06/1933  [facility]) CM/SW Contact:  Leitha Bleak, RN Phone Number: 03/01/2020, 3:22 PM   Clinical Narrative:   Hospice House has a bed. TOC called Bonita Quin - wife, she is good with him going today. He needs a COVID test. They can take him later. RN updated to call report.     Final next level of care: Hospice Medical Facility Barriers to Discharge: Continued Medical Work up   Patient Goals and CMS Choice Patient states their goals for this hospitalization and ongoing recovery are:: Go to [employer] Compare Post Acute Care list provided to:: Patient Represenative (must comment) (Patients wife.) Choice offered to / list presented to : Spouse  Discharge Placement              Patient chooses bed at: Other - please specify in the comment section below: [facility]) Patient to be transferred to facility by: EMS Name of family member notified: [name] Patient and family notified of of transfer: 03/01/20  Discharge Plan and Services In-house Referral: Clinical Social Work Discharge Planning Services: CM Consult Post Acute Care Choice: Hospice          DME Arranged: N/A DME Agency: NA    HH Arranged: NA HH Agency: NA     Readmission Risk Interventions Readmission Risk Prevention Plan 03/01/2020 12/21/2019  Transportation Screening Complete Complete  PCP or Specialist Appt within 3-5 Days - Not Complete  Not Complete comments - SNF is the anticipated discharge plan  HRI or Home Care Consult - Complete  Social Work Consult for Recovery Care Planning/Counseling - Complete  Palliative Care Screening - Not Applicable  Medication Review Oceanographer) Complete Complete  PCP or Specialist appointment within 3-5 days of discharge Complete -  HRI or Home Care Consult Complete -  SW Recovery Care/Counseling Consult  Complete -  Palliative Care Screening Complete -  Skilled Nursing Facility Not Applicable -  Some recent data might be hidden

## 2020-03-01 NOTE — Discharge Summary (Signed)
Physician Discharge Summary  MAXIMILIEN HAYASHI ZOX:[id number] DOB: 11-25-1933 DOA: 02/28/2020  PCP: [name], MD  Admit date: 02/28/2020 Discharge date: 03/01/2020  Admitted From: Skilled nursing facility Disposition: Residential hospice  Recommendations for Outpatient Follow-up:  1. Patient is being discharged to residential hospice for end-of-life care  Discharge Condition: Terminal CODE STATUS: DNR, comfort measures Diet recommendation: Regular diet for comfort  Brief/Interim Summary: 86 year old male with a history of advanced Alzheimer's dementia, paroxysmal atrial fibrillation, chronic kidney disease stage III, hypertension, diabetes, who has been residing in a long-term care facility for the past 1 month.  Over the last several weeks, his overall condition has continued to decline.  He was admitted to the hospital with dehydration, decreased p.o. intake, hypernatremia and community-acquired pneumonia.  Patient was started on intravenous fluids and antibiotics.  After long discussion with the patient's wife, she did acknowledge that he is a DNR and agreed that currently, the care that he is receiving is not conducive to his quality of life.  She agreed to transition patient towards comfort care and with hospice referral.  Patient was evaluated by hospice and felt appropriate for residential hospice.  He will be discharged to residential hospice today for end-of-life care.  Discharge Diagnoses:  Principal Problem:   Hypernatremia Active Problems:   Paroxysmal atrial fibrillation (HCC)   Hypertension   Hyperlipidemia   Diabetes mellitus type 2, insulin dependent (HCC)   Acute renal failure superimposed on stage 3 chronic kidney disease (HCC)   Protein-calorie malnutrition, severe   Alzheimer's dementia with behavioral disturbance (HCC)   Chronic gout without tophus   Macrocytic anemia   CAP (community acquired pneumonia)   Pneumonia   Pressure injury of skin   Hospice care  patient    Discharge Instructions  Discharge Instructions    Diet - low sodium heart healthy   Complete by: As directed    Increase activity slowly   Complete by: As directed    No wound care   Complete by: As directed      Allergies as of 03/01/2020   No Known Allergies     Medication List    STOP taking these medications   acetaminophen 325 MG tablet Commonly known as: TYLENOL   allopurinol 100 MG tablet Commonly known as: Zyloprim   amLODipine 2.5 MG tablet Commonly known as: NORVASC   apixaban 2.5 MG Tabs tablet Commonly known as: ELIQUIS   CALCIUM 500 PO   donepezil 10 MG tablet Commonly known as: ARICEPT   hydrocortisone 25 MG suppository Commonly known as: ANUSOL-HC   MELATONIN PO   metoprolol tartrate 50 MG tablet Commonly known as: LOPRESSOR   pantoprazole 40 MG tablet Commonly known as: PROTONIX   QUEtiapine 25 MG tablet Commonly known as: SEROQUEL   Tresiba FlexTouch 100 UNIT/ML FlexTouch Pen Generic drug: insulin degludec     TAKE these medications   LORazepam 1 MG tablet Commonly known as: ATIVAN Place 1 tablet (1 mg total) under the tongue every 4 (four) hours as needed for anxiety.   morphine CONCENTRATE 10 mg / 0.5 ml concentrated solution Place 0.5 mLs (10 mg total) under the tongue every 2 (two) hours as needed for severe pain.       No Known Allergies  Consultations:     Procedures/Studies: CT ABDOMEN PELVIS WO CONTRAST  Result Date: 02/28/2020 CLINICAL DATA:  Abdominal pain, acute, nonlocalized. Question intra-abdominal infection. EXAM: CT ABDOMEN AND PELVIS WITHOUT CONTRAST TECHNIQUE: Multidetector CT imaging of the abdomen and pelvis  was performed following the standard protocol without IV contrast. COMPARISON:  CT abdomen and pelvis 05/25/2015 FINDINGS: Lower chest: Focal airspace consolidation is present at the left base. Minimal atelectasis present at the right base. Heart is mildly enlarged. Coronary artery  calcifications are evident. No significant pleural or pericardial effusion is present. Hepatobiliary: Gallbladder is distended. Noncontrast liver is within normal limits. Common bile duct is normal. Pancreas: Unremarkable. No pancreatic ductal dilatation or surrounding inflammatory changes. Spleen: Normal in size without focal abnormality. Adrenals/Urinary Tract: Adrenal glands are normal bilaterally. Kidneys are atrophic. Multiple right-sided stones are new. Least 5 separate stones present. The largest measures 9 mm. No obstruction is present. Central sinus cyst is present in the lower pole of the left kidney, measuring up to 2.5 cm. The ureters are within normal limits bilaterally. The urinary bladder is unremarkable. Stomach/Bowel: Stomach and duodenum are within normal limits. Small bowel is unremarkable. Terminal ileum is within normal limits. Appendix is visualized and normal. Ascending transverse colon are within normal limits. Descending and sigmoid colon are unremarkable. Vascular/Lymphatic: Atherosclerotic calcifications are present in the aorta branch vessels without aneurysm or significant change. No significant adenopathy is present. Reproductive: Prostate is unremarkable. Other: Paraumbilical hernia contains fat without bowel. No other significant ventral hernia is present. No free fluid or free air is present. Musculoskeletal: Bilateral pars defects are present. Associated slight retrolisthesis at L4-5 and grade 1 anterolisthesis at L5-S1 is stable. Slight retrolisthesis at L2-3 is also stable. No focal lytic or blastic lesions are present. SI joints are fused. Hips are located and within normal limits. IMPRESSION: 1. Left lower lobe airspace disease concerning for pneumonia. 2. Multiple nonobstructing right-sided nephrolithiasis. 3. No other acute or focal lesion to explain the patient's abdominal pain. 4. Coronary artery disease. 5. 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 6. Aortic Atherosclerosis (ICD10-I70.0). Electronically Signed   By: Marin Roberts M.D.   On: 02/28/2020 23:17   DG Chest Port 1 View  Result Date: 02/28/2020 CLINICAL DATA:  Apnea with tachycardia and hypotension. EXAM: PORTABLE CHEST 1 VIEW COMPARISON:  December 19, 2019 FINDINGS: Mild, diffuse chronic appearing increased interstitial lung markings are seen. There is no evidence of acute infiltrate, pleural effusion or pneumothorax. The heart size and mediastinal contours are within normal limits. Degenerative changes seen throughout the thoracic spine. IMPRESSION: No active disease. Electronically Signed   By: Aram Candela M.D.   On: 02/28/2020 21:39       Subjective: Patient is unresponsive, does not answer questions or follow commands, he is moaning  Discharge Exam: Vitals:   02/29/20 1843 02/29/20 2015 02/29/20 2017 03/01/20 0638  BP: (!) 136/100  (!) 97/55 (!) 117/103  Pulse: 76  77 (!) 111  Resp: 20  18 20   Temp: (!) 97.2 F (36.2 C)  (!) 97.2 F (36.2 C) 98 F (36.7 C)  TempSrc:      SpO2: (!) 86% 98% 98% 93%  Weight:      Height:        General: Unresponsive, appears agitated Cardiovascular: Irregular, S1/S2 +, no rubs, no gallops Respiratory: CTA bilaterally, no wheezing, no rhonchi Abdominal: Soft, NT, ND, bowel sounds + Extremities: no edema, no cyanosis    The results of significant diagnostics from this hospitalization (including imaging, microbiology, ancillary and laboratory) are listed below for reference.     Microbiology: Recent Results (from the past 240 hour(s))  Blood Culture (routine x 2)  Status: None (Preliminary result)   Collection Time: 02/28/20  9:10 PM   Specimen: BLOOD LEFT FOREARM  Result Value Ref Range Status   Specimen Description BLOOD LEFT FOREARM  Final   Special Requests   Final    BOTTLES DRAWN AEROBIC AND ANAEROBIC Blood Culture adequate volume   Culture   Final    NO GROWTH 2  DAYS Performed at [facility], [address] [place], [place] [address]    Report Status PENDING  Incomplete  Blood Culture (routine x 2)     Status: None (Preliminary result)   Collection Time: 02/28/20  9:34 PM   Specimen: BLOOD RIGHT FOREARM  Result Value Ref Range Status   Specimen Description BLOOD RIGHT FOREARM  Final   Special Requests   Final    BOTTLES DRAWN AEROBIC AND ANAEROBIC Blood Culture results may not be optimal due to an excessive volume of blood received in culture bottles   Culture   Final    NO GROWTH 2 DAYS Performed at [facility], [address] [place], [place] [address]    Report Status PENDING  Incomplete  Urine culture     Status: Abnormal   Collection Time: 02/28/20 11:10 PM   Specimen: In/Out Cath Urine  Result Value Ref Range Status   Specimen Description   Final    IN/OUT CATH URINE Performed at [facility], [address] [place], [place] [address]    Special Requests   Final    NONE Performed at [facility], [address] [place], [place] [address]    Culture (A)  Final    <10,000 COLONIES/mL INSIGNIFICANT GROWTH Performed at [facility], [address] [place], [place] [address]    Report Status 03/01/2020 FINAL  Final  Resp Panel by RT-PCR (Flu A&B, Covid) Nasopharyngeal Swab     Status: None   Collection Time: 02/28/20 11:10 PM   Specimen: Nasopharyngeal Swab; Nasopharyngeal(NP) swabs in vial transport medium  Result Value Ref Range Status   SARS Coronavirus 2 by RT PCR NEGATIVE NEGATIVE Final    Comment: (NOTE) SARS-CoV-2 target nucleic acids are NOT DETECTED.  The SARS-CoV-2 RNA is generally detectable in upper respiratory specimens during the acute phase of infection. The lowest concentration of SARS-CoV-2 viral copies this assay can detect is 138 copies/mL. A negative result does not preclude SARS-Cov-2 infection and should not be used as the sole basis for treatment or other patient management decisions. A negative  result may occur with  improper specimen collection/handling, submission of specimen other than nasopharyngeal swab, presence of viral mutation(s) within the areas targeted by this assay, and inadequate number of viral copies(<138 copies/mL). A negative result must be combined with clinical observations, patient history, and epidemiological information. The expected result is Negative.  Fact Sheet for Patients:  BloggerCourse.com  Fact Sheet for Healthcare Providers:  SeriousBroker.it  This test is no t yet approved or cleared by the [place] FDA and  has been authorized for detection and/or diagnosis of SARS-CoV-2 by FDA under an Emergency Use Authorization (EUA). This EUA will remain  in effect (meaning this test can be used) for the duration of the COVID-19 declaration under Section 564(b)(1) of the Act, 21 U.S.C.section 360bbb-3(b)(1), unless the authorization is terminated  or revoked sooner.       Influenza A by PCR NEGATIVE NEGATIVE Final   Influenza B by PCR NEGATIVE NEGATIVE Final    Comment: (NOTE) The Xpert Xpress SARS-CoV-2/FLU/RSV plus assay is intended as  an aid in the diagnosis of influenza from Nasopharyngeal swab specimens and should not be used as a sole basis for treatment. Nasal washings and aspirates are unacceptable for Xpert Xpress SARS-CoV-2/FLU/RSV testing.  Fact Sheet for Patients: BloggerCourse.comhttps://www.fda.gov/media/152166/download  Fact Sheet for Healthcare Providers: SeriousBroker.ithttps://www.fda.gov/media/152162/download  This test is not yet approved or cleared by the [place] FDA and has been authorized for detection and/or diagnosis of SARS-CoV-2 by FDA under an Emergency Use Authorization (EUA). This EUA will remain in effect (meaning this test can be used) for the duration of the COVID-19 declaration under Section 564(b)(1) of the Act, 21 U.S.C. section 360bbb-3(b)(1), unless the authorization is  terminated or revoked.  Performed at [facility], [address] [place], [place] [address]      Labs: BNP (last 3 results) No results for input(s): BNP in the last 8760 hours. Basic Metabolic Panel: Recent Labs  Lab 02/28/20 2110 02/29/20 0618  NA 155* 153*  K 4.2 4.6  CL 120* 117*  CO2 22 24  GLUCOSE 349* 368*  BUN 71* 66*  CREATININE 3.04* 2.58*  CALCIUM 8.3* 8.3*   Liver Function Tests: Recent Labs  Lab 02/28/20 2110 02/29/20 0618  AST 19 21  ALT 17 17  ALKPHOS 78 76  BILITOT 0.9 0.6  PROT 7.2 7.1  ALBUMIN 2.4* 2.4*   No results for input(s): LIPASE, AMYLASE in the last 168 hours. No results for input(s): AMMONIA in the last 168 hours. CBC: Recent Labs  Lab 02/28/20 2110 02/29/20 0618  WBC 10.2 9.6  NEUTROABS 9.1* 8.0*  HGB 11.5* 12.1*  HCT 37.9* 41.6  MCV 102.2* 105.1*  PLT 301 299   Cardiac Enzymes: No results for input(s): CKTOTAL, CKMB, CKMBINDEX, TROPONINI in the last 168 hours. BNP: Invalid input(s): POCBNP CBG: Recent Labs  Lab 02/29/20 0742 02/29/20 1202 02/29/20 1614  GLUCAP 347* 214* 221*   D-Dimer No results for input(s): DDIMER in the last 72 hours. Hgb A1c No results for input(s): HGBA1C in the last 72 hours. Lipid Profile No results for input(s): CHOL, HDL, LDLCALC, TRIG, CHOLHDL, LDLDIRECT in the last 72 hours. Thyroid function studies No results for input(s): TSH, T4TOTAL, T3FREE, THYROIDAB in the last 72 hours.  Invalid input(s): FREET3 Anemia work up Recent Labs    02/29/20 0618  VITAMINB12 562  FOLATE 6.9  FERRITIN 1,337*  TIBC 122*  IRON 33*  RETICCTPCT 0.8   Urinalysis    Component Value Date/Time   COLORURINE YELLOW 02/28/2020 2310   APPEARANCEUR HAZY (A) 02/28/2020 2310   LABSPEC 1.019 02/28/2020 2310   PHURINE 5.0 02/28/2020 2310   GLUCOSEU 50 (A) 02/28/2020 2310   HGBUR MODERATE (A) 02/28/2020 2310   BILIRUBINUR NEGATIVE 02/28/2020 2310   KETONESUR 5 (A) 02/28/2020 2310   PROTEINUR 30 (A)  02/28/2020 2310   NITRITE NEGATIVE 02/28/2020 2310   LEUKOCYTESUR TRACE (A) 02/28/2020 2310   Sepsis Labs Invalid input(s): PROCALCITONIN,  WBC,  LACTICIDVEN Microbiology Recent Results (from the past 240 hour(s))  Blood Culture (routine x 2)     Status: None (Preliminary result)   Collection Time: 02/28/20  9:10 PM   Specimen: BLOOD LEFT FOREARM  Result Value Ref Range Status   Specimen Description BLOOD LEFT FOREARM  Final   Special Requests   Final    BOTTLES DRAWN AEROBIC AND ANAEROBIC Blood Culture adequate volume   Culture   Final    NO GROWTH 2 DAYS Performed at [facility], [address] [place], [place] [address]  Report Status PENDING  Incomplete  Blood Culture (routine x 2)     Status: None (Preliminary result)   Collection Time: 02/28/20  9:34 PM   Specimen: BLOOD RIGHT FOREARM  Result Value Ref Range Status   Specimen Description BLOOD RIGHT FOREARM  Final   Special Requests   Final    BOTTLES DRAWN AEROBIC AND ANAEROBIC Blood Culture results may not be optimal due to an excessive volume of blood received in culture bottles   Culture   Final    NO GROWTH 2 DAYS Performed at [facility], [address] [place], [place] [address]    Report Status PENDING  Incomplete  Urine culture     Status: Abnormal   Collection Time: 02/28/20 11:10 PM   Specimen: In/Out Cath Urine  Result Value Ref Range Status   Specimen Description   Final    IN/OUT CATH URINE Performed at [facility], [address], [place], [place] [address]    Special Requests   Final    NONE Performed at [facility], [address], [place], [place] [address]    Culture (A)  Final    <10,000 COLONIES/mL INSIGNIFICANT GROWTH Performed at [facility], [address], [place], [place] [address]    Report Status 03/01/2020 FINAL  Final  Resp Panel by RT-PCR (Flu A&B, Covid) Nasopharyngeal Swab     Status: None   Collection Time: 02/28/20 11:10 PM   Specimen: Nasopharyngeal Swab;  Nasopharyngeal(NP) swabs in vial transport medium  Result Value Ref Range Status   SARS Coronavirus 2 by RT PCR NEGATIVE NEGATIVE Final    Comment: (NOTE) SARS-CoV-2 target nucleic acids are NOT DETECTED.  The SARS-CoV-2 RNA is generally detectable in upper respiratory specimens during the acute phase of infection. The lowest concentration of SARS-CoV-2 viral copies this assay can detect is 138 copies/mL. A negative result does not preclude SARS-Cov-2 infection and should not be used as the sole basis for treatment or other patient management decisions. A negative result may occur with  improper specimen collection/handling, submission of specimen other than nasopharyngeal swab, presence of viral mutation(s) within the areas targeted by this assay, and inadequate number of viral copies(<138 copies/mL). A negative result must be combined with clinical observations, patient history, and epidemiological information. The expected result is Negative.  Fact Sheet for Patients:  BloggerCourse.com  Fact Sheet for Healthcare Providers:  SeriousBroker.it  This test is no t yet approved or cleared by the [place] FDA and  has been authorized for detection and/or diagnosis of SARS-CoV-2 by FDA under an Emergency Use Authorization (EUA). This EUA will remain  in effect (meaning this test can be used) for the duration of the COVID-19 declaration under Section 564(b)(1) of the Act, 21 U.S.C.section 360bbb-3(b)(1), unless the authorization is terminated  or revoked sooner.       Influenza A by PCR NEGATIVE NEGATIVE Final   Influenza B by PCR NEGATIVE NEGATIVE Final    Comment: (NOTE) The Xpert Xpress SARS-CoV-2/FLU/RSV plus assay is intended as an aid in the diagnosis of influenza from Nasopharyngeal swab specimens and should not be used as a sole basis for treatment. Nasal washings and aspirates are unacceptable for Xpert Xpress  SARS-CoV-2/FLU/RSV testing.  Fact Sheet for Patients: BloggerCourse.com  Fact Sheet for Healthcare Providers: SeriousBroker.it  This test is not yet approved or cleared by the [place] FDA and has been authorized for detection and/or diagnosis of SARS-CoV-2 by FDA under an Emergency Use Authorization (EUA). This  EUA will remain in effect (meaning this test can be used) for the duration of the COVID-19 declaration under Section 564(b)(1) of the Act, 21 U.S.C. section 360bbb-3(b)(1), unless the authorization is terminated or revoked.  Performed at [facility], [address], [place], [place] [address]      Time coordinating discharge:  SIGNED:   Erick Blinks, MD  [facility] 03/01/2020, 2:55 PM   If 7PM-7AM, please contact night-coverage [email or web address]

## 2020-03-01 NOTE — Progress Notes (Signed)
Nutrition Brief Note  RD working remotely.  Chart reviewed.  Pt has transitioned to comfort care and is pending transfer to hospice facility.  No nutrition interventions warranted at this time.   Please re-consult as needed.   Lars Masson, RD, LDN Clinical Nutrition After Hours/Weekend Pager # in Amion

## 2020-03-04 LAB — CULTURE, BLOOD (ROUTINE X 2)
Culture: NO GROWTH
Culture: NO GROWTH
Special Requests: ADEQUATE

## 2020-03-05 DEATH — deceased

## 2020-08-16 ENCOUNTER — Ambulatory Visit: Payer: Medicare Other | Admitting: Cardiology
